# Patient Record
Sex: Male | Born: 1980
Health system: Southern US, Community
[De-identification: ages and names within clinical notes are randomized; demographics above are authoritative.]

## PROBLEM LIST (undated history)

## (undated) ENCOUNTER — Emergency Department (HOSPITAL_BASED_OUTPATIENT_CLINIC_OR_DEPARTMENT_OTHER): Discharge status: Absent without leave | Payer: Self-pay

## (undated) DIAGNOSIS — G4730 Sleep apnea, unspecified: Secondary | ICD-10-CM

## (undated) DIAGNOSIS — G8929 Other chronic pain: Secondary | ICD-10-CM

## (undated) DIAGNOSIS — I82409 Acute embolism and thrombosis of unspecified deep veins of unspecified lower extremity: Secondary | ICD-10-CM

## (undated) DIAGNOSIS — F209 Schizophrenia, unspecified: Secondary | ICD-10-CM

## (undated) DIAGNOSIS — F319 Bipolar disorder, unspecified: Secondary | ICD-10-CM

## (undated) DIAGNOSIS — M25512 Pain in left shoulder: Secondary | ICD-10-CM

## (undated) DIAGNOSIS — C6290 Malignant neoplasm of unspecified testis, unspecified whether descended or undescended: Secondary | ICD-10-CM

## (undated) DIAGNOSIS — J45909 Unspecified asthma, uncomplicated: Secondary | ICD-10-CM

## (undated) HISTORY — PX: APPENDECTOMY: SHX54

## (undated) HISTORY — PX: FACIAL COSMETIC SURGERY: SHX629

---
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
## 2001-04-17 ENCOUNTER — Encounter: Payer: Self-pay | Admitting: Emergency Medicine

## 2001-04-17 ENCOUNTER — Emergency Department (HOSPITAL_COMMUNITY): Admission: EM | Admit: 2001-04-17 | Discharge: 2001-04-17 | Payer: Self-pay | Admitting: Emergency Medicine

## 2001-08-30 ENCOUNTER — Emergency Department (HOSPITAL_COMMUNITY): Admission: EM | Admit: 2001-08-30 | Discharge: 2001-08-30 | Payer: Self-pay | Admitting: Emergency Medicine

## 2001-11-07 ENCOUNTER — Encounter: Payer: Self-pay | Admitting: Emergency Medicine

## 2001-11-07 ENCOUNTER — Emergency Department (HOSPITAL_COMMUNITY): Admission: EM | Admit: 2001-11-07 | Discharge: 2001-11-07 | Payer: Self-pay | Admitting: Emergency Medicine

## 2001-11-09 ENCOUNTER — Inpatient Hospital Stay (HOSPITAL_COMMUNITY): Admission: RE | Admit: 2001-11-09 | Discharge: 2001-11-10 | Payer: Self-pay | Admitting: Oral & Maxillofacial Surgery

## 2001-11-10 ENCOUNTER — Encounter: Payer: Self-pay | Admitting: Oral & Maxillofacial Surgery

## 2002-02-17 ENCOUNTER — Encounter: Payer: Self-pay | Admitting: Emergency Medicine

## 2002-02-17 ENCOUNTER — Emergency Department (HOSPITAL_COMMUNITY): Admission: EM | Admit: 2002-02-17 | Discharge: 2002-02-17 | Payer: Self-pay | Admitting: Emergency Medicine

## 2002-03-07 ENCOUNTER — Encounter: Admission: RE | Admit: 2002-03-07 | Discharge: 2002-03-07 | Payer: Self-pay | Admitting: Nephrology

## 2002-03-07 ENCOUNTER — Encounter: Payer: Self-pay | Admitting: Nephrology

## 2002-03-10 ENCOUNTER — Encounter: Admission: RE | Admit: 2002-03-10 | Discharge: 2002-04-21 | Payer: Self-pay | Admitting: Orthopaedic Surgery

## 2002-07-12 ENCOUNTER — Encounter: Payer: Self-pay | Admitting: Nephrology

## 2002-07-12 ENCOUNTER — Encounter: Admission: RE | Admit: 2002-07-12 | Discharge: 2002-07-12 | Payer: Self-pay | Admitting: Nephrology

## 2002-08-12 ENCOUNTER — Encounter: Payer: Self-pay | Admitting: Orthopedic Surgery

## 2002-08-12 ENCOUNTER — Encounter: Admission: RE | Admit: 2002-08-12 | Discharge: 2002-08-12 | Payer: Self-pay | Admitting: Orthopedic Surgery

## 2002-09-01 ENCOUNTER — Encounter: Admission: RE | Admit: 2002-09-01 | Discharge: 2002-09-01 | Payer: Self-pay | Admitting: Nephrology

## 2002-09-01 ENCOUNTER — Encounter: Payer: Self-pay | Admitting: Nephrology

## 2003-01-19 ENCOUNTER — Emergency Department (HOSPITAL_COMMUNITY): Admission: EM | Admit: 2003-01-19 | Discharge: 2003-01-20 | Payer: Self-pay | Admitting: Emergency Medicine

## 2003-01-19 ENCOUNTER — Encounter: Payer: Self-pay | Admitting: Emergency Medicine

## 2004-03-24 ENCOUNTER — Emergency Department (HOSPITAL_COMMUNITY): Admission: EM | Admit: 2004-03-24 | Discharge: 2004-03-25 | Payer: Self-pay | Admitting: Emergency Medicine

## 2004-09-17 ENCOUNTER — Emergency Department (HOSPITAL_COMMUNITY): Admission: EM | Admit: 2004-09-17 | Discharge: 2004-09-18 | Payer: Self-pay | Admitting: Emergency Medicine

## 2004-10-09 ENCOUNTER — Emergency Department (HOSPITAL_COMMUNITY): Admission: EM | Admit: 2004-10-09 | Discharge: 2004-10-09 | Payer: Self-pay | Admitting: Emergency Medicine

## 2004-12-29 ENCOUNTER — Emergency Department (HOSPITAL_COMMUNITY): Admission: EM | Admit: 2004-12-29 | Discharge: 2004-12-29 | Payer: Self-pay | Admitting: Emergency Medicine

## 2005-09-21 ENCOUNTER — Emergency Department (HOSPITAL_COMMUNITY): Admission: EM | Admit: 2005-09-21 | Discharge: 2005-09-21 | Payer: Self-pay | Admitting: *Deleted

## 2005-10-27 ENCOUNTER — Emergency Department (HOSPITAL_COMMUNITY): Admission: EM | Admit: 2005-10-27 | Discharge: 2005-10-27 | Payer: Self-pay | Admitting: Emergency Medicine

## 2005-10-28 ENCOUNTER — Emergency Department (HOSPITAL_COMMUNITY): Admission: EM | Admit: 2005-10-28 | Discharge: 2005-10-28 | Payer: Self-pay | Admitting: Emergency Medicine

## 2005-12-28 ENCOUNTER — Emergency Department (HOSPITAL_COMMUNITY): Admission: EM | Admit: 2005-12-28 | Discharge: 2005-12-28 | Payer: Self-pay | Admitting: Emergency Medicine

## 2006-03-04 ENCOUNTER — Emergency Department (HOSPITAL_COMMUNITY): Admission: EM | Admit: 2006-03-04 | Discharge: 2006-03-04 | Payer: Self-pay | Admitting: Emergency Medicine

## 2006-09-14 ENCOUNTER — Emergency Department (HOSPITAL_COMMUNITY): Admission: EM | Admit: 2006-09-14 | Discharge: 2006-09-14 | Payer: Self-pay | Admitting: Family Medicine

## 2006-09-14 ENCOUNTER — Emergency Department (HOSPITAL_COMMUNITY): Admission: EM | Admit: 2006-09-14 | Discharge: 2006-09-14 | Payer: Self-pay | Admitting: Emergency Medicine

## 2006-11-25 ENCOUNTER — Emergency Department (HOSPITAL_COMMUNITY): Admission: EM | Admit: 2006-11-25 | Discharge: 2006-11-25 | Payer: Self-pay | Admitting: Emergency Medicine

## 2007-07-02 ENCOUNTER — Emergency Department (HOSPITAL_COMMUNITY): Admission: EM | Admit: 2007-07-02 | Discharge: 2007-07-02 | Payer: Self-pay | Admitting: Emergency Medicine

## 2007-07-18 ENCOUNTER — Emergency Department (HOSPITAL_COMMUNITY): Admission: EM | Admit: 2007-07-18 | Discharge: 2007-07-18 | Payer: Self-pay | Admitting: Emergency Medicine

## 2007-08-20 ENCOUNTER — Emergency Department (HOSPITAL_COMMUNITY): Admission: EM | Admit: 2007-08-20 | Discharge: 2007-08-20 | Payer: Self-pay | Admitting: Emergency Medicine

## 2010-04-14 ENCOUNTER — Emergency Department (HOSPITAL_COMMUNITY): Admission: EM | Admit: 2010-04-14 | Discharge: 2010-04-14 | Payer: Self-pay | Admitting: Emergency Medicine

## 2010-05-03 ENCOUNTER — Emergency Department (HOSPITAL_COMMUNITY): Admission: EM | Admit: 2010-05-03 | Discharge: 2010-05-03 | Payer: Self-pay | Admitting: Emergency Medicine

## 2010-05-17 ENCOUNTER — Encounter: Admission: RE | Admit: 2010-05-17 | Discharge: 2010-05-17 | Payer: Self-pay | Admitting: Orthopedic Surgery

## 2010-06-20 ENCOUNTER — Encounter: Admission: RE | Admit: 2010-06-20 | Discharge: 2010-07-18 | Payer: Self-pay | Admitting: Orthopedic Surgery

## 2010-10-26 ENCOUNTER — Emergency Department (HOSPITAL_COMMUNITY)
Admission: EM | Admit: 2010-10-26 | Discharge: 2010-10-26 | Payer: Self-pay | Source: Home / Self Care | Admitting: Emergency Medicine

## 2010-11-06 ENCOUNTER — Emergency Department (HOSPITAL_COMMUNITY)
Admission: EM | Admit: 2010-11-06 | Discharge: 2010-11-06 | Payer: Self-pay | Source: Home / Self Care | Admitting: Family Medicine

## 2010-12-27 ENCOUNTER — Inpatient Hospital Stay (INDEPENDENT_AMBULATORY_CARE_PROVIDER_SITE_OTHER)
Admission: RE | Admit: 2010-12-27 | Discharge: 2010-12-27 | Disposition: A | Discharge status: Home / Self Care | Payer: Self-pay | Source: Ambulatory Visit | Attending: Family Medicine | Admitting: Family Medicine

## 2010-12-27 DIAGNOSIS — J309 Allergic rhinitis, unspecified: Secondary | ICD-10-CM

## 2010-12-27 DIAGNOSIS — J45909 Unspecified asthma, uncomplicated: Secondary | ICD-10-CM

## 2011-01-06 LAB — DIFFERENTIAL
Basophils Absolute: 0.1 10*3/uL (ref 0.0–0.1)
Basophils Relative: 1 % (ref 0–1)
Eosinophils Absolute: 0.1 10*3/uL (ref 0.0–0.7)
Eosinophils Relative: 1 % (ref 0–5)
Lymphocytes Relative: 25 % (ref 12–46)
Lymphs Abs: 2.2 10*3/uL (ref 0.7–4.0)
Monocytes Absolute: 0.7 10*3/uL (ref 0.1–1.0)
Monocytes Relative: 8 % (ref 3–12)
Neutro Abs: 5.9 10*3/uL (ref 1.7–7.7)
Neutrophils Relative %: 66 % (ref 43–77)

## 2011-01-06 LAB — CBC
HCT: 44.5 % (ref 39.0–52.0)
Hemoglobin: 15.2 g/dL (ref 13.0–17.0)
MCH: 30.1 pg (ref 26.0–34.0)
MCHC: 34.2 g/dL (ref 30.0–36.0)
MCV: 88.1 fL (ref 78.0–100.0)
Platelets: 285 10*3/uL (ref 150–400)
RBC: 5.05 MIL/uL (ref 4.22–5.81)
RDW: 12.6 % (ref 11.5–15.5)
WBC: 9 10*3/uL (ref 4.0–10.5)

## 2011-01-06 LAB — POCT CARDIAC MARKERS
CKMB, poc: 1.5 ng/mL (ref 1.0–8.0)
Myoglobin, poc: 63.6 ng/mL (ref 12–200)
Troponin i, poc: <0.05 ng/mL (ref 0.00–0.09)

## 2011-01-06 LAB — POCT I-STAT, CHEM 8
BUN: 13 mg/dL (ref 6–23)
Calcium, Ion: 1.16 mmol/L (ref 1.12–1.32)
Chloride: 106 mEq/L (ref 96–112)
Creatinine, Ser: 1 mg/dL (ref 0.4–1.5)
Glucose, Bld: 115 mg/dL — ABNORMAL HIGH (ref 70–99)
HCT: 49 % (ref 39.0–52.0)
Hemoglobin: 16.7 g/dL (ref 13.0–17.0)
Potassium: 3.9 mEq/L (ref 3.5–5.1)
Sodium: 142 mEq/L (ref 135–145)
TCO2: 27 mmol/L (ref 0–100)

## 2011-02-16 ENCOUNTER — Inpatient Hospital Stay (INDEPENDENT_AMBULATORY_CARE_PROVIDER_SITE_OTHER)
Admission: RE | Admit: 2011-02-16 | Discharge: 2011-02-16 | Disposition: A | Discharge status: Home / Self Care | Payer: Self-pay | Source: Ambulatory Visit

## 2011-02-16 DIAGNOSIS — L84 Corns and callosities: Secondary | ICD-10-CM

## 2011-02-16 DIAGNOSIS — L03019 Cellulitis of unspecified finger: Secondary | ICD-10-CM

## 2011-03-14 NOTE — Op Note (Signed)
[place]. [facility]  Patient:    Douglas Sloan, Douglas Sloan Visit Number: [id number] MRN: [id number]          Service Type: SUR Location: [id number] Attending Physician:  Sammuel Bailiff Dictated by:   Dorthula Matas, D.D.S. Proc. Date: 11/09/01 Admit Date:  11/09/2001                             Operative Report  PREOPERATIVE DIAGNOSIS:  Left zygoma (zygomaticomaxillary complex) fracture and left Jerry Caras I fracture.  POSTOPERATIVE DIAGNOSIS:  Left zygoma (zygomaticomaxillary complex) fracture and left Jerry Caras I fracture.  OPERATION:  Application of Erich arch bars, open reduction and internal fixation of the [facility] I and of the zygoma fractures with rigid internal fixation being placed.  SURGEON:  Dorthula Matas, D.D.S.  ANESTHESIA:  General anesthesia via nasal endotracheal tube via the right nares.  SPECIMENS:  None.  COMPLICATIONS:  None.  DRAINS:  None.  CULTURES:  None.  DESCRIPTION OF PROCEDURE:  The patient was brought to the operating room and placed on the operating table in the supine position.  He was then placed under general anesthesia and nasal endotracheal tube was inserted through the right nares.  The patient was then prepped and draped in a sterile manner for an oral maxillofacial surgical procedure for reduction of the zygoma and Le Fort fractures.  The patient was prepped for intraoral and possibly extraoral access.  At this point, 0.5% Marcaine with 1:200,000 epinephrine was infiltrated in the left maxillary buccal vestibule from the midline of the maxilla back to the posterior and superior alveolar nerve region.  Also, a left greater palatine nerve block was administered.  At this stage, I attempted to manipulate the maxilla with the Rowe disimpaction forceps to see if I could correct the patients malocclusion.  The patient was noted to be touching only on his left posterior second molar.  He had an open bite  throughout the rest of his occlusion.  Manipulation with Rowe disimpaction forceps did not prove to be successful for reduction of the occlusal abnormality.  The patient then had Erich arch bars applied to the maxillary teeth and to the mandibular teeth using circumdental wiring.  A 24-gauge wire was used for the canine posteriorly and in the incisor region, 26-gauge circumdental wires were placed.  At completion of this, I then made an incision in the unattached mucosa and buccal vestibule starting in the approximate region of the left zygomatic buttress and extending to the midline of the maxilla through the maxillary frenum.  A periosteal elevator was used to release the full thickness mucoperiosteal flap and reflect this flap superiorly.  As I reflected the flap superiorly, it was noted that there was a fracture that went through the intraorbital neuroforamen and that the zygoma had been pressed inwardly and rotated inwardly as well.  This caused impingement of the infraorbital nerve. There was also shattering of the left maxillary antral wall and there were two floating pieces of bone in the antrum.  There also was a fracture that extended anteriorly and appeared to go through the canine and premolar area of the left maxilla.  The left maxilla had been impacted medially and this was causing the posterior maxillary molar to be pushed downward and thus causing interference.  There was also a horizontal fracture that ran along the medial sinus wall posteriorly, and there was a fragment of  the zygomatic buttress that essentially was wedged between the zygoma fracture and the [facility] I fracture.  At this point, I placed a Tessier disimpaction forceps, and attempted to manipulate the maxillary fracture.  I then also used an elevator to go up underneath the zygomatic buttress and into the infratemporal fossa region to manipulate the zygoma laterally as well as anteriorly.  With the use of  this elevator, I was able to free up the zygoma, and to move it anteriorly and laterally along it medial extent of the fracture in the infraorbital nerve area.  This released pressure from the maxillary fracture and allowed the posterior maxilla to be moved superiorly.  Once this was done, I copiously irrigated the maxillary sinus, and then with saline, suctioned it free of debris.  I then placed the patient into intermaxillary fixation using three 26-gauge wire loops to secure the maxilla to the mandible.  With this done, I then manipulated the [facility] fracture and the zygoma fracture into alignment.  I then used four separate bone plates to secure the zygoma fracture and the maxillary Jerry Caras I fracture into their proper places and for stabilization.  All of the bone plates were 1 mm bone plates from the KLS set.  In each of the bone plates, there were numerous screws placed for fixation.  The patient beneath the ________ had a semilunar bone plate which had multiple holes.  In the zygomatic buttress area, an L-shaped bone plate was used.  In the piriform rim area, a double-Y bone was used and along the maxillary anterior wall, a box designed plate was used.  Once this was completed, again the sinus region was copiously irrigated with normal saline and suctioned free of debris.  There were two small bone screws that had the body of the screw shear off and the head sheared off from them, and these were left in the bone.  Also, one of the bone plate fragments were floating in the sinus that were identified, was removed, and another one was found later, and it was removed.  This created an area of opening into the maxillary sinus on the left side that measured approximately 1 cm in size by 1 cm.  At the completion of the bone plate fixation phase, intermaxillary fixation was released, and the occlusion was checked.  The occlusion was noted to be much improved, and the patient came into  occlusion fairly nicely.  It was noted that he had a cross bite-type of occlusion on the left side in the  premolar and first molar areas.  The patient did have good overjet and overbite noted at this time.  At this stage, I then closed the maxillary incision with 4-0 Vicryl suture, and this was done in a running continuous fashion.  I then irrigated out the oral cavity, suctioned it free of debris, and removed the throat pack.  The patient was placed into elastic intermaxillary fixation, and this was left in place while he was extubated and in the PACU area.  I also placed a finger splint externally so that the patient would not put pressure over the left zygoma.  At the completion of the case, I felt extraorally along the inferior rim of the left orbit and no step was noted, and again, also felt along the lateral orbital rim area, and again, no step was noted.  Due to this, and due to the good stabilization that we were able to obtain, I did not have  to make any extraoral incisions.  Again, the patient was brought to the PACU where he was noted to be satisfactory postoperative condition. Dictated by:   Dorthula Matas, D.D.S. Attending Physician:  Sammuel Bailiff DD:  11/09/01 TD:  11/10/01 Job: [id number]
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
## 2011-03-14 NOTE — H&P (Signed)
[place]. [facility]  Patient:    Douglas Sloan, Douglas Sloan                          MRN: [id number] Adm. Date:  [id number] Attending:  Sela Hua                         History and Physical  CHIEF COMPLAINT:  Abdominal pain.  HISTORY OF PRESENT ILLNESS:  Mr. Olund is an 30 year old male who had some periumbilical sharp pain a couple of days ago.  It felt like he needed to eat. The pain has progressed.  He tried Maalox which did not help.  He also tried some Lomotil.  The pain radiated down to the right lower quadrant.  He did initially have some diarrhea, but this has subsided.  He had no fever, chills, or vomiting, but does not have an appetite.  PAST MEDICAL HISTORY:  Asthma.  PAST SURGICAL HISTORY:  None.  MEDICATIONS:  Albuterol inhaler p.r.n.  ALLERGIES:  No known drug allergies.  SOCIAL HISTORY:  He is a Holiday representative at [employer].  He denies tobacco or alcohol use.  FAMILY HISTORY:  No chronic illnesses in the immediate family.  REVIEW OF SYSTEMS:  CARDIOVASCULAR: No known heart disease.  PULMONARY: Except for the asthma, no other lung disease.  GASTROINTESTINAL: No peptic ulcer disease, hepatitis, or colitis.  GENITOURINARY: No kidney stones. NEUROLOGICAL: No childhood seizures.  HEMATOLOGIC:  No bleeding disorder, sickle cell, or DVT.  PHYSICAL EXAMINATION:  GENERAL:  Uncomfortable-appearing male.  VITAL SIGNS:  Temperature 98.8, blood pressure 142/61, pulse 77.  HEENT:  Eyes; extraocular muscles intact.  Sclerae clear without icteris.  NECK:  No palpable masses.  HEART:  Regular rate and rhythm with no murmur.  LUNGS:  Respiratory breath sounds equal and clear bilaterally with respirations nonlabored.  ABDOMEN:  Slightly firm with tenderness to both palpation and percussion in the right lower quadrant and in the suprapubic area.  There is guarding there as well.  EXTREMITIES:  No clubbing, cyanosis, or edema.  LABORATORY DATA:   White blood cell count 14,900, with hemoglobin of 14.4. Urinalysis unremarkable.  CT scan ordered by the emergency department physician demonstrates appendiceal inflammation, swelling, and an appendicolith all consistent with appendicitis.  IMPRESSION:  Acute appendicitis.  PLAN:  Laparoscopic possible open appendectomy.  The procedure and risks including, but not limited to bleeding, infection, intra-abdominal organ damage, and anesthesia were explained to him.  He seemed to understand and agrees to proceed. DD:  08/25/00 TD:  08/25/00 Job: [id number]

## 2011-03-14 NOTE — Consult Note (Signed)
NAME:  Douglas Sloan, Douglas Sloan                           ACCOUNT NO.:  [id number]   MEDICAL RECORD NO.:  [id number]                   PATIENT TYPE:  EMS   LOCATION:  ED                                   FACILITY:  [facility]   PHYSICIAN:  Deanna Artis. Sharene Skeans, M.D.           DATE OF BIRTH:  05/30/1981   DATE OF CONSULTATION:  03/25/2004  DATE OF DISCHARGE:                                   CONSULTATION   I was asked by Dr. Carleene Cooper, ER doctor at [facility], to see  Douglas Sloan, a 30 year old with chief complaint of inability to move left  arm.   The patient works as a Public affairs consultant.  The patient accidentally  tripped over a gasket and fell, reaching out with his right arm to break his  fall but falling on the outstretched left arm.  He did not initially have  any pain in the arm and seemed to be moving it well.  The patient had a few  beers and went to bed without any deficits.   This morning he awakened and said that numbness was present in the left arm,  and it seemed to climb up to the region above the deltoid in the  circumferential manner.  This was associated with some pain in his elbow and  inability to lift his arm.   The patient has no pain in his neck and no loss of range of motion, no pain  in the supraclavicular region, no pain in his glenohumeral joint, his  acromioclavicular joint, or in the scapular region.  I can find no localized  tenderness or swelling in the shoulder, the elbow, the wrist, or the hands,   The patient has not had any injury of this type before.   REVIEW OF SYSTEMS:  The patient has otherwise felt well.  He has not had any  intercurrent infections.  He has no pain.  Twelve system review is otherwise  unremarkable.  The patient cannot remember sleeping awkwardly.  He has not  hurt himself at work.   PAST MEDICAL HISTORY:  Positive for asthma.   PAST SURGICAL HISTORY:  Facial surgery and appendectomy.   CURRENT MEDICATIONS:  None.   ALLERGIES:  None.   SOCIAL HISTORY:  The patient smokes 12 cigarettes per day.  He drinks an  occasional beer.  He has a girlfriend.  He works as a Public affairs consultant  and hopes to go to [employer] to become a Ambulance person.  He  tells me that his boss has allowed him to stay out of work until such time  as he can use his arm.   The patient has been evaluated tonight at [facility].  He arrived  around 11:05 p.m. after having his symptoms all day long.  He was seen first  by the P.A. and then by Dr. Carleene Cooper.  X-ray of the left arm including  the forearm, humerus, shoulder was negative.  C spine negative.  Chest x-ray  negative.  I recommended a CT scan of the brain without contrast which is  also normal.   The patient was seen by Dr. Amanda Pea who found no physical abnormalities, no  signs of a compartment syndrome, no skin lesions or abrasions in the left  upper extremity.  He found normal strength in his triceps, wrist extensors  and flexors and intrinsic muscles of the hand.  He found that the patient  had 0/5 in the thenar muscles, deltoid, biceps, and brachial radialis.  His  impression was that the patient either had injury to the median, axillary,  or musculocutaneous nerve in the C5-6 dermatome.  He felt that the median,  ulnar, and radial nerves were intact.   I was asked to see the patient to evaluate his condition and make  recommendations for further workup and treatment.   PHYSICAL EXAMINATION:  VITAL SIGNS:  Temperature 100.3, pulse 94,  respirations 20, blood pressure 146/96, pulse oximetry 96%.  HEENT:  No signs of infection.  NECK:  Supple.  Full range of motion.  No cranial or cervical bruits.  No  localized tenderness in the head and neck region, scapular region,  supraclavicular region, or anywhere in the left arm that I could see.  There  was no swelling, no compartment syndrome, no localized tenderness, no change  in the skin.  LUNGS:   Clear to auscultation.  HEART:  No murmurs, pulses normal.  ABDOMEN:  Soft, nontender.  Bowel sounds normal.  EXTREMITIES:  Well formed without edema or cyanosis.  NEUROLOGIC:  Mental Status:  The patient was awake, alert, attentive,  appropriate, somewhat subdued.  Cranial Nerves:  Round, reactive pupils.  No  signs of Horner's syndrome.  Extraocular movements full and conjugate.  Visual fields full.  Symmetric facial strength.  Midline tongue and uvula.  Air conduction greater than bone conduction.  He was able to protrude his  tongue and elevate uvula in midline.  There was no abnormal sensation in the  head and neck.  Motor examination:  Both legs and the right arm were normal.  In the left arm, he was able to grip tightly with his hand, extend his  fingers, and show good intrinsic flexion and extension of his fingers and  also abduction and adduction.  He as able to oppose his thumb with his  fingers which he was not able to do for Dr. Amanda Pea.  I had him then work  with the proximal arm.  At first he said he could not elevate it.  Then he  was able to keep the arm up at the level of the deltoid and showed power of  at least 4/5.  He said he could not move his biceps, but he was able to  bring the arm in and out at least in the plane of gravity.  I was able to  palpate the arm as he was moving it at least 3 and probably 4-/5 strength  there.  He has 5/5 in his triceps.  Sensation seemed to indicate a C6-C7  hypesthesia, seemed normal at C5.  This was with the hand supine.  When I  turned the hand over prone, those areas that had been numb suddenly were  sharp, and those that were sharp were numb.  As I turned it back over to  test it again, he appeared to catch on to the inconsistency and then became  more consistent with the exam.  The patient had good vibration that was  equal in his thumb and his pinky.  Gait was normal.  Reflexes were absent.  IMPRESSION:  Left arm weakness.  I  believe that this is functional and  embellished.  I think there is secondary gain from his inability to move the  arm.  At present, I have written a note excusing him from work until he  regains power in the arm.  I have told him to take Motrin 400 mg up to 4  times a day for pain.  We will put his arm in a sling.  I will get him some  Motrin tonight.  I have told him that I think the arm will be better within  a few days.  If it is not, I have asked him to give me a call.  We would not  be able to test for nerve conduction and EMGs until he has been at least two  weeks out from his injury.  I suspect strongly that he will show normal  strength by that time.  If he does not, we will set him up for an EMG and  nerve conduction study to evaluate his condition.  The presence of normal  bony structures, the absence of pain and tenderness in areas that either  could have been stretched or compressed with his fall, and the  inconsistencies in his exam make me strongly suspect that this is  functional.   I appreciate the opportunity to see him.  If you have questions or I can be  of assistance, do not hesitate to contact me.                                               Deanna Artis. Sharene Skeans, M.D.   [facility]  D:  03/25/2004  T:  03/25/2004  Job:  [id number]

## 2011-03-14 NOTE — Op Note (Signed)
[place]. [facility]  Patient:    MAVERIC, DEBONO                          MRN: [id number] Proc. Date: 08/25/00 Adm. Date:  [id number] Attending:  Arlis Porta                           Operative Report  PREOPERATIVE DIAGNOSIS:  Acute appendicitis.  POSTOPERATIVE DIAGNOSIS:  Acute appendicitis.  OPERATION PERFORMED:  Laparoscopic appendectomy.  SURGEON:  Adolph Pollack, M.D.  ASSISTANT:  Gwenlyn Perking, M.D.  ANESTHESIA:  General.  INDICATIONS FOR PROCEDURE:  This 30 year old male began having some diffuse abdominal pain and then it radiated to his right lower quadrant. He had an elevation in white blood cell count.  Emergency department physician ordered a CT scan which demonstrated findings consistent with appendicitis.  He is now brought to the operating room.  DESCRIPTION OF PROCEDURE:  He was placed supine on the operating table. General anesthetic was administered.  The abdomen was sterilely prepped and draped.  Local anesthetic was infiltrated in the subumbilical area and a subumbilical incision was made incising the skin and subcutaneous tissues sharply.  A 1 cm incision was made in the midline fascia.  A pursestring suture of 0 Vicryl was placed  around the fascial edges.  The peritoneal cavity was then entered sharply and under direct vision.  A Hasson trocar was introduced into the peritoneal cavity and pneumoperitoneum created by insufflation of CO2 gas.  The laparoscope was then introduced into the peritoneal cavity.  The right lower quadrant area and cecum were identified and an inflamed-looking appendix was noted adherent to the lateral and posterior side side wall and posterior walls of the peritoneal cavity.  Next, a 5 mm trocar was placed in the left lower quadrant and one in the right upper quadrant.  The appendix was bluntly freed up from its attachments to the side and posterior walls and then the mesoappendix  divided with the Harmonic scalpel.  This was performed all the way down to the base of the appendix.  The base of the appendix was then amputated from the cecum using the endo GIA stapler.  The appendix was placed in an Endopouch bag and removed from the abdominal cavity.  Next the pericecal area was irrigated.  Some bleeding was noted from the area where the appendix had been amputated.  Bleeding was controlled by applying Hemoclips to this area.  1.5L of normal saline irrigation was used.  No bleeding was noted at the end of this.  The trocars were then removed under direct vision and the fascial defect repaired after the pneumoperitoneum was released by tightening up and tying down the pursestring suture.  The subumbilical area incision was cleaned with Betadine and irrigated.  The skin incisions were then closed with 4-0 Monocryl subcuticular stitches followed by Steri-Strips and sterile dressings.  The patient tolerated the procedure well without any apparent complications and was taken to the recovery room in satisfactory condition. DD:  08/25/00 TD:  08/25/00 Job: [id number]

## 2011-03-30 ENCOUNTER — Emergency Department: Payer: Self-pay | Admitting: Unknown Physician Specialty

## 2011-07-08 ENCOUNTER — Emergency Department: Payer: Self-pay | Admitting: Emergency Medicine

## 2011-08-07 ENCOUNTER — Inpatient Hospital Stay (INDEPENDENT_AMBULATORY_CARE_PROVIDER_SITE_OTHER)
Admission: RE | Admit: 2011-08-07 | Discharge: 2011-08-07 | Disposition: A | Discharge status: Home / Self Care | Payer: Self-pay | Source: Ambulatory Visit | Attending: Emergency Medicine | Admitting: Emergency Medicine

## 2011-08-07 ENCOUNTER — Ambulatory Visit (INDEPENDENT_AMBULATORY_CARE_PROVIDER_SITE_OTHER): Payer: Self-pay

## 2011-08-07 DIAGNOSIS — M65849 Other synovitis and tenosynovitis, unspecified hand: Secondary | ICD-10-CM

## 2011-08-07 DIAGNOSIS — M65839 Other synovitis and tenosynovitis, unspecified forearm: Secondary | ICD-10-CM

## 2011-08-08 LAB — GC/CHLAMYDIA PROBE AMP, GENITAL: Chlamydia, DNA Probe: NEGATIVE

## 2011-08-08 LAB — URINALYSIS, ROUTINE W REFLEX MICROSCOPIC
Bilirubin Urine: NEGATIVE
Glucose, UA: NEGATIVE
Hgb urine dipstick: NEGATIVE
Ketones, ur: NEGATIVE
Nitrite: NEGATIVE
Protein, ur: NEGATIVE
Specific Gravity, Urine: 1.027
Urobilinogen, UA: 1
pH: 6

## 2011-08-08 LAB — RPR: RPR Ser Ql: NONREACTIVE

## 2012-01-05 ENCOUNTER — Emergency Department (HOSPITAL_COMMUNITY): Admission: EM | Admit: 2012-01-05 | Discharge: 2012-01-05 | Discharge status: Against Medical Advice | Payer: Self-pay

## 2012-01-06 ENCOUNTER — Emergency Department (HOSPITAL_COMMUNITY)
Admission: EM | Admit: 2012-01-06 | Discharge: 2012-01-06 | Disposition: A | Discharge status: Home / Self Care | Payer: Self-pay | Attending: Emergency Medicine | Admitting: Emergency Medicine

## 2012-01-06 ENCOUNTER — Encounter (HOSPITAL_COMMUNITY): Payer: Self-pay

## 2012-01-06 ENCOUNTER — Emergency Department (HOSPITAL_COMMUNITY): Payer: Self-pay

## 2012-01-06 DIAGNOSIS — M79609 Pain in unspecified limb: Secondary | ICD-10-CM | POA: Insufficient documentation

## 2012-01-06 DIAGNOSIS — R209 Unspecified disturbances of skin sensation: Secondary | ICD-10-CM | POA: Insufficient documentation

## 2012-01-06 DIAGNOSIS — M25532 Pain in left wrist: Secondary | ICD-10-CM

## 2012-01-06 DIAGNOSIS — M25539 Pain in unspecified wrist: Secondary | ICD-10-CM | POA: Insufficient documentation

## 2012-01-06 DIAGNOSIS — M79671 Pain in right foot: Secondary | ICD-10-CM

## 2012-01-06 MED ORDER — HYDROCODONE-ACETAMINOPHEN 5-325 MG PO TABS
1.0000 | ORAL_TABLET | Freq: Once | ORAL | Status: AC
  Administered 2012-01-06: 1 via ORAL
  Filled 2012-01-06: qty 1

## 2012-01-06 MED ORDER — HYDROCODONE-ACETAMINOPHEN 5-325 MG PO TABS: 1.0000 | ORAL_TABLET | ORAL | Status: AC | PRN

## 2012-01-06 MED ORDER — NAPROXEN 500 MG PO TABS: 500.0000 mg | ORAL_TABLET | Freq: Two times a day (BID) | ORAL | Status: DC

## 2012-01-06 MED ORDER — PREDNISONE 10 MG PO TABS: 40.0000 mg | ORAL_TABLET | Freq: Every day | ORAL | Status: DC

## 2012-01-06 NOTE — ED Provider Notes (Signed)
Medical screening examination/treatment/procedure(s) were performed by non-physician practitioner and as supervising physician I was immediately available for consultation/collaboration.   Dayton Bailiff, MD 01/06/12 1105

## 2012-01-06 NOTE — ED Notes (Signed)
Patient here with ongoing left hand pain with any use. Reports that he thinks its carpal tunnel and has used splints in the past, also wants right foot checked-lateral aspect. Denies injury but reports pain with ambulation

## 2012-01-06 NOTE — ED Provider Notes (Signed)
History     CSN: [id number]  Arrival date & time 01/06/12  4098   First MD Initiated Contact with Patient 01/06/12 [phone]      No chief complaint on file.   (Consider location/radiation/quality/duration/timing/severity/associated sxs/prior treatment) The history is provided by the patient.   31 year old male with no significant past medical history presents to the emergency department with chief complaint of left wrist pain as well as right foot pain.  He reports intermittent left wrist pain for several years "every time it rains". 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 There has been no new injury. There is associated intermittent tingling of the 5th digit of the affected extremity without weakness or numbness at time of examination. Pain is worse with movement. He has taken ibuprofen without relief of the pain. He has been seen and evaluated for this in the past without a specific diagnosis, that he has worn wrist braces previously that he cannot locate at this time. Pt is ambidextrous. The right foot pain is a new problem. He was out drinking Saturday evening and woke up Sunday morning with pain to the lateral aspect of the right forefoot. He does not recall a specific injury. Pain worse with ambulation. Better with rest. There is been no prior treatment for this.  History reviewed. No pertinent past medical history.  History reviewed. No pertinent past surgical history.  No family history on file.  History  Substance Use Topics  . Smoking status: Not on file  . Smokeless tobacco: Not on file  . Alcohol Use: Yes      Review of Systems  Constitutional: Negative for fever and chills.  Musculoskeletal:       See history of present illness, otherwise negative  Skin: Negative for color change, rash and wound.  Neurological: Negative for weakness and numbness.    Allergies  Review of patient's  allergies indicates no known allergies.  Home Medications   Current Outpatient Rx  Name Route Sig Dispense Refill  . ALBUTEROL SULFATE HFA 108 (90 BASE) MCG/ACT IN AERS Inhalation Inhale 2 puffs into the lungs every 6 (six) hours as needed. For shortness of breath      BP 132/72  Pulse 100  Temp 98.5 F (36.9 C)  Resp 16  SpO2 99%  Physical Exam  Nursing note and vitals reviewed. Constitutional: He is oriented to person, place, and time. He appears well-developed and well-nourished. No distress.  HENT:  Head: Normocephalic and atraumatic.  Eyes: Pupils are equal, round, and reactive to light.  Neck: Normal range of motion. Neck supple.  Cardiovascular: Normal rate and regular rhythm.   Pulmonary/Chest: No respiratory distress.  Musculoskeletal: Normal range of motion. He exhibits tenderness. He exhibits no edema.       Left wrist: He exhibits tenderness. He exhibits normal range of motion, no bony tenderness, no swelling and no deformity.       Right ankle: No lateral malleolus, no medial malleolus, no posterior TFL and no proximal fibula tenderness found. Achilles tendon normal.       Right foot: He exhibits tenderness. He exhibits normal range of motion, no swelling and no deformity.       Feet:       There is moderate pain to palpation of the medial left wrist over the soft tissue just distal to the ulnar styloid. Negative phalen's and tinel's sign to the affected  extremity. No TTP over cubital fossa. Bilateral grip strength, finger abduction strength, finger-thumb opposition 5/5 and symmetric bilaterally. There is pain to palpation without deformity over the head of the fifth metatarsal. Full ROM with good strength to all digits of the foot with 5/5 and symmetric foot plantar/dorsi flexion.  Neurological: He is alert and oriented to person, place, and time.       Slightly decreased sensation to light touch in only the fifth digit of the left hand when compared to the right    Skin: Skin is warm and dry. No rash noted. No erythema.  Psychiatric: He has a normal mood and affect.    ED Course  Procedures (including critical care time)  Labs Reviewed - No data to display Dg Foot Complete Right  01/06/2012  *RADIOLOGY REPORT*  Clinical Data: Possible injury.  Pain to the fifth metatarsal.  RIGHT FOOT COMPLETE - 3+ VIEW  Comparison: None.  Findings: There is no evidence of fracture or dislocation.  There is no evidence of arthropathy or other focal bone abnormality. Soft tissues are unremarkable.  IMPRESSION: Negative exam.  Original Report Authenticated By: Rosealee Albee, M.D.       MDM  1) Chronic recurrent left wrist pain. No bony TTP. Suspect some component of distal ulnar nerve irritation given slightly decreased sensation to light touch but no pain on percussion over cubital fossa. Pt will be given wrist splint in ED and advised to take NSAIDs with ortho f/u for continued eval.  2) Right foot pain. No acute findings on x-ray. No overlying skin changes. Suspect minor injury. Pt is given a post-op shoe in the ED.   He will be given a prescription for pain medication, a short course of steroid therapy to help alleviate inflammation around his nerve, and NSAIDs. Has been advised to obtain hand/ortho f/u if no symptom improvement.        Shaaron Adler, PA-C 01/06/12 1027

## 2012-01-06 NOTE — ED Notes (Signed)
Ortho paged  [facility] responded- aware of need

## 2012-01-06 NOTE — Discharge Instructions (Signed)
Take the prednisone daily for the next 5 days. Take the naproxen twice daily for at least the next 5 days, longer if you are still having pain. You can use the hydrocodone-acetaminophen for more severe pain.     Wrist Pain Wrist injuries are frequent in adults and children. A sprain is an injury to the ligaments that hold your bones together. A strain is an injury to muscle or muscle cord-like structures (tendons) from stretching or pulling. Generally, when wrists are moderately tender to touch following a fall or injury, a break in the bone (fracture) may be present. Most wrist sprains or strains are better in 3 to 5 days, but complete healing may take several weeks. HOME CARE INSTRUCTIONS   Put ice on the injured area.   Put ice in a plastic bag.   Place a towel between your skin and the bag.   Leave the ice on for 15 to 20 minutes, 3 to 4 times a day, for the first 2 days.   Keep your arm raised above the level of your heart whenever possible to reduce swelling and pain.   Rest the injured area for at least 48 hours or as directed by your caregiver.   If a splint or elastic bandage has been applied, use it for as long as directed by your caregiver or until seen by a caregiver for a follow-up exam.   Only take over-the-counter or prescription medicines for pain, discomfort, or fever as directed by your caregiver.   Keep all follow-up appointments. You may need to follow up with a specialist or have follow-up X-rays. Improvement in pain level is not a guarantee that you did not fracture a bone in your wrist. The only way to determine whether or not you have a broken bone is by X-ray.  SEEK IMMEDIATE MEDICAL CARE IF:   Your fingers are swollen, very red, white, or cold and blue.   Your fingers are numb or tingling.   You have increasing pain.   You have difficulty moving your fingers.  MAKE SURE YOU:   Understand these instructions.   Will watch your condition.   Will get  help right away if you are not doing well or get worse.  Document Released: 07/23/2005 Document Revised: 10/02/2011 Document Reviewed: 12/04/2010 [facility] Patient Information 2012 [place], [place].        Contusion (Bruise) of Foot Injury to the foot causes bruises (contusions). Contusions are caused by bleeding from small blood vessels that allow blood to leak out into the muscles, cord-like structures that attach muscle to bone (tendons), and/or other soft tissue.  CAUSES  Contusions of the foot are common. Bruises are frequently seen from:  Contact sports injuries.   The use of medications that thin the blood (anti-coagulants).   Aspirin and non-steroidal anti-inflammatory agents that decrease the clotting ability.   People with vitamin deficiencies.  SYMPTOMS  Signs of foot injury include pain and swelling. At first there may be discoloration from blood under the skin. This will appear blue to purple in color. As the bruise ages, the color turns yellow. Swelling may limit the movement of the toes.  Complications from foot injury may include:  Collections of blood leading to disability if calcium deposits form. These can later limit movement in the foot.   Infection of the foot if there are breaks in the skin.   Rupture of the tendons that may need surgical repair.  DIAGNOSIS  Diagnosing foot injuries can be made  by observation. If problems continue, X-rays may be needed to make sure there are no broken bones (fractures). Continuing problems may require physical therapy.  HOME CARE INSTRUCTIONS   Apply ice to the injury for 15 to 20 minutes, 3 to 4 times per day. Put the ice in a plastic bag and place a towel between the bag of ice and your skin.   An elastic wrap (like an Ace bandage) may be used to keep swelling down.   Keep foot elevated to reduce swelling and discomfort.   Try to avoid standing or walking while the foot is painful. Do not resume use until instructed by  your caregiver. Then begin use gradually. If pain develops, decrease use and continue the above measures. Gradually increase activities that do not cause discomfort until you slowly have normal use.   Only take over-the-counter or prescription medicines for pain, discomfort, or fever as directed by your caregiver. Use only if your caregiver has not given medications that would interfere.   Begin daily rehabilitation exercises when supportive wrapping is no longer needed.   Use ice massage for 10 minutes before and after workouts. Fill a large styrofoam cup with water and freeze. Tear a small amount of foam from the top so ice protrudes. Massage ice firmly over the injured area in a circle about the size of a softball.   Always eat a well balanced diet.   Follow all instructions for follow up with your caregiver, any orthopedic referrals, physical therapy and rehabilitation. Any delay in obtaining necessary care could result in delayed healing, and temporary or permanent disability.  SEEK IMMEDIATE MEDICAL CARE IF:   Your pain and swelling increase, or pain is uncontrolled with medications.   You have loss of feeling in your foot, or your foot turns cold or blue.   An oral temperature above 102 F (38.9 C) develops, not controlled by medication.   Your foot becomes warm to touch, or you have more pain with movement of your toes.   You have a foot contusion that does not improve in 1 or 2 days.   Skin is broken and signs of infection occur (drainage, increasing pain, fever, headache, muscle aches, dizziness or a general ill feeling).   You develop new, unexplained symptoms, or an increase of the symptoms that brought you to your caregiver.  MAKE SURE YOU:   Understand these instructions.   Will watch your condition.   Will get help right away if you are not doing well or get worse.  Document Released: 08/04/2006 Document Revised: 10/02/2011 Document Reviewed: 09/16/2011 [facility]  Patient Information 2012 [place], [place].

## 2012-04-21 ENCOUNTER — Encounter (HOSPITAL_COMMUNITY): Payer: Self-pay | Admitting: Emergency Medicine

## 2012-04-21 ENCOUNTER — Emergency Department (HOSPITAL_COMMUNITY)
Admission: EM | Admit: 2012-04-21 | Discharge: 2012-04-22 | Disposition: A | Discharge status: Home / Self Care | Payer: Self-pay | Attending: Emergency Medicine | Admitting: Emergency Medicine

## 2012-04-21 DIAGNOSIS — S92426A Nondisplaced fracture of distal phalanx of unspecified great toe, initial encounter for closed fracture: Secondary | ICD-10-CM

## 2012-04-21 DIAGNOSIS — F17200 Nicotine dependence, unspecified, uncomplicated: Secondary | ICD-10-CM | POA: Insufficient documentation

## 2012-04-21 DIAGNOSIS — IMO0002 Reserved for concepts with insufficient information to code with codable children: Secondary | ICD-10-CM | POA: Insufficient documentation

## 2012-04-21 DIAGNOSIS — S92919A Unspecified fracture of unspecified toe(s), initial encounter for closed fracture: Secondary | ICD-10-CM | POA: Insufficient documentation

## 2012-04-21 MED ORDER — OXYCODONE-ACETAMINOPHEN 5-325 MG PO TABS
1.0000 | ORAL_TABLET | Freq: Once | ORAL | Status: AC
  Administered 2012-04-22: 1 via ORAL
  Filled 2012-04-21: qty 1

## 2012-04-21 NOTE — ED Notes (Signed)
Patient states that he stubbed his toe earlier today and the pain has gotten worse since. He also reports pain to his left middle finger distal end

## 2012-04-22 ENCOUNTER — Emergency Department (HOSPITAL_COMMUNITY): Payer: Self-pay

## 2012-04-22 MED ORDER — OXYCODONE-ACETAMINOPHEN 5-325 MG PO TABS: 1.0000 | ORAL_TABLET | Freq: Once | ORAL | Status: DC

## 2012-04-22 MED ORDER — HYDROMORPHONE HCL PF 1 MG/ML IJ SOLN
1.0000 mg | Freq: Once | INTRAMUSCULAR | Status: AC
  Administered 2012-04-22: 1 mg via INTRAMUSCULAR
  Filled 2012-04-22: qty 1

## 2012-04-22 MED ORDER — ONDANSETRON 8 MG PO TBDP
8.0000 mg | ORAL_TABLET | Freq: Once | ORAL | Status: AC
  Administered 2012-04-22: 8 mg via ORAL
  Filled 2012-04-22: qty 1

## 2012-04-22 MED ORDER — OXYCODONE-ACETAMINOPHEN 5-325 MG PO TABS: 1.0000 | ORAL_TABLET | Freq: Four times a day (QID) | ORAL | Status: AC | PRN

## 2012-04-22 NOTE — Discharge Instructions (Signed)
Toe Fracture  Your caregiver has diagnosed you as having a fractured toe. A toe fracture is a break in the bone of a toe. "Buddy taping" is a way of splinting your broken toe, by taping the broken toe to the toe next to it. This "buddy taping" will keep the injured toe from moving beyond normal range of motion. Buddy taping also helps the toe heal in a more normal alignment. It may take 6 to 8 weeks for the toe injury to heal.  HOME CARE INSTRUCTIONS   · Leave your toes taped together for as long as directed by your caregiver or until you see a doctor for a follow-up examination. You can change the tape after bathing. Always use a small piece of gauze or cotton between the toes when taping them together. This will help the skin stay dry and prevent infection.  · Apply ice to the injury for 15 to 20 minutes each hour while awake for the first 2 days. Put the ice in a plastic bag and place a towel between the bag of ice and your skin.  · After the first 2 days, apply heat to the injured area. Use heat for the next 2 to 3 days. Place a heating pad on the foot or soak the foot in warm water as directed by your caregiver.  · Keep your foot elevated as much as possible to lessen swelling.  · Wear sturdy, supportive shoes. The shoes should not pinch the toes or fit tightly against the toes.  · Your caregiver may prescribe a rigid shoe if your foot is very swollen.  · Your may be given crutches if the pain is too great and it hurts too much to walk.  · Only take over-the-counter or prescription medicines for pain, discomfort, or fever as directed by your caregiver.  · If your caregiver has given you a follow-up appointment, it is very important to keep that appointment. Not keeping the appointment could result in a chronic or permanent injury, pain, and disability. If there is any problem keeping the appointment, you must call back to this facility for assistance.  SEEK MEDICAL CARE IF:   · You have increased pain or  swelling, not relieved with medications.  · The pain does not get better after 1 week.  · Your injured toe is cold when the others are warm.  SEEK IMMEDIATE MEDICAL CARE IF:   · The toe becomes cold, numb, or white.  · The toe becomes hot (inflamed) and red.  Document Released: 10/10/2000 Document Revised: 10/02/2011 Document Reviewed: 05/29/2008  [employer]® Patient Information ©2012 [employer].

## 2012-04-22 NOTE — ED Provider Notes (Signed)
History     CSN: [id number]  Arrival date & time 04/21/12  2311   First MD Initiated Contact with Patient 04/21/12 2327      Chief Complaint  Patient presents with  . Toe Pain  . Hand Pain    (Consider location/radiation/quality/duration/timing/severity/associated sxs/prior treatment) Patient is a 31 y.o. male presenting with toe pain. The history is provided by the patient.  Toe Pain This is a new problem. The current episode started today (pt stubbed his toe). The problem occurs constantly. The problem has been gradually worsening. The symptoms are aggravated by walking and standing (palpation). He has tried nothing for the symptoms.    History reviewed. No pertinent past medical history.  History reviewed. No pertinent past surgical history.  History reviewed. No pertinent family history.  History  Substance Use Topics  . Smoking status: Current Everyday Smoker  . Smokeless tobacco: Not on file  . Alcohol Use: Yes      Review of Systems  All other systems reviewed and are negative.    Allergies  Review of patient's allergies indicates no known allergies.  Home Medications   Current Outpatient Rx  Name Route Sig Dispense Refill  . ALBUTEROL SULFATE HFA 108 (90 BASE) MCG/ACT IN AERS Inhalation Inhale 2 puffs into the lungs every 6 (six) hours as needed. For shortness of breath    . IBUPROFEN 200 MG PO TABS Oral Take 800 mg by mouth every 6 (six) hours as needed. Pain    . NAPROXEN 500 MG PO TABS Oral Take 1 tablet (500 mg total) by mouth 2 (two) times daily. Take with food 30 tablet 0    BP 141/74  Pulse 74  Temp 98.9 F (37.2 C) (Oral)  Resp 16  SpO2 100%  Physical Exam  Musculoskeletal:       Right foot: He exhibits decreased range of motion, tenderness and bony tenderness. He exhibits no deformity.       Feet:    ED Course  Procedures (including critical care time)  Labs Reviewed - No data to display Dg Toe Great Right  04/22/2012   *RADIOLOGY REPORT*  Clinical Data: Hip pain and deformity after injury.  RIGHT GREAT TOE  Comparison: Right foot 01/06/2012  Findings: Transverse linear lucency along the base of the distal phalanx of the right first toe consistent with nondisplaced acute fracture.  Fracture lines appear to extend to the interphalangeal articular surface.  No radiopaque foreign bodies.  No focal bone lesions.  IMPRESSION: Comminuted nondisplaced fracture of the proximal aspect of the distal phalanx of the right first toe with extension to the articular surface.  Original Report Authenticated By: Marlon Pel, M.D.     No diagnosis found.    MDM  Distal comminuted fracture   Pt pain managed in ed. Home therapies and return precautions discussed. Advised f-u w ortho. buddy taped crutches and post op boot given in ED.         Jaci Carrel, [place] 04/22/12 0140

## 2012-04-24 NOTE — ED Provider Notes (Signed)
Medical screening examination/treatment/procedure(s) were performed by non-physician practitioner and as supervising physician I was immediately available for consultation/collaboration.   Gavin Pound. Keigo Whalley, MD 04/24/12 1104

## 2012-04-27 ENCOUNTER — Emergency Department (HOSPITAL_COMMUNITY)
Admission: EM | Admit: 2012-04-27 | Discharge: 2012-04-28 | Disposition: A | Discharge status: Home / Self Care | Payer: Self-pay | Attending: Emergency Medicine | Admitting: Emergency Medicine

## 2012-04-27 ENCOUNTER — Encounter (HOSPITAL_COMMUNITY): Payer: Self-pay | Admitting: *Deleted

## 2012-04-27 DIAGNOSIS — X58XXXA Exposure to other specified factors, initial encounter: Secondary | ICD-10-CM | POA: Insufficient documentation

## 2012-04-27 DIAGNOSIS — S92919A Unspecified fracture of unspecified toe(s), initial encounter for closed fracture: Secondary | ICD-10-CM | POA: Insufficient documentation

## 2012-04-27 DIAGNOSIS — F17200 Nicotine dependence, unspecified, uncomplicated: Secondary | ICD-10-CM | POA: Insufficient documentation

## 2012-04-27 DIAGNOSIS — S92401A Displaced unspecified fracture of right great toe, initial encounter for closed fracture: Secondary | ICD-10-CM

## 2012-04-27 NOTE — ED Notes (Signed)
Pt states he came in Friday and he had a fx of right great toe. Pt states he pain has not subside and it is still swollen. Pt he was supposed to have prednisone and did not the prescription. Pt states the pain medication is not working

## 2012-04-28 ENCOUNTER — Emergency Department (HOSPITAL_COMMUNITY): Payer: Self-pay

## 2012-04-28 ENCOUNTER — Emergency Department (HOSPITAL_COMMUNITY)
Admission: EM | Admit: 2012-04-28 | Discharge: 2012-04-28 | Disposition: A | Discharge status: Home / Self Care | Payer: Self-pay | Attending: Emergency Medicine | Admitting: Emergency Medicine

## 2012-04-28 ENCOUNTER — Encounter (HOSPITAL_COMMUNITY): Payer: Self-pay | Admitting: *Deleted

## 2012-04-28 DIAGNOSIS — F17200 Nicotine dependence, unspecified, uncomplicated: Secondary | ICD-10-CM | POA: Insufficient documentation

## 2012-04-28 DIAGNOSIS — J45901 Unspecified asthma with (acute) exacerbation: Secondary | ICD-10-CM | POA: Insufficient documentation

## 2012-04-28 DIAGNOSIS — J40 Bronchitis, not specified as acute or chronic: Secondary | ICD-10-CM | POA: Insufficient documentation

## 2012-04-28 DIAGNOSIS — Z9089 Acquired absence of other organs: Secondary | ICD-10-CM | POA: Insufficient documentation

## 2012-04-28 HISTORY — DX: Unspecified asthma, uncomplicated: J45.909

## 2012-04-28 MED ORDER — KETOROLAC TROMETHAMINE 60 MG/2ML IM SOLN
INTRAMUSCULAR | Status: AC
  Filled 2012-04-28: qty 2

## 2012-04-28 MED ORDER — IBUPROFEN 800 MG PO TABS: 800.0000 mg | ORAL_TABLET | Freq: Three times a day (TID) | ORAL | Status: AC

## 2012-04-28 MED ORDER — ALBUTEROL SULFATE (5 MG/ML) 0.5% IN NEBU
5.0000 mg | INHALATION_SOLUTION | Freq: Once | RESPIRATORY_TRACT | Status: AC
  Administered 2012-04-28: 5 mg via RESPIRATORY_TRACT

## 2012-04-28 MED ORDER — AMOXICILLIN 500 MG PO CAPS: 500.0000 mg | ORAL_CAPSULE | Freq: Three times a day (TID) | ORAL | Status: AC

## 2012-04-28 MED ORDER — ALBUTEROL SULFATE HFA 108 (90 BASE) MCG/ACT IN AERS
2.0000 | INHALATION_SPRAY | Freq: Once | RESPIRATORY_TRACT | Status: AC
  Administered 2012-04-28: 2 via RESPIRATORY_TRACT
  Filled 2012-04-28: qty 6.7

## 2012-04-28 MED ORDER — PREDNISONE 10 MG PO TABS: 20.0000 mg | ORAL_TABLET | Freq: Two times a day (BID) | ORAL | Status: DC

## 2012-04-28 MED ORDER — OXYCODONE-ACETAMINOPHEN 5-325 MG PO TABS: 1.0000 | ORAL_TABLET | ORAL | Status: AC | PRN

## 2012-04-28 MED ORDER — PREDNISONE 20 MG PO TABS
60.0000 mg | ORAL_TABLET | Freq: Once | ORAL | Status: AC
  Administered 2012-04-28: 60 mg via ORAL

## 2012-04-28 MED ORDER — KETOROLAC TROMETHAMINE 60 MG/2ML IM SOLN
60.0000 mg | Freq: Once | INTRAMUSCULAR | Status: AC
  Administered 2012-04-28: 60 mg via INTRAMUSCULAR
  Filled 2012-04-28: qty 2

## 2012-04-28 MED ORDER — PREDNISONE 20 MG PO TABS
ORAL_TABLET | ORAL | Status: AC
  Filled 2012-04-28: qty 3

## 2012-04-28 NOTE — ED Notes (Signed)
Patient asking for pain medications and when the MD is going to see him.

## 2012-04-28 NOTE — ED Notes (Signed)
Went in to see the patient and d/c the patient. The patient would like further care - awaiting another provider to consult on the patient

## 2012-04-28 NOTE — ED Notes (Addendum)
To xray via w/c. LS improved, wheezing remains, coughing persists, breathing easier, calmer, "feels better", increased air movement.

## 2012-04-28 NOTE — ED Notes (Signed)
Note  - medication was given in the right deltoid not the left deltoid per the patients request.

## 2012-04-28 NOTE — ED Notes (Signed)
Patient out of room to the restroom. Asking for pain medications.

## 2012-04-28 NOTE — ED Provider Notes (Signed)
Medical screening examination/treatment/procedure(s) were performed by non-physician practitioner and as supervising physician I was immediately available for consultation/collaboration.  Jojo Pehl T Dian Minahan, MD 04/28/12 2039 

## 2012-04-28 NOTE — ED Notes (Addendum)
Here for asthma sx, h/o same, arrives walking with cane and orthopedic shoe on R foot (broken toe), increased wob with audible wheezing. Denies fever. Admits to productive cough. Onset Sunday. Denies allergies to food or meds. Neb started per protocol on arrival. Speaking in short phrases. Last flare up ~ 1-2 yrs ago, last prednisone ~ 1-2 yrs ago, does not remember any cxr being done. Hospitalized for asthma only as a child. Never intubated. No maintenance inhaler. Was seen at [facility] yesterday for foot and breathing, but states, "they just tx'd foot".

## 2012-04-28 NOTE — ED Provider Notes (Signed)
History     CSN: [id number]  Arrival date & time 04/27/12  2224   None     Chief Complaint  Patient presents with  . Foot Pain    (Consider location/radiation/quality/duration/timing/severity/associated sxs/prior treatment) Patient is a 31 y.o. male presenting with lower extremity pain. The history is provided by the patient and a friend.  Foot Pain Associated symptoms include arthralgias and joint swelling.  31 y/o male INAD c/o pain to right great toe. Pt was seen several days ago and found to have comminuted fracture of great toe. Pt has not followed with ortho. States that pain meds are not helping and the toe is still swollen. Denies numbness or parasthesia  History reviewed. No pertinent past medical history.  History reviewed. No pertinent past surgical history.  No family history on file.  History  Substance Use Topics  . Smoking status: Current Everyday Smoker  . Smokeless tobacco: Not on file  . Alcohol Use: Yes      Review of Systems  Musculoskeletal: Positive for joint swelling and arthralgias.  All other systems reviewed and are negative.    Allergies  Review of patient's allergies indicates no known allergies.  Home Medications   Current Outpatient Rx  Name Route Sig Dispense Refill  . OXYCODONE-ACETAMINOPHEN 5-325 MG PO TABS Oral Take 1 tablet by mouth every 6 (six) hours as needed for pain. 30 tablet 0  . NYQUIL PO Oral Take 15 mLs by mouth every evening.     Marland Kitchen DAYQUIL PO Oral Take 15 mLs by mouth 2 (two) times daily as needed. For cold relief      BP 146/71  Pulse 73  Temp 99.5 F (37.5 C) (Oral)  Resp 18  Ht 5\' 11"  (1.803 m)  Wt 210 lb (95.255 kg)  BMI 29.29 kg/m2  SpO2 100%  Physical Exam  Vitals reviewed. Constitutional: He is oriented to person, place, and time. He appears well-developed and well-nourished. No distress.       Pt ambulates with crutches  HENT:  Head: Normocephalic.  Eyes: Conjunctivae and EOM are normal.    Cardiovascular: Normal rate and regular rhythm.   Pulmonary/Chest: Effort normal and breath sounds normal. No respiratory distress. He has no wheezes. He has no rales.  Abdominal: Soft. Bowel sounds are normal.  Musculoskeletal:       Buddy tape in place to right great toe and second toe, mild swelling.   Neurological: He is alert and oriented to person, place, and time.  Psychiatric: He has a normal mood and affect.    ED Course  Procedures (including critical care time)  Labs Reviewed - No data to display No results found.   1. Fracture of great toe, right, closed       MDM  31 y/o male with right great toe fracture on 6/26 with persistent pain and swelling. Reassured Pt that this was normal and advised him to make an appointment with the orthopedist.         Wynetta Emery, PA-C 04/28/12 [phone]

## 2012-04-28 NOTE — ED Notes (Signed)
Updated the MD about the patients concerns, Patient made aware that the MD will see the patient. The patient chose not wait to see the doctor.  Multiple conversations with the patient about follow up for his foot and his breathing.

## 2012-04-28 NOTE — ED Notes (Signed)
Patient reports that he has a history of Bronchitis and a history of surgeries in the past.

## 2012-04-28 NOTE — ED Provider Notes (Signed)
History     CSN: [id number]  Arrival date & time 04/28/12  1951   First MD Initiated Contact with Patient 04/28/12 2126      Chief Complaint  Patient presents with  . Asthma  . Wheezing    (Consider location/radiation/quality/duration/timing/severity/associated sxs/prior treatment) HPI Comments: Has history of rad, has been coughing   Patient is a 31 y.o. male presenting with asthma. The history is provided by the patient.  Asthma This is a recurrent problem. Episode onset: 5 days ago. The problem has been gradually worsening. Nothing aggravates the symptoms.    Past Medical History  Diagnosis Date  . Asthma     Past Surgical History  Procedure Date  . Facial cosmetic surgery     reconstructive  . Appendectomy     Family History  Problem Relation Age of Onset  . Diabetes Other     History  Substance Use Topics  . Smoking status: Current Everyday Smoker  . Smokeless tobacco: Not on file  . Alcohol Use: Yes      Review of Systems  All other systems reviewed and are negative.    Allergies  Review of patient's allergies indicates no known allergies.  Home Medications   Current Outpatient Rx  Name Route Sig Dispense Refill  . IBUPROFEN 800 MG PO TABS Oral Take 1 tablet (800 mg total) by mouth 3 (three) times daily. 21 tablet 0  . OXYCODONE-ACETAMINOPHEN 5-325 MG PO TABS Oral Take 1 tablet by mouth every 6 (six) hours as needed for pain. 30 tablet 0  . OXYCODONE-ACETAMINOPHEN 5-325 MG PO TABS Oral Take 1 tablet by mouth every 4 (four) hours as needed for pain. 8 tablet 0  . AMOXICILLIN 500 MG PO CAPS Oral Take 1 capsule (500 mg total) by mouth 3 (three) times daily. 21 capsule 0  . PREDNISONE 10 MG PO TABS Oral Take 2 tablets (20 mg total) by mouth 2 (two) times daily. 12 tablet 0    BP 131/72  Pulse 111  Temp 99.9 F (37.7 C) (Oral)  Resp 22  SpO2 100%  Physical Exam  Nursing note and vitals reviewed. Constitutional: He is oriented to person,  place, and time. He appears well-developed and well-nourished.  HENT:  Head: Normocephalic and atraumatic.  Mouth/Throat: Oropharynx is clear and moist.  Neck: Normal range of motion. Neck supple.  Cardiovascular: Normal rate.  Exam reveals no friction rub.   No murmur heard. Pulmonary/Chest: Effort normal.       There are bilateral expiratory wheezes present.  Abdominal: Soft. Bowel sounds are normal.  Musculoskeletal: Normal range of motion. He exhibits no edema.  Lymphadenopathy:    He has no cervical adenopathy.  Neurological: He is alert and oriented to person, place, and time.  Skin: Skin is warm and dry. He is not diaphoretic.    ED Course  Procedures (including critical care time)  Labs Reviewed - No data to display Dg Chest 2 View  04/28/2012  *RADIOLOGY REPORT*  Clinical Data: Wheezing, asthma  CHEST - 2 VIEW  Comparison: 10/26/2010  Findings: Cardiomediastinal silhouette is unremarkable.  No acute infiltrate or pleural effusion.  No pulmonary edema.  Bony thorax is stable.  IMPRESSION: No active disease.  No significant change.  Original Report Authenticated By: Natasha Mead, M.D.     1. Bronchitis   2. Exacerbation of RAD (reactive airway disease)       MDM  Feels better with nebs, steroids.  Will send home with same.  Geoffery Lyons, MD 05/03/12 [phone]

## 2012-07-21 ENCOUNTER — Encounter (HOSPITAL_COMMUNITY): Payer: Self-pay

## 2012-07-21 ENCOUNTER — Emergency Department (HOSPITAL_COMMUNITY)
Admission: EM | Admit: 2012-07-21 | Discharge: 2012-07-21 | Disposition: A | Discharge status: Home / Self Care | Payer: Self-pay | Attending: Emergency Medicine | Admitting: Emergency Medicine

## 2012-07-21 DIAGNOSIS — T50901A Poisoning by unspecified drugs, medicaments and biological substances, accidental (unintentional), initial encounter: Secondary | ICD-10-CM | POA: Insufficient documentation

## 2012-07-21 DIAGNOSIS — I498 Other specified cardiac arrhythmias: Secondary | ICD-10-CM | POA: Insufficient documentation

## 2012-07-21 DIAGNOSIS — T50904A Poisoning by unspecified drugs, medicaments and biological substances, undetermined, initial encounter: Secondary | ICD-10-CM | POA: Insufficient documentation

## 2012-07-21 LAB — COMPREHENSIVE METABOLIC PANEL
ALT: 58 U/L — ABNORMAL HIGH (ref 0–53)
AST: 40 U/L — ABNORMAL HIGH (ref 0–37)
Albumin: 3.9 g/dL (ref 3.5–5.2)
Alkaline Phosphatase: 79 U/L (ref 39–117)
CO2: 24 mEq/L (ref 19–32)
Chloride: 102 mEq/L (ref 96–112)
Creatinine, Ser: 0.95 mg/dL (ref 0.50–1.35)
GFR calc non Af Amer: >90 mL/min (ref >90)
Potassium: 3.8 mEq/L (ref 3.5–5.1)
Sodium: 139 mEq/L (ref 135–145)
Total Bilirubin: 0.2 mg/dL — ABNORMAL LOW (ref 0.3–1.2)

## 2012-07-21 LAB — CBC
MCV: 88 fL (ref 78.0–100.0)
Platelets: 293 10*3/uL (ref 150–400)
RBC: 5.09 MIL/uL (ref 4.22–5.81)
RDW: 13.1 % (ref 11.5–15.5)
WBC: 12.3 10*3/uL — ABNORMAL HIGH (ref 4.0–10.5)

## 2012-07-21 LAB — RAPID URINE DRUG SCREEN, HOSP PERFORMED
Amphetamines: NOT DETECTED
Barbiturates: NOT DETECTED
Benzodiazepines: NOT DETECTED
Cocaine: POSITIVE — AB
Tetrahydrocannabinol: POSITIVE — AB

## 2012-07-21 LAB — GLUCOSE, CAPILLARY: Glucose-Capillary: 119 mg/dL — ABNORMAL HIGH (ref 70–99)

## 2012-07-21 NOTE — ED Provider Notes (Signed)
Pt has sobered up.  He does not want any treatment.  Stable for discharge  Celene Kras, MD 07/21/12 1251
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
## 2012-07-21 NOTE — ED Notes (Signed)
Poison control called to check on pt, given updated vital signs, lab results and status of pt at this time.
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
## 2012-07-21 NOTE — ED Notes (Signed)
Pt presented to the ED and collapsed in a chair. Pt is unable to answer any questions. pt sts "drank a lot".

## 2012-07-21 NOTE — ED Provider Notes (Signed)
History     CSN: [id number]  Arrival date & time 07/21/12  0416   First MD Initiated Contact with Patient 07/21/12 [phone]      Chief Complaint  Patient presents with  . Drug Overdose    (Consider location/radiation/quality/duration/timing/severity/associated sxs/prior treatment) HPI Limited history provided by PT, admits to taking a lot of drugs tonight and now presents here.  He admits to alcohol, cocaine, marijuana, molly and hydrocodone.  He took all of this throughout the day, state she is stressed out and wants to speak to a counsler. He denies SI/ HI, no self injury otherwise. He denies any PSY history or similar presentation in the past. Stress is mod in severity.  Past Medical History  Diagnosis Date  . Asthma     Past Surgical History  Procedure Date  . Facial cosmetic surgery     reconstructive  . Appendectomy     Family History  Problem Relation Age of Onset  . Diabetes Other     History  Substance Use Topics  . Smoking status: Current Every Day Smoker  . Smokeless tobacco: Not on file  . Alcohol Use: Yes      Review of Systems  Constitutional: Negative for fever and chills.  HENT: Negative for neck pain and neck stiffness.   Eyes: Negative for pain.  Respiratory: Negative for shortness of breath.   Cardiovascular: Negative for chest pain.  Gastrointestinal: Negative for abdominal pain.  Genitourinary: Negative for dysuria.  Musculoskeletal: Negative for back pain.  Skin: Negative for rash.  Neurological: Negative for headaches.  Psychiatric/Behavioral: Positive for dysphoric mood.  All other systems reviewed and are negative.    Allergies  Review of patient's allergies indicates no known allergies.  Home Medications   Current Outpatient Rx  Name Route Sig Dispense Refill  . PREDNISONE 10 MG PO TABS Oral Take 2 tablets (20 mg total) by mouth 2 (two) times daily. 12 tablet 0    BP 148/86  Pulse 108  Resp 14  SpO2 98%  Physical Exam    Constitutional: He is oriented to person, place, and time. He appears well-developed and well-nourished.  HENT:  Head: Normocephalic and atraumatic.  Eyes: Conjunctivae normal and EOM are normal. Pupils are equal, round, and reactive to light.  Neck: Trachea normal. Neck supple. No thyromegaly present.  Cardiovascular: Normal rate, regular rhythm, S1 normal, S2 normal and normal pulses.     No systolic murmur is present   No diastolic murmur is present  Pulses:      Radial pulses are 2+ on the right side, and 2+ on the left side.  Pulmonary/Chest: Effort normal and breath sounds normal. He has no wheezes. He has no rhonchi. He has no rales. He exhibits no tenderness.  Abdominal: Soft. Normal appearance and bowel sounds are normal. There is no tenderness. There is no CVA tenderness and negative Murphy's sign.  Musculoskeletal:       BLE:s Calves nontender, no cords or erythema, negative Homans sign  Neurological: He is alert and oriented to person, place, and time. He has normal strength. No cranial nerve deficit or sensory deficit. GCS eye subscore is 4. GCS verbal subscore is 5. GCS motor subscore is 6.       Speech is slow   Skin: Skin is warm and dry. No rash noted. He is not diaphoretic.  Psychiatric: His speech is normal.       Cooperative and appropriate    ED Course  Procedures (including critical  care time)  Results for orders placed during the hospital encounter of 07/21/12  CBC      Component Value Range   WBC 12.3 (*) 4.0 - 10.5 K/uL   RBC 5.09  4.22 - 5.81 MIL/uL   Hemoglobin 15.5  13.0 - 17.0 g/dL   HCT 96.0  45.4 - 09.8 %   MCV 88.0  78.0 - 100.0 fL   MCH 30.5  26.0 - 34.0 pg   MCHC 34.6  30.0 - 36.0 g/dL   RDW 11.9  14.7 - 82.9 %   Platelets 293  150 - 400 K/uL  COMPREHENSIVE METABOLIC PANEL      Component Value Range   Sodium 139  135 - 145 mEq/L   Potassium 3.8  3.5 - 5.1 mEq/L   Chloride 102  96 - 112 mEq/L   CO2 24  19 - 32 mEq/L   Glucose, Bld 107 (*)  70 - 99 mg/dL   BUN 9  6 - 23 mg/dL   Creatinine, Ser 5.62  0.50 - 1.35 mg/dL   Calcium 9.3  8.4 - 13.0 mg/dL   Total Protein 7.4  6.0 - 8.3 g/dL   Albumin 3.9  3.5 - 5.2 g/dL   AST 40 (*) 0 - 37 U/L   ALT 58 (*) 0 - 53 U/L   Alkaline Phosphatase 79  39 - 117 U/L   Total Bilirubin 0.2 (*) 0.3 - 1.2 mg/dL   GFR calc non Af Amer >90  >90 mL/min   GFR calc Af Amer >90  >90 mL/min  ETHANOL      Component Value Range   Alcohol, Ethyl (B) 127 (*) 0 - 11 mg/dL  ACETAMINOPHEN LEVEL      Component Value Range   Acetaminophen (Tylenol), Serum <15.0  10 - 30 ug/mL  SALICYLATE LEVEL      Component Value Range   Salicylate Lvl <2.0 (*) 2.8 - 20.0 mg/dL  GLUCOSE, CAPILLARY      Component Value Range   Glucose-Capillary 119 (*) 70 - 99 mg/dL     Date: 86/57/8469  Rate: 104  Rhythm: sinus tachycardia  QRS Axis: normal  Intervals: normal  ST/T Wave abnormalities: nonspecific ST changes  Conduction Disutrbances:first-degree A-V block   Narrative Interpretation: ST with QRS 82, QTc 421  Old EKG Reviewed: none available  IVFs. Labs. Cardiac monitoring. UA/ UDS.   ACT consult for assessment. D/w ACT at 6am.   D/w poison control. Plan Observation in the ED until 11am.    ACT will evaluate bedside.   Plan sober the ED and serial evaluations.   MDM   Polysubstance Abuse- 31 yo male no PSYCH history.   Work up as above: labs and ECG, cardiac monitoring.  ACT assessment.         Sunnie Nielsen, MD 07/21/12 [phone]

## 2012-07-21 NOTE — ED Notes (Signed)
Pt wanded by security, 1 bag of pt belongings, in pts boot in bag there is keys, wallet, knife, tape measure.

## 2013-03-14 ENCOUNTER — Emergency Department (INDEPENDENT_AMBULATORY_CARE_PROVIDER_SITE_OTHER): Payer: Self-pay

## 2013-03-14 ENCOUNTER — Emergency Department (INDEPENDENT_AMBULATORY_CARE_PROVIDER_SITE_OTHER)
Admission: EM | Admit: 2013-03-14 | Discharge: 2013-03-14 | Disposition: A | Discharge status: Home / Self Care | Payer: Self-pay | Source: Home / Self Care | Attending: Emergency Medicine | Admitting: Emergency Medicine

## 2013-03-14 ENCOUNTER — Encounter (HOSPITAL_COMMUNITY): Payer: Self-pay | Admitting: Emergency Medicine

## 2013-03-14 DIAGNOSIS — S8000XA Contusion of unspecified knee, initial encounter: Secondary | ICD-10-CM

## 2013-03-14 DIAGNOSIS — S8002XA Contusion of left knee, initial encounter: Secondary | ICD-10-CM

## 2013-03-14 MED ORDER — MELOXICAM 7.5 MG PO TABS: 7.5000 mg | ORAL_TABLET | Freq: Every day | ORAL | Status: DC
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
## 2013-08-13 DIAGNOSIS — R880 Cloudy (hemodialysis) (peritoneal) dialysis effluent: Secondary | ICD-10-CM | POA: Insufficient documentation

## 2013-08-13 DIAGNOSIS — IMO0002 Reserved for concepts with insufficient information to code with codable children: Secondary | ICD-10-CM | POA: Insufficient documentation

## 2013-08-13 DIAGNOSIS — F17200 Nicotine dependence, unspecified, uncomplicated: Secondary | ICD-10-CM | POA: Insufficient documentation

## 2013-08-13 DIAGNOSIS — Z79899 Other long term (current) drug therapy: Secondary | ICD-10-CM | POA: Insufficient documentation

## 2013-08-13 DIAGNOSIS — J45901 Unspecified asthma with (acute) exacerbation: Secondary | ICD-10-CM | POA: Insufficient documentation

## 2013-08-14 ENCOUNTER — Emergency Department (HOSPITAL_COMMUNITY): Payer: Self-pay

## 2013-08-14 ENCOUNTER — Encounter (HOSPITAL_COMMUNITY): Payer: Self-pay | Admitting: Emergency Medicine

## 2013-08-14 ENCOUNTER — Emergency Department (HOSPITAL_COMMUNITY)
Admission: EM | Admit: 2013-08-14 | Discharge: 2013-08-14 | Disposition: A | Discharge status: Home / Self Care | Payer: Self-pay | Attending: Emergency Medicine | Admitting: Emergency Medicine

## 2013-08-14 DIAGNOSIS — M79644 Pain in right finger(s): Secondary | ICD-10-CM

## 2013-08-14 DIAGNOSIS — R0602 Shortness of breath: Secondary | ICD-10-CM

## 2013-08-14 MED ORDER — ALBUTEROL SULFATE HFA 108 (90 BASE) MCG/ACT IN AERS
2.0000 | INHALATION_SPRAY | Freq: Once | RESPIRATORY_TRACT | Status: AC
  Administered 2013-08-14: 2 via RESPIRATORY_TRACT
  Filled 2013-08-14: qty 6.7

## 2013-08-14 MED ORDER — ALBUTEROL SULFATE (5 MG/ML) 0.5% IN NEBU
2.5000 mg | INHALATION_SOLUTION | Freq: Once | RESPIRATORY_TRACT | Status: AC
  Administered 2013-08-14: 2.5 mg via RESPIRATORY_TRACT
  Filled 2013-08-14: qty 0.5

## 2013-08-14 MED ORDER — PREDNISONE 20 MG PO TABS
60.0000 mg | ORAL_TABLET | Freq: Once | ORAL | Status: AC
  Administered 2013-08-14: 60 mg via ORAL
  Filled 2013-08-14: qty 3

## 2013-08-14 MED ORDER — IPRATROPIUM BROMIDE 0.02 % IN SOLN
0.5000 mg | Freq: Once | RESPIRATORY_TRACT | Status: AC
  Administered 2013-08-14: 0.5 mg via RESPIRATORY_TRACT
  Filled 2013-08-14: qty 2.5

## 2013-08-14 MED ORDER — PREDNISONE 20 MG PO TABS: 40.0000 mg | ORAL_TABLET | Freq: Every day | ORAL | Status: DC

## 2013-08-14 MED ORDER — IBUPROFEN 600 MG PO TABS: 600.0000 mg | ORAL_TABLET | Freq: Four times a day (QID) | ORAL | Status: DC | PRN

## 2013-08-14 NOTE — ED Provider Notes (Signed)
CSN: [id number]     Arrival date & time 08/13/13  2356 History   First MD Initiated Contact with Patient 08/14/13 0033     Chief Complaint  Patient presents with  . Shortness of Breath   (Consider location/radiation/quality/duration/timing/severity/associated sxs/prior Treatment) HPI Comments: Patient is a 32 year old male with a history of asthma who presents for shortness of breath x1 week. Patient states that symptoms are worse with exertion and without alleviating factors. Patient has not taken anything for his symptoms and the nurses and associated dry nonproductive cough as well as pain in his thoracic back. He denies associated fever, syncope, chest pain, numbness or tingling, nausea or vomiting, and weakness. He denies a history of hospitalizations or intubations secondary to asthma exacerbations. Patient is without recent surgeries or hospitalizations, prolonged travel, and he is currently not on any hormone replacement therapy. He is a 1ppd smoker.  Patient is a 32 y.o. male presenting with shortness of breath. The history is provided by the patient. No language interpreter was used.  Shortness of Breath Associated symptoms: no chest pain, no fever, no neck pain and no vomiting     Past Medical History  Diagnosis Date  . Asthma    Past Surgical History  Procedure Laterality Date  . Facial cosmetic surgery      reconstructive  . Appendectomy     Family History  Problem Relation Age of Onset  . Diabetes Other    History  Substance Use Topics  . Smoking status: Current Every Day Smoker -- 0.50 packs/day    Types: Cigarettes  . Smokeless tobacco: Not on file  . Alcohol Use: No    Review of Systems  Constitutional: Negative for fever.  HENT: Positive for congestion. Negative for rhinorrhea.   Eyes: Negative for visual disturbance.  Respiratory: Positive for shortness of breath.   Cardiovascular: Negative for chest pain.  Gastrointestinal: Negative for nausea and  vomiting.  Musculoskeletal: Negative for neck pain and neck stiffness.  Neurological: Negative for syncope, weakness and numbness.  All other systems reviewed and are negative.    Allergies  Review of patient's allergies indicates no known allergies.  Home Medications   Current Outpatient Rx  Name  Route  Sig  Dispense  Refill  . albuterol (PROVENTIL HFA;VENTOLIN HFA) 108 (90 BASE) MCG/ACT inhaler   Inhalation   Inhale 2 puffs into the lungs every 6 (six) hours as needed. For shortness of breath         . ibuprofen (ADVIL,MOTRIN) 800 MG tablet   Oral   Take 800 mg by mouth every 8 (eight) hours as needed for pain.         Marland Kitchen ibuprofen (ADVIL,MOTRIN) 600 MG tablet   Oral   Take 1 tablet (600 mg total) by mouth every 6 (six) hours as needed for pain.   30 tablet   0   . predniSONE (DELTASONE) 20 MG tablet   Oral   Take 2 tablets (40 mg total) by mouth daily.   10 tablet   0    BP 113/58  Pulse 61  Temp(Src) 99.3 F (37.4 C) (Oral)  Resp 18  SpO2 100%  Physical Exam  Nursing note and vitals reviewed. Constitutional: He is oriented to person, place, and time. He appears well-developed and well-nourished. No distress.  HENT:  Head: Normocephalic and atraumatic.  Mouth/Throat: Oropharynx is clear and moist. No oropharyngeal exudate.  Eyes: Conjunctivae and EOM are normal. Pupils are equal, round, and reactive to light. No  scleral icterus.  Neck: Normal range of motion. Neck supple.  Cardiovascular: Normal rate, regular rhythm, normal heart sounds and intact distal pulses.   Pulmonary/Chest: Effort normal and breath sounds normal. No respiratory distress. He has no wheezes. He has no rales.  No accessory muscle use or retractions. Symmetric chest expansion. Patient in no acute respiratory distress.  Abdominal: Soft. He exhibits no distension. There is no tenderness.  Musculoskeletal: Normal range of motion.  Neurological: He is alert and oriented to person, place,  and time.  Skin: Skin is warm and dry. No rash noted. He is not diaphoretic. No erythema. No pallor.  Psychiatric: He has a normal mood and affect. His behavior is normal.    ED Course  Procedures (including critical care time) Labs Review Labs Reviewed - No data to display Imaging Review Dg Chest 2 View  08/14/2013   CLINICAL DATA:  Shortness of breath  EXAM: CHEST  2 VIEW  COMPARISON:  04/28/2012  FINDINGS: Normal heart size and mediastinal contours. Chronic cortical thickening and undulation of the lateral right 6th rib. Lungs are clear and well aerated. No effusion or pneumothorax.  IMPRESSION: No active cardiopulmonary disease.   Electronically Signed   By: Tiburcio Pea M.D.   On: 08/14/2013 01:49   Dg Finger Little Right  08/14/2013   CLINICAL DATA:  Shortness of breath. Little finger pain for weeks. No known injury  EXAM: RIGHT LITTLE FINGER 2+V  COMPARISON:  None.  FINDINGS: There is no evidence of fracture or dislocation. There is no evidence of arthropathy or other focal bone abnormality. Soft tissues are unremarkable.  IMPRESSION: Negative.   Electronically Signed   By: Tiburcio Pea M.D.   On: 08/14/2013 01:52    EKG Interpretation   None       MDM   1. Shortness of breath   2. Finger pain, right    Patient is a 32 year old male with a history of asthma and one pack per day smoking history who presents for shortness of breath x1 week. Patient well and nontoxic appearing, hemodynamically stable, and afebrile. Patient also without tachycardia, tachypnea, dyspnea, or hypoxia. Lungs clear to auscultation bilaterally on physical exam. No retractions or accessory muscle use; symmetric chest expansion. Chest x-ray today without evidence of pneumonia, pneumothorax, pleural effusion, or other acute cardiopulmonary abnormality. Patient complained to nurse about right fifth finger pain as well; x-ray without bony deformity or fracture.  Patient treated in the ED with prednisone  by mouth as well as duo neb. He is able to ambulate without hypoxia and has had stable oxygen saturations throughout duration of the ED stay. Believe patient to be stable and appropriate for discharge with primary care followup as needed returning symptoms. Patient given albuterol inhaler as well as prescription for prednisone burst. Ibuprofen advised for pain control of right fifth finger. Return precautions provided and patient agreeable to plan with no unaddressed concerns.   Filed Vitals:   08/14/13 0013 08/14/13 0023 08/14/13 0221  BP: 124/68  113/58  Pulse:  71 61  Temp: 99.3 F (37.4 C)    TempSrc: Oral    Resp:   18  SpO2:  100% 100%       Antony Madura, PA-C 08/14/13 2248

## 2013-08-14 NOTE — ED Notes (Signed)
Pt arrived to the ED with a complaint of shortness of breath.  Pt has a history of asthma but doesn't have an inhaler since he hasn't used it in years.  Pt states that when he breaths deeply he has a pain in the medial back area.

## 2013-08-14 NOTE — ED Notes (Signed)
Pt reports feeling less congested.  Pt able to ambulate and keep O2 sats above 92%.

## 2013-08-14 NOTE — ED Notes (Addendum)
Pt reports that he punched the wall about 3 weeks ago and is complaining of pain in his right pinky finger.  Pt is able to bend finger but reports that it feels swollen.

## 2013-08-15 NOTE — ED Provider Notes (Signed)
Medical screening examination/treatment/procedure(s) were performed by non-physician practitioner and as supervising physician I was immediately available for consultation/collaboration.   Loren Racer, MD 08/15/13 [phone]

## 2013-12-29 ENCOUNTER — Encounter (HOSPITAL_COMMUNITY): Payer: Self-pay | Admitting: Emergency Medicine

## 2013-12-29 ENCOUNTER — Emergency Department (HOSPITAL_COMMUNITY)
Admission: EM | Admit: 2013-12-29 | Discharge: 2013-12-29 | Disposition: A | Discharge status: Home / Self Care | Payer: Self-pay | Attending: Emergency Medicine | Admitting: Emergency Medicine

## 2013-12-29 ENCOUNTER — Emergency Department (HOSPITAL_COMMUNITY): Payer: Self-pay

## 2013-12-29 DIAGNOSIS — IMO0002 Reserved for concepts with insufficient information to code with codable children: Secondary | ICD-10-CM | POA: Insufficient documentation

## 2013-12-29 DIAGNOSIS — J45901 Unspecified asthma with (acute) exacerbation: Secondary | ICD-10-CM | POA: Insufficient documentation

## 2013-12-29 DIAGNOSIS — M5432 Sciatica, left side: Secondary | ICD-10-CM

## 2013-12-29 DIAGNOSIS — Z79899 Other long term (current) drug therapy: Secondary | ICD-10-CM | POA: Insufficient documentation

## 2013-12-29 DIAGNOSIS — M545 Low back pain, unspecified: Secondary | ICD-10-CM

## 2013-12-29 DIAGNOSIS — J069 Acute upper respiratory infection, unspecified: Secondary | ICD-10-CM | POA: Insufficient documentation

## 2013-12-29 DIAGNOSIS — F17200 Nicotine dependence, unspecified, uncomplicated: Secondary | ICD-10-CM | POA: Insufficient documentation

## 2013-12-29 DIAGNOSIS — M5430 Sciatica, unspecified side: Secondary | ICD-10-CM | POA: Insufficient documentation

## 2013-12-29 DIAGNOSIS — B9789 Other viral agents as the cause of diseases classified elsewhere: Secondary | ICD-10-CM

## 2013-12-29 DIAGNOSIS — Z791 Long term (current) use of non-steroidal anti-inflammatories (NSAID): Secondary | ICD-10-CM | POA: Insufficient documentation

## 2013-12-29 MED ORDER — ALBUTEROL SULFATE (2.5 MG/3ML) 0.083% IN NEBU
5.0000 mg | INHALATION_SOLUTION | Freq: Once | RESPIRATORY_TRACT | Status: AC
  Administered 2013-12-29: 5 mg via RESPIRATORY_TRACT
  Filled 2013-12-29: qty 6

## 2013-12-29 MED ORDER — IBUPROFEN 800 MG PO TABS: 800.0000 mg | ORAL_TABLET | Freq: Three times a day (TID) | ORAL | Status: DC

## 2013-12-29 MED ORDER — PREDNISONE 20 MG PO TABS: 40.0000 mg | ORAL_TABLET | Freq: Every day | ORAL | Status: DC

## 2013-12-29 MED ORDER — ALBUTEROL SULFATE HFA 108 (90 BASE) MCG/ACT IN AERS: 2.0000 | INHALATION_SPRAY | RESPIRATORY_TRACT | Status: DC | PRN

## 2013-12-29 MED ORDER — PREDNISONE 20 MG PO TABS
60.0000 mg | ORAL_TABLET | Freq: Once | ORAL | Status: AC
  Administered 2013-12-29: 60 mg via ORAL
  Filled 2013-12-29: qty 3

## 2013-12-29 MED ORDER — METHOCARBAMOL 500 MG PO TABS: 500.0000 mg | ORAL_TABLET | Freq: Two times a day (BID) | ORAL | Status: DC

## 2013-12-29 MED ORDER — ALBUTEROL SULFATE (2.5 MG/3ML) 0.083% IN NEBU: 2.5000 mg | INHALATION_SOLUTION | RESPIRATORY_TRACT | Status: DC | PRN

## 2013-12-29 NOTE — Discharge Instructions (Signed)
1. Medications: albuterol MDI and solution for wheezing, prednisone for asthma exacerbation, ibuprofen is your anti-inflammatory and robaxin is your muscle relaxer, usual home medications 2. Treatment: rest, drink plenty of fluids, gentle stretching 3. Follow Up: Please followup with your primary doctor for discussion of your diagnoses and further evaluation after today's visit; if you do not have a primary care doctor use the resource guide provided to find one;    Asthma, Adult Asthma is a recurring condition in which the airways tighten and narrow. Asthma can make it difficult to breathe. It can cause coughing, wheezing, and shortness of breath. Asthma episodes (also called asthma attacks) range from minor to life-threatening. Asthma cannot be cured, but medicines and lifestyle changes can help control it. CAUSES Asthma is believed to be caused by inherited (genetic) and environmental factors, but its exact cause is unknown. Asthma may be triggered by allergens, lung infections, or irritants in the air. Asthma triggers are different for each person. Common triggers include:   Animal dander.  Dust mites.  Cockroaches.  Pollen from trees or grass.  Mold.  Smoke.  Air pollutants such as dust, household cleaners, hair sprays, aerosol sprays, paint fumes, strong chemicals, or strong odors.  Cold air, weather changes, and winds (which increase molds and pollens in the air).  Strong emotional expressions such as crying or laughing hard.  Stress.  Certain medicines (such as aspirin) or types of drugs (such as beta-blockers).  Sulfites in foods and drinks. Foods and drinks that may contain sulfites include dried fruit, potato chips, and sparkling grape juice.  Infections or inflammatory conditions such as the flu, a cold, or an inflammation of the nasal membranes (rhinitis).  Gastroesophageal reflux disease (GERD).  Exercise or strenuous activity. SYMPTOMS Symptoms may occur  immediately after asthma is triggered or many hours later. Symptoms include:  Wheezing.  Excessive nighttime or early morning coughing.  Frequent or severe coughing with a common cold.  Chest tightness.  Shortness of breath. DIAGNOSIS  The diagnosis of asthma is made by a review of your medical history and a physical exam. Tests may also be performed. These may include:  Lung function studies. These tests show how much air you breath in and out.  Allergy tests.  Imaging tests such as X-rays. TREATMENT  Asthma cannot be cured, but it can usually be controlled. Treatment involves identifying and avoiding your asthma triggers. It also involves medicines. There are 2 classes of medicine used for asthma treatment:   Controller medicines. These prevent asthma symptoms from occurring. They are usually taken every day.  Reliever or rescue medicines. These quickly relieve asthma symptoms. They are used as needed and provide short-term relief. Your health care provider will help you create an asthma action plan. An asthma action plan is a written plan for managing and treating your asthma attacks. It includes a list of your asthma triggers and how they may be avoided. It also includes information on when medicines should be taken and when their dosage should be changed. An action plan may also involve the use of a device called a peak flow meter. A peak flow meter measures how well the lungs are working. It helps you monitor your condition. HOME CARE INSTRUCTIONS   Take medicine as directed by your health care provider. Speak with your health care provider if you have questions about how or when to take the medicines.  Use a peak flow meter as directed by your health care provider. Record and keep track  of readings.  Understand and use the action plan to help minimize or stop an asthma attack without needing to seek medical care.  Control your home environment in the following ways to help  prevent asthma attacks:  Do not smoke. Avoid being exposed to secondhand smoke.  Change your heating and air conditioning filter regularly.  Limit your use of fireplaces and wood stoves.  Get rid of pests (such as roaches and mice) and their droppings.  Throw away plants if you see mold on them.  Clean your floors and dust regularly. Use unscented cleaning products.  Try to have someone else vacuum for you regularly. Stay out of rooms while they are being vacuumed and for a short while afterward. If you vacuum, use a dust mask from a hardware store, a double-layered or microfilter vacuum cleaner bag, or a vacuum cleaner with a HEPA filter.  Replace carpet with wood, tile, or vinyl flooring. Carpet can trap dander and dust.  Use allergy-proof pillows, mattress covers, and box spring covers.  Wash bed sheets and blankets every week in hot water and dry them in a dryer.  Use blankets that are made of polyester or cotton.  Clean bathrooms and kitchens with bleach. If possible, have someone repaint the walls in these rooms with mold-resistant paint. Keep out of the rooms that are being cleaned and painted.  Wash hands frequently. SEEK MEDICAL CARE IF:   You have wheezing, shortness of breath, or a cough even if taking medicine to prevent attacks.  The colored mucus you cough up (sputum) is thicker than usual.  Your sputum changes from clear or white to yellow, green, gray, or bloody.  You have any problems that may be related to the medicines you are taking (such as a rash, itching, swelling, or trouble breathing).  You are using a reliever medicine more than 2 3 times per week.  Your peak flow is still at 50 79% of you personal best after following your action plan for 1 hour. SEEK IMMEDIATE MEDICAL CARE IF:   You seem to be getting worse and are unresponsive to treatment during an asthma attack.  You are short of breath even at rest.  You get short of breath when doing very  little physical activity.  You have difficulty eating, drinking, or talking due to asthma symptoms.  You develop chest pain.  You develop a fast heartbeat.  You have a bluish color to your lips or fingernails.  You are lightheaded, dizzy, or faint.  Your peak flow is less than 50% of your personal best.  You have a fever or persistent symptoms for more than 2 3 days.  You have a fever and symptoms suddenly get worse. MAKE SURE YOU:   Understand these instructions.  Will watch your condition.  Will get help right away if you are not doing well or get worse. Document Released: 10/13/2005 Document Revised: 06/15/2013 Document Reviewed: 05/12/2013 [facility] Patient Information 2014 [place], [place].  Back Exercises Back exercises help treat and prevent back injuries. The goal of back exercises is to increase the strength of your abdominal and back muscles and the flexibility of your back. These exercises should be started when you no longer have back pain. Back exercises include:  Pelvic Tilt. Lie on your back with your knees bent. Tilt your pelvis until the lower part of your back is against the floor. Hold this position 5 to 10 sec and repeat 5 to 10 times.  Knee to Chest. Pull  first 1 knee up against your chest and hold for 20 to 30 seconds, repeat this with the other knee, and then both knees. This may be done with the other leg straight or bent, whichever feels better.  Sit-Ups or Curl-Ups. Bend your knees 90 degrees. Start with tilting your pelvis, and do a partial, slow sit-up, lifting your trunk only 30 to 45 degrees off the floor. Take at least 2 to 3 seconds for each sit-up. Do not do sit-ups with your knees out straight. If partial sit-ups are difficult, simply do the above but with only tightening your abdominal muscles and holding it as directed.  Hip-Lift. Lie on your back with your knees flexed 90 degrees. Push down with your feet and shoulders as you raise your hips a  couple inches off the floor; hold for 10 seconds, repeat 5 to 10 times.  Back arches. Lie on your stomach, propping yourself up on bent elbows. Slowly press on your hands, causing an arch in your low back. Repeat 3 to 5 times. Any initial stiffness and discomfort should lessen with repetition over time.  Shoulder-Lifts. Lie face down with arms beside your body. Keep hips and torso pressed to floor as you slowly lift your head and shoulders off the floor. Do not overdo your exercises, especially in the beginning. Exercises may cause you some mild back discomfort which lasts for a few minutes; however, if the pain is more severe, or lasts for more than 15 minutes, do not continue exercises until you see your caregiver. Improvement with exercise therapy for back problems is slow.  See your caregivers for assistance with developing a proper back exercise program. Document Released: 11/20/2004 Document Revised: 01/05/2012 Document Reviewed: 08/14/2011 [facility] Patient Information 2014 [place].    Emergency Department Resource Guide 1) Find a Doctor and Pay Out of Pocket Although you won't have to find out who is covered by your insurance plan, it is a good idea to ask around and get recommendations. You will then need to call the office and see if the doctor you have chosen will accept you as a new patient and what types of options they offer for patients who are self-pay. Some doctors offer discounts or will set up payment plans for their patients who do not have insurance, but you will need to ask so you aren't surprised when you get to your appointment.  2) Contact Your Local Health Department Not all health departments have doctors that can see patients for sick visits, but many do, so it is worth a call to see if yours does. If you don't know where your local health department is, you can check in your phone book. The CDC also has a tool to help you locate your state's health department, and  many state websites also have listings of all of their local health departments.  3) Find a Walk-in Clinic If your illness is not likely to be very severe or complicated, you may want to try a walk in clinic. These are popping up all over the country in pharmacies, drugstores, and shopping centers. They're usually staffed by nurse practitioners or physician assistants that have been trained to treat common illnesses and complaints. They're usually fairly quick and inexpensive. However, if you have serious medical issues or chronic medical problems, these are probably not your best option.  No Primary Care Doctor: - Call Health Connect at  [phone] - they can help you locate a primary care doctor that  accepts  your insurance, provides certain services, etc. - Physician Referral Service- [phone]  Chronic Pain Problems: Organization         Address  Phone   Notes  [facility]  [phone] Patients need to be referred by their primary care doctor.   Medication Assistance: Organization         Address  Phone   Notes  [facility] Medication [facility] [place] [place], [place] [address] [phone] --Must be a resident of [facility] -- Must have NO insurance coverage whatsoever (no Medicaid/ Medicare, etc.) -- The pt. MUST have a primary care doctor that directs their care regularly and follows them in the community   [facility]  [phone]   [employer]  [phone]    Agencies that provide inexpensive medical care: Organization         Address  Phone   Notes  [place]  [phone]   [facility]    [phone]   [facility] [place], [place] [address] [phone]   [place] [address] [place] [phone]   Planned Parenthood    [phone]   [facility]    [phone]   [place]  and [address], [place] Phone:  [phone], Fax:  [phone] Hours of Operation:  9 am - 6 pm, M-F.  Also accepts Medicaid/Medicare and self-pay.  [facility] for [place], [address], [place] Phone: [phone], Fax: [phone]. Hours of Operation:  8:30 am - 5:30 pm, M-F.  Also accepts Medicaid and self-pay.  [facility] [place] [address], [place] Phone: [phone]   [place], [place], [place] [phone], Ext. [phone] Mondays & Thursdays: 7-9 AM.  First 15 patients are seen on a first come, first serve basis.    [place] Providers:  Organization         Address  Phone   Notes  [facility] [address], [place] [phone] Also accepts self-pay patients.  [facility] [address], [place]  [phone]   [place], [address], [place] [phone]   [facility] [address] [place] [phone]   Lucianne Lei [address], [place]   [phone] Only accepts [place] patients after they have their name applied to their card.   Self-Pay (no insurance) in [facility]:  Organization         Address  Phone   Notes  Sickle Cell Patients, [facility] [place] [phone]   [facility] [place] [phone]   [facility] [place]  [place], [place], [place] [phone]   [facility]/Dr. Osei-Bonsu  [address] [place] or [address], [place] [phone] Phone number for both [place] and [place] locations is the same.  [facility] [address] [place] [phone]   [place], [place] or [address] [phone]   [facility]  [address] [place] [phone], phone; [phone], fax Sees patients 1st and 3rd Saturday of every month.  Must not qualify for public or private insurance (i.e. Medicaid, Medicare, [place] Health Choice, Veterans' Benefits)  Household income should be no more than 200% of the poverty level The clinic cannot treat you if you are pregnant or think you are pregnant  Sexually transmitted diseases are not treated at the clinic.    Dental Care: Organization         Address  Phone  Notes  [facility] Department of [facility] [address] [place], [place] [phone] Accepts children up to age 51 who are enrolled in [place] or [place] Health Choice; pregnant women with a Medicaid card; and children who have applied for Medicaid or [place] Health Choice, but were declined, whose parents can pay a reduced fee at time of service.  [facility] Department of [facility]  [address], [place] [phone] Accepts children up to age 66 who are enrolled in [place] or [place] Health Choice; pregnant women with a Medicaid card; and children who have applied for Medicaid or [place] Health Choice, but were declined, whose parents can pay a reduced fee at time of service.  [facility]  [address] [place], [place] [phone] Patients are seen by appointment only. Walk-ins are not accepted. [facility] will see patients 29 years of age and older. Monday - Tuesday (8am-5pm) Most Wednesdays (8:30-5pm) $30 per visit, cash only  [facility]  [address], [facility] [phone] Patients are seen by appointment only. Walk-ins are not accepted. [facility] will see patients 42 years of age and older. One Wednesday Evening (Monthly: Volunteer Based).  $30 per visit, cash only   [employer]  [phone] for adults; Children under age 30, call Graduate Pediatric Dentistry at [phone]. Children aged 64-14, please call [phone] to request a pediatric application.  Dental services are provided in all areas of dental care including fillings, crowns and bridges, complete and partial dentures, implants, gum treatment, root canals, and extractions. Preventive care is also provided. Treatment is provided to both adults and children. Patients are selected via a lottery and there is often a waiting list.   [facility] [address] [place]  [phone] [email or web address]   [facility] [address] [place], [place] [phone], Ext. [phone] Second and Fourth Thursday of each month, opens at 6:30 AM; Clinic ends at 9 AM.  Patients are seen on a first-come first-served basis, and a limited number are seen during each clinic.   [facility]  [address] Ether Griffins [place], [place] [phone]   Eligibility Requirements You must have lived in [place], [place], or [place] counties for at least the last three months.   You cannot be eligible for state or federal sponsored [place], including [employer], [place], or [employer].   You generally cannot be eligible for healthcare insurance through your employer.    How to apply: Eligibility screenings are held every Tuesday and Wednesday afternoon from 1:00 pm until 4:00 pm. You do not need an appointment for the interview!  [facility] [address] [place], [place] [phone]   [facility] Health Department  [phone]   [facility] Health Department  [phone]   [facility] Health Department  [phone]  Behavioral Health Resources in the Community: Intensive Outpatient Programs Organization         Address  Phone  Notes  [facility] Services [address], [place],  [place] [phone]   [facility] Outpatient [address], [place], [place] [phone]   [facility] [address], [place], [place]  [phone]   [facility] Mental Health [address],  [place], [place] [phone] or [phone]   Substance Abuse Resources Organization         Address  Phone  Notes  [facility]  [phone]   [facility]  [phone]   [facility] [place]  [phone]   Floydene Flock  [phone]   Residential & Outpatient Substance Abuse Program  [phone]   Psychological Services Organization         Address  Phone  Notes  [facility]  [phone]   [facility]  [phone]   [facility] Mental Health [address], [place] [phone] or [phone]    Mobile Crisis Teams Organization         Address  Phone  Notes  [facility], Mobile Crisis Care Unit  [phone]   [facility]  [address] [place], [place] [phone]   Doristine Locks [address] [place] [phone]    Self-Help/Support Groups Organization         Address  Phone             Notes  [facility]oc[facility] of [place] - variety of support groups  336- I7437963 Call for more information  Narcotics Anonymous (NA), [facility] [address], [employer] [place]  2 meetings at this location   Statistician         Address  Phone  Notes  [facility] [address],    [place]  [phone]   [facility]  [address], [place] [address], [place], [place] [phone]   [facility] [address] [place], [place] [phone] Admissions: 8am-3pm M-F  [facility] [address],    [place], [place] [phone]   [facility] [address] [place], [place], [place] [phone]   [facility] [address],  [place], [place] [phone]   [facility] - Intensive Outpatient [address] Laurell [address], [place], [place] [phone]   [facility] ([facility] [address] [place]  [place], [place] [phone] or [phone]   [facility] ([facility]) [address], [place], [place] [phone] Accepts Medicaid  [facility] [place] [address],  [place] [phone] Substance Abuse/Addiction Treatment   [facility] Organization         Address  Phone  Notes  [facility]  [phone]   Angie Fava, PhD [address] Ervin Knack [place], [place]   [phone] or [phone]   [facility]   [address] [place], [place] [phone]   [facility] [address], [place], [place] [phone] Insurance/Medicaid/sponsorship through [employer] and Families [address]                                    [place], [place] [phone] Therapy/tele-psych/case  [facility] [address]  [place], [place] [phone]    °Dr. Arfeen  ([phone]   °[facility] [place]  [employer] [place] [facility] 1) [address], [place] °2) [address], [place] °3)  [address], [place] [phone]) [phone] °(336) [phone] ° °([phone]) [phone]   °[place] Child Abuse Hotline (336) 342-1394 or (336) [phone] (After Hours)    ° ° ° °

## 2013-12-29 NOTE — ED Notes (Signed)
Patient states he has been SOB for a few days, states this feels like his asthma flare ups in the past. Patient states he is out of his inhaler for about 1 week.

## 2013-12-29 NOTE — ED Provider Notes (Signed)
CSN: [id number]     Arrival date & time 12/29/13  2029 History  This chart was scribed for non-physician practitioner Abigail Butts, PA-C working with Tanna Furry, MD by Zettie Pho, ED Scribe. This patient was seen in room WTR5/WTR5 and the patient's care was started at 9:04 PM.    Chief Complaint  Patient presents with  . Shortness of Breath  . Hip Pain   The history is provided by the patient and medical records. No language interpreter was used.   HPI Comments: Douglas Sloan is a 33 y.o. male with a history of asthma who presents to the Emergency Department complaining of a productive cough with [place]/green-colored sputum with associated chest tightness and exertional shortness of breath onset about a week ago that he states has been progressively worsening. Patient reports some associated intermittent congestion and diffuse headache. Patient reports that he ran out of his albuterol inhaler about a week ago and denies taking any other medications at home to treat his symptoms. He denies fever, chills, chest pain.   Patient is also complaining of an intermittent pain to the posterior, left hip that he states has been ongoing for the past 3 weeks. He states that the pain will intermittently radiate down the legs and is exacerbated with sitting down. 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 He denies taking any medications at home to treat his pain. He denies saddle anesthesia, gait disturbance or loss of bowel/bladder control.    Past Medical History  Diagnosis Date  . Asthma    Past Surgical History  Procedure Laterality Date  . Facial cosmetic surgery      reconstructive  . Appendectomy     Family History  Problem Relation Age of Onset  . Diabetes Other    History  Substance Use Topics  . Smoking status: Current Every Day Smoker -- 0.50 packs/day    Types: Cigarettes  . Smokeless tobacco: Not on file  . Alcohol Use: No    Review of Systems  Constitutional:  Negative for fever and chills.  HENT: Positive for congestion, postnasal drip and rhinorrhea.   Eyes: Negative for visual disturbance.  Respiratory: Positive for cough, chest tightness and shortness of breath (exertional).   Cardiovascular: Negative for chest pain.  Gastrointestinal: Negative for nausea, vomiting, diarrhea and abdominal distention.  Genitourinary: Negative for dysuria.  Musculoskeletal: Positive for arthralgias (L hip) and back pain (low).  Skin: Negative for color change and wound.  Allergic/Immunologic: Negative for immunocompromised state.  Neurological: Negative for headaches.  Hematological: Does not bruise/bleed easily.  Psychiatric/Behavioral: The patient is not nervous/anxious.   All other systems reviewed and are negative.   Allergies  Review of patient's allergies indicates no known allergies.  Home Medications   Current Outpatient Rx  Name  Route  Sig  Dispense  Refill  . albuterol (PROVENTIL HFA;VENTOLIN HFA) 108 (90 BASE) MCG/ACT inhaler   Inhalation   Inhale 2 puffs into the lungs every 6 (six) hours as needed. For shortness of breath         . albuterol (PROVENTIL HFA;VENTOLIN HFA) 108 (90 BASE) MCG/ACT inhaler   Inhalation   Inhale 2 puffs into the lungs every 4 (four) hours as needed for wheezing or shortness of breath.   1 Inhaler   3   . albuterol (PROVENTIL) (2.5 MG/3ML) 0.083% nebulizer solution   Nebulization   Take 3 mLs (2.5 mg total) by nebulization every 4 (four) hours as needed for wheezing or shortness of breath.  30 vial   0   . ibuprofen (ADVIL,MOTRIN) 800 MG tablet   Oral   Take 1 tablet (800 mg total) by mouth 3 (three) times daily.   21 tablet   0   . methocarbamol (ROBAXIN) 500 MG tablet   Oral   Take 1 tablet (500 mg total) by mouth 2 (two) times daily.   20 tablet   0   . predniSONE (DELTASONE) 20 MG tablet   Oral   Take 2 tablets (40 mg total) by mouth daily.   10 tablet   0    Triage Vitals: BP 140/70   Pulse 85  Temp(Src) 98.1 F (36.7 C) (Oral)  Resp 18  SpO2 98%  Physical Exam  Nursing note and vitals reviewed. Constitutional: He is oriented to person, place, and time. He appears well-developed and well-nourished. No distress.  Awake, alert, nontoxic appearance  HENT:  Head: Normocephalic and atraumatic.  Right Ear: Tympanic membrane, external ear and ear canal normal.  Left Ear: Tympanic membrane, external ear and ear canal normal.  Nose: Mucosal edema and rhinorrhea present. No epistaxis. Right sinus exhibits no maxillary sinus tenderness and no frontal sinus tenderness. Left sinus exhibits no maxillary sinus tenderness and no frontal sinus tenderness.  Mouth/Throat: Uvula is midline, oropharynx is clear and moist and mucous membranes are normal. Mucous membranes are not pale and not cyanotic. No oropharyngeal exudate, posterior oropharyngeal edema, posterior oropharyngeal erythema or tonsillar abscesses.  Eyes: Conjunctivae are normal. Pupils are equal, round, and reactive to light. No scleral icterus.  Neck: Normal range of motion and full passive range of motion without pain. Neck supple.  Full ROM without pain  Cardiovascular: Normal rate, regular rhythm, normal heart sounds and intact distal pulses.   No murmur heard. No tachycardia  Pulmonary/Chest: Effort normal. No accessory muscle usage or stridor. Not tachypneic. No respiratory distress. He has decreased breath sounds. He has no wheezes. He has no rhonchi. He has no rales. He exhibits no tenderness and no bony tenderness.  Breath sounds diminished throughout.   Abdominal: Soft. Bowel sounds are normal. He exhibits no distension and no mass. There is no tenderness. There is no rebound and no guarding.  Musculoskeletal: Normal range of motion. He exhibits no edema.  Full range of motion of the T-spine and L-spine No tenderness to palpation of the spinous processes of the T-spine or L-spine Mild tenderness to palpation of  the left paraspinous muscles of the L-spine Reproducible pain with palpation to the left buttock  Lymphadenopathy:    He has no cervical adenopathy.  Neurological: He is alert and oriented to person, place, and time. He has normal reflexes. He displays normal reflexes. He exhibits normal muscle tone. Coordination normal.  Speech is clear and goal oriented, follows commands Normal strength in upper and lower extremities bilaterally including dorsiflexion and plantar flexion, strong and equal grip strength Sensation normal to light and sharp touch Moves extremities without ataxia, coordination intact Normal gait Normal balance   Skin: Skin is warm and dry. No rash noted. He is not diaphoretic. No erythema.  Psychiatric: He has a normal mood and affect. His behavior is normal.    ED Course  Procedures (including critical care time)  DIAGNOSTIC STUDIES: Oxygen Saturation is 98% on room air, normal by my interpretation.    COORDINATION OF CARE: 9:08 PM- Discussed that symptoms are likely viral in nature and exacerbating his asthma. Will order a chest x-ray to rule out pneumonia.  Patient is requesting  a breathing treatment, so will order albuterol nebulizer. Discussed that the hip pain is likely due to sciatica. Will discharge patient with ibuprofen and Robaxin to manage symptoms. Discussed treatment plan with patient at bedside and patient verbalized agreement.   9:58 PM- Discussed that x-ray results were negative. Patient reports feeling much better after receiving the breathing treatment. Improved breath sounds with no wheezing, rales, or rhonchi. Will discharge patient with an albuterol inhaler and a refill of his nebulizer treatments. Will discharge patient with resources to help him better manage his asthma. Discussed treatment plan with patient at bedside and patient verbalized agreement.    Labs Review Labs Reviewed - No data to display  Imaging Review Dg Chest 2 View  12/29/2013    CLINICAL DATA:  Productive cough.  Sternal pressure.  EXAM: CHEST  2 VIEW  COMPARISON:  Two-view chest 08/14/2013.  FINDINGS: The heart size and mediastinal contours are within normal limits. Both lungs are clear. The visualized skeletal structures are unremarkable.  IMPRESSION: Negative two view chest.   Electronically Signed   By: Lawrence Santiago M.D.   On: 12/29/2013 21:36     EKG Interpretation None      MDM   Final diagnoses:  Asthma exacerbation  Viral URI with cough  Sciatica of left side  Low back pain   Douglas Sloan presents with several complaints.  He reports URI ssx for approx 1 week.  Pt CXR negative for acute infiltrate. I personally reviewed the imaging tests through PACS system.  I reviewed available ER/hospitalization records through the EMR.  Pt with improved lung sounds after albuterol treatment.  Patient ambulated in ED with O2 saturations maintained >90, no current signs of respiratory distress. Prednisone given in the ED and pt will bd dc with 5 day burst. Pt states they are breathing at baseline. Pt has been instructed to continue using prescribed medications and to obtain a PCP for better asthma management.    Patients other symptoms are consistent with URI, likely viral etiology. Discussed that antibiotics are not indicated for viral infections. Pt will be discharged with symptomatic treatment.  Pt is hemodynamically stable & in NAD prior to dc.  Pt also with low back pain radiating into his left hip.  Normal neurological exam, no evidence of urinary incontinence or retention, pain is consistently reproducible. There is no evidence of AAA or concern for dissection at this time.   Patient can walk without difficulty.  No loss of bowel or bladder control.  No concern for cauda equina.  No fever, night sweats, weight loss, h/o cancer, IVDU.  Pain treated here in the department with adequate improvement. RICE protocol and pain medicine indicated and discussed with patient. I  have also discussed reasons to return immediately to the ER.  Patient expresses understanding and agrees with plan.  It has been determined that no acute conditions requiring further emergency intervention are present at this time. The patient/guardian have been advised of the diagnosis and plan. We have discussed signs and symptoms that warrant return to the ED, such as changes or worsening in symptoms.   Vital signs are stable at discharge.   BP 140/70  Pulse 85  Temp(Src) 98.1 F (36.7 C) (Oral)  Resp 18  SpO2 98%  Patient/guardian has voiced understanding and agreed to follow-up with the PCP or specialist.    I personally performed the services described in this documentation, which was scribed in my presence. The recorded information has been reviewed and is  accurate.     Jarrett Soho Taliesin Hartlage, PA-C 12/29/13 2208

## 2013-12-29 NOTE — ED Notes (Signed)
Pharmacy Tech at bedside  

## 2013-12-29 NOTE — ED Notes (Signed)
RT notified of pending neb treatment.

## 2013-12-29 NOTE — ED Notes (Signed)
Pt states for the past week has had L hip numbness, shortness of breath, cough w/ [place]/greenish sputum, chest tightness, states is out of his inhaler at home, hx of asthma.

## 2014-01-05 NOTE — ED Provider Notes (Signed)
Medical screening examination/treatment/procedure(s) were performed by non-physician practitioner and as supervising physician I was immediately available for consultation/collaboration.   EKG Interpretation None        Tanna Furry, MD 01/05/14 1051

## 2014-03-12 ENCOUNTER — Emergency Department (HOSPITAL_COMMUNITY): Payer: Self-pay

## 2014-03-12 ENCOUNTER — Encounter (HOSPITAL_COMMUNITY): Payer: Self-pay | Admitting: Emergency Medicine

## 2014-03-12 ENCOUNTER — Emergency Department (HOSPITAL_COMMUNITY)
Admission: EM | Admit: 2014-03-12 | Discharge: 2014-03-13 | Disposition: A | Discharge status: Home / Self Care | Payer: Self-pay | Attending: Emergency Medicine | Admitting: Emergency Medicine

## 2014-03-12 DIAGNOSIS — Y9389 Activity, other specified: Secondary | ICD-10-CM | POA: Insufficient documentation

## 2014-03-12 DIAGNOSIS — R000 Tachycardia, unspecified: Secondary | ICD-10-CM | POA: Insufficient documentation

## 2014-03-12 DIAGNOSIS — S59909A Unspecified injury of unspecified elbow, initial encounter: Secondary | ICD-10-CM | POA: Insufficient documentation

## 2014-03-12 DIAGNOSIS — S6990XA Unspecified injury of unspecified wrist, hand and finger(s), initial encounter: Secondary | ICD-10-CM | POA: Insufficient documentation

## 2014-03-12 DIAGNOSIS — S82409A Unspecified fracture of shaft of unspecified fibula, initial encounter for closed fracture: Secondary | ICD-10-CM | POA: Insufficient documentation

## 2014-03-12 DIAGNOSIS — J45909 Unspecified asthma, uncomplicated: Secondary | ICD-10-CM | POA: Insufficient documentation

## 2014-03-12 DIAGNOSIS — Y92410 Unspecified street and highway as the place of occurrence of the external cause: Secondary | ICD-10-CM | POA: Insufficient documentation

## 2014-03-12 DIAGNOSIS — Z79899 Other long term (current) drug therapy: Secondary | ICD-10-CM | POA: Insufficient documentation

## 2014-03-12 DIAGNOSIS — R1032 Left lower quadrant pain: Secondary | ICD-10-CM | POA: Insufficient documentation

## 2014-03-12 DIAGNOSIS — Z791 Long term (current) use of non-steroidal anti-inflammatories (NSAID): Secondary | ICD-10-CM | POA: Insufficient documentation

## 2014-03-12 DIAGNOSIS — F17200 Nicotine dependence, unspecified, uncomplicated: Secondary | ICD-10-CM | POA: Insufficient documentation

## 2014-03-12 DIAGNOSIS — S3981XA Other specified injuries of abdomen, initial encounter: Secondary | ICD-10-CM | POA: Insufficient documentation

## 2014-03-12 DIAGNOSIS — IMO0002 Reserved for concepts with insufficient information to code with codable children: Secondary | ICD-10-CM | POA: Insufficient documentation

## 2014-03-12 DIAGNOSIS — S59919A Unspecified injury of unspecified forearm, initial encounter: Secondary | ICD-10-CM

## 2014-03-12 HISTORY — DX: Schizophrenia, unspecified: F20.9

## 2014-03-12 LAB — COMPREHENSIVE METABOLIC PANEL
ALK PHOS: 74 U/L (ref 39–117)
ALT: 52 U/L (ref 0–53)
AST: 47 U/L — ABNORMAL HIGH (ref 0–37)
Albumin: 4 g/dL (ref 3.5–5.2)
BUN: 11 mg/dL (ref 6–23)
CO2: 24 mEq/L (ref 19–32)
Calcium: 9.3 mg/dL (ref 8.4–10.5)
Chloride: 105 mEq/L (ref 96–112)
Creatinine, Ser: 0.98 mg/dL (ref 0.50–1.35)
Glucose, Bld: 104 mg/dL — ABNORMAL HIGH (ref 70–99)
Potassium: 3.8 mEq/L (ref 3.7–5.3)
SODIUM: 141 meq/L (ref 137–147)
Total Bilirubin: 0.4 mg/dL (ref 0.3–1.2)
Total Protein: 7 g/dL (ref 6.0–8.3)

## 2014-03-12 LAB — PROTIME-INR
INR: 0.87 (ref 0.00–1.49)
PROTHROMBIN TIME: 11.7 s (ref 11.6–15.2)

## 2014-03-12 LAB — CBC
HCT: 42.1 % (ref 39.0–52.0)
Hemoglobin: 14.6 g/dL (ref 13.0–17.0)
MCH: 30.4 pg (ref 26.0–34.0)
MCHC: 34.7 g/dL (ref 30.0–36.0)
MCV: 87.7 fL (ref 78.0–100.0)
PLATELETS: 257 10*3/uL (ref 150–400)
RBC: 4.8 MIL/uL (ref 4.22–5.81)
RDW: 12.9 % (ref 11.5–15.5)
WBC: 12.8 10*3/uL — AB (ref 4.0–10.5)

## 2014-03-12 LAB — ETHANOL: Alcohol, Ethyl (B): <11 mg/dL (ref 0–11)

## 2014-03-12 LAB — URINALYSIS, ROUTINE W REFLEX MICROSCOPIC
Glucose, UA: NEGATIVE mg/dL
Hgb urine dipstick: NEGATIVE
Ketones, ur: NEGATIVE mg/dL
Leukocytes, UA: NEGATIVE
Nitrite: NEGATIVE
PROTEIN: NEGATIVE mg/dL
Specific Gravity, Urine: 1.02 (ref 1.005–1.030)
Urobilinogen, UA: 1 mg/dL (ref 0.0–1.0)
pH: 5.5 (ref 5.0–8.0)

## 2014-03-12 LAB — CDS SEROLOGY

## 2014-03-12 LAB — SAMPLE TO BLOOD BANK

## 2014-03-12 MED ORDER — ONDANSETRON HCL 4 MG/2ML IJ SOLN
4.0000 mg | Freq: Once | INTRAMUSCULAR | Status: AC
  Administered 2014-03-12: 4 mg via INTRAVENOUS
  Filled 2014-03-12: qty 2

## 2014-03-12 MED ORDER — HYDROMORPHONE HCL PF 1 MG/ML IJ SOLN
1.0000 mg | Freq: Once | INTRAMUSCULAR | Status: AC
  Administered 2014-03-12: 1 mg via INTRAVENOUS
  Filled 2014-03-12: qty 1

## 2014-03-12 MED ORDER — HYDROCODONE-ACETAMINOPHEN 5-325 MG PO TABS: 2.0000 | ORAL_TABLET | ORAL | Status: DC | PRN

## 2014-03-12 MED ORDER — SODIUM CHLORIDE 0.9 % IV BOLUS (SEPSIS)
1000.0000 mL | Freq: Once | INTRAVENOUS | Status: AC
  Administered 2014-03-12: 1000 mL via INTRAVENOUS

## 2014-03-12 MED ORDER — IOHEXOL 300 MG/ML  SOLN
100.0000 mL | Freq: Once | INTRAMUSCULAR | Status: AC | PRN
  Administered 2014-03-12: 100 mL via INTRAVENOUS

## 2014-03-12 MED ORDER — NAPROXEN 500 MG PO TABS: 500.0000 mg | ORAL_TABLET | Freq: Two times a day (BID) | ORAL | Status: DC

## 2014-03-12 NOTE — ED Notes (Signed)
Ortho tech returned phone call and en route to ED.
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
## 2014-03-12 NOTE — ED Notes (Signed)
Ortho tech paged  

## 2014-03-12 NOTE — ED Notes (Signed)
Patient returned from CT at this time.  Family brought to bedside.

## 2014-03-12 NOTE — ED Notes (Signed)
Dr. Byerly at bedside.  

## 2014-03-12 NOTE — ED Notes (Signed)
Family at beside. Family given emotional support. 
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
## 2014-03-12 NOTE — ED Notes (Signed)
Patient to CT at this time

## 2014-03-12 NOTE — ED Notes (Signed)
Xray at bedside for portable xrays.
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
## 2014-03-13 MED ORDER — NAPROXEN 250 MG PO TABS
500.0000 mg | ORAL_TABLET | Freq: Once | ORAL | Status: AC
  Administered 2014-03-13: 500 mg via ORAL
  Filled 2014-03-13: qty 2

## 2014-03-13 MED ORDER — HYDROCODONE-ACETAMINOPHEN 5-325 MG PO TABS
1.0000 | ORAL_TABLET | Freq: Once | ORAL | Status: AC
  Administered 2014-03-13: 1 via ORAL
  Filled 2014-03-13: qty 1

## 2014-03-13 NOTE — ED Provider Notes (Signed)
I saw and evaluated the patient, reviewed the resident's note and I agree with the findings and plan.   EKG Interpretation None        Houston Siren III, MD 03/13/14 1248

## 2014-03-13 NOTE — Discharge Instructions (Signed)
Cast or Splint Care Casts and splints support injured limbs and keep bones from moving while they heal. It is important to care for your cast or splint at home.  HOME CARE INSTRUCTIONS  Keep the cast or splint uncovered during the drying period. It can take 24 to 48 hours to dry if it is made of plaster. A fiberglass cast will dry in less than 1 hour.  Do not rest the cast on anything harder than a pillow for the first 24 hours.  Do not put weight on your injured limb or apply pressure to the cast until your health care provider gives you permission.  Keep the cast or splint dry. Wet casts or splints can lose their shape and may not support the limb as well. A wet cast that has lost its shape can also create harmful pressure on your skin when it dries. Also, wet skin can become infected.  Cover the cast or splint with a plastic bag when bathing or when out in the rain or snow. If the cast is on the trunk of the body, take sponge baths until the cast is removed.  If your cast does become wet, dry it with a towel or a blow dryer on the cool setting only.  Keep your cast or splint clean. Soiled casts may be wiped with a moistened cloth.  Do not place any hard or soft foreign objects under your cast or splint, such as cotton, toilet paper, lotion, or powder.  Do not try to scratch the skin under the cast with any object. The object could get stuck inside the cast. Also, scratching could lead to an infection. If itching is a problem, use a blow dryer on a cool setting to relieve discomfort.  Do not trim or cut your cast or remove padding from inside of it.  Exercise all joints next to the injury that are not immobilized by the cast or splint. For example, if you have a long leg cast, exercise the hip joint and toes. If you have an arm cast or splint, exercise the shoulder, elbow, thumb, and fingers.  Elevate your injured arm or leg on 1 or 2 pillows for the first 1 to 3 days to decrease  swelling and pain.It is best if you can comfortably elevate your cast so it is higher than your heart. SEEK MEDICAL CARE IF:   Your cast or splint cracks.  Your cast or splint is too tight or too loose.  You have unbearable itching inside the cast.  Your cast becomes wet or develops a soft spot or area.  You have a bad smell coming from inside your cast.  You get an object stuck under your cast.  Your skin around the cast becomes red or raw.  You have new pain or worsening pain after the cast has been applied. SEEK IMMEDIATE MEDICAL CARE IF:   You have fluid leaking through the cast.  You are unable to move your fingers or toes.  You have discolored (blue or white), cool, painful, or very swollen fingers or toes beyond the cast.  You have tingling or numbness around the injured area.  You have severe pain or pressure under the cast.  You have any difficulty with your breathing or have shortness of breath.  You have chest pain. Document Released: 10/10/2000 Document Revised: 08/03/2013 Document Reviewed: 04/21/2013 [facility] Patient Information 2014 [place].    You may bear weight as tolerated on right foot.  Keep right  leg elevated when not ambulating.

## 2014-03-16 ENCOUNTER — Ambulatory Visit (HOSPITAL_COMMUNITY)
Admission: RE | Admit: 2014-03-16 | Discharge: 2014-03-16 | Disposition: A | Discharge status: Home / Self Care | Payer: Self-pay | Source: Ambulatory Visit | Attending: Cardiology | Admitting: Cardiology

## 2014-03-16 ENCOUNTER — Other Ambulatory Visit (HOSPITAL_COMMUNITY): Payer: Self-pay | Admitting: Orthopedic Surgery

## 2014-03-16 DIAGNOSIS — M79609 Pain in unspecified limb: Secondary | ICD-10-CM

## 2014-03-16 NOTE — Progress Notes (Signed)
Left Lower Extremity Venous Duplex Completed. No evidence for DVT or SVT. °Brianna L Mazza,RVT °

## 2014-03-21 ENCOUNTER — Emergency Department (HOSPITAL_COMMUNITY)
Admission: EM | Admit: 2014-03-21 | Discharge: 2014-03-21 | Disposition: A | Discharge status: Home / Self Care | Payer: Self-pay | Attending: Emergency Medicine | Admitting: Emergency Medicine

## 2014-03-21 ENCOUNTER — Encounter (HOSPITAL_COMMUNITY): Payer: Self-pay | Admitting: Emergency Medicine

## 2014-03-21 DIAGNOSIS — Z791 Long term (current) use of non-steroidal anti-inflammatories (NSAID): Secondary | ICD-10-CM | POA: Insufficient documentation

## 2014-03-21 DIAGNOSIS — J45909 Unspecified asthma, uncomplicated: Secondary | ICD-10-CM | POA: Insufficient documentation

## 2014-03-21 DIAGNOSIS — S82409A Unspecified fracture of shaft of unspecified fibula, initial encounter for closed fracture: Secondary | ICD-10-CM

## 2014-03-21 DIAGNOSIS — F17200 Nicotine dependence, unspecified, uncomplicated: Secondary | ICD-10-CM | POA: Insufficient documentation

## 2014-03-21 DIAGNOSIS — Z8659 Personal history of other mental and behavioral disorders: Secondary | ICD-10-CM | POA: Insufficient documentation

## 2014-03-21 DIAGNOSIS — S8290XD Unspecified fracture of unspecified lower leg, subsequent encounter for closed fracture with routine healing: Secondary | ICD-10-CM | POA: Insufficient documentation

## 2014-03-21 MED ORDER — TRAMADOL HCL 50 MG PO TABS: 50.0000 mg | ORAL_TABLET | Freq: Four times a day (QID) | ORAL | Status: DC | PRN

## 2014-03-21 MED ORDER — KETOROLAC TROMETHAMINE 30 MG/ML IJ SOLN
60.0000 mg | Freq: Once | INTRAMUSCULAR | Status: AC
  Administered 2014-03-21: 60 mg via INTRAMUSCULAR
  Filled 2014-03-21: qty 2

## 2014-03-21 NOTE — Discharge Instructions (Signed)
Fibular Fracture, Adult, Treated Without Immobilization You have a fracture (break) of your fibula. This is the bone in your lower leg located on the outside of the leg. These fractures are easily diagnosed with x-rays. TREATMENT  You have a simple fracture of the part of the fibula which is located between the knee and ankle. This bone usually will heal without problems and can often be treated without casting or splinting. This means the fracture will heal well during normal use and daily activities without being held in place. Sometimes a cast or splint is placed on these fractures if it is needed for comfort or if the bones are badly out of place. HOME CARE INSTRUCTIONS   Apply ice to the injury for 15-20 minutes, 03-04 times per day while awake, for 2 days. Put the ice in a plastic bag and place a thin towel between the bag of ice and your leg. This helps keep swelling down.  Use crutches as directed. Resume walking without crutches as directed by your caregiver or when comfortable doing so.  Only take over-the-counter or prescription medicines for pain, discomfort, or fever as directed by your caregiver.  Your caregiver may tell you to take off your removable cam boot.  Keep appointments for follow up X-rays if these are required.  Warning: Do not drive a car or operate a motor vehicle until your caregiver specifically tells you it is safe to do so. SEEK MEDICAL CARE IF:   You have continued severe pain or more swelling.  The medications do not control the pain.  Your skin or nails below the injury turn blue or grey or feel cold or numb.  You develop severe pain in the leg or foot. MAKE SURE YOU:   Understand these instructions.  Will watch your condition.  Will get help right away if you are not doing well or get worse. Document Released: 10/13/2005 Document Revised: 01/05/2012 Document Reviewed: 05/19/2008 [facility] Patient Information 2014 [place].
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
## 2014-03-21 NOTE — ED Provider Notes (Signed)
Medical screening examination/treatment/procedure(s) were performed by non-physician practitioner and as supervising physician I was immediately available for consultation/collaboration.   EKG Interpretation None        Osvaldo Shipper, MD 03/21/14 [phone]
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
## 2014-03-21 NOTE — Progress Notes (Signed)
[facility] provided pt with list of primary care resources to help patient establish pcp.

## 2014-03-27 DIAGNOSIS — I82409 Acute embolism and thrombosis of unspecified deep veins of unspecified lower extremity: Secondary | ICD-10-CM

## 2014-03-27 HISTORY — DX: Acute embolism and thrombosis of unspecified deep veins of unspecified lower extremity: I82.409

## 2014-03-31 ENCOUNTER — Other Ambulatory Visit (HOSPITAL_COMMUNITY): Payer: Self-pay | Admitting: Orthopedic Surgery

## 2014-03-31 ENCOUNTER — Emergency Department (INDEPENDENT_AMBULATORY_CARE_PROVIDER_SITE_OTHER)
Admission: EM | Admit: 2014-03-31 | Discharge: 2014-03-31 | Disposition: A | Discharge status: Home / Self Care | Payer: Self-pay | Source: Home / Self Care | Attending: Family Medicine | Admitting: Family Medicine

## 2014-03-31 ENCOUNTER — Encounter (HOSPITAL_COMMUNITY): Payer: Self-pay | Admitting: Emergency Medicine

## 2014-03-31 ENCOUNTER — Ambulatory Visit (HOSPITAL_COMMUNITY)
Admission: RE | Admit: 2014-03-31 | Discharge: 2014-03-31 | Disposition: A | Discharge status: Home / Self Care | Payer: Self-pay | Source: Ambulatory Visit | Attending: Cardiovascular Disease | Admitting: Cardiovascular Disease

## 2014-03-31 DIAGNOSIS — I82409 Acute embolism and thrombosis of unspecified deep veins of unspecified lower extremity: Secondary | ICD-10-CM

## 2014-03-31 DIAGNOSIS — M7989 Other specified soft tissue disorders: Secondary | ICD-10-CM | POA: Insufficient documentation

## 2014-03-31 DIAGNOSIS — I82509 Chronic embolism and thrombosis of unspecified deep veins of unspecified lower extremity: Secondary | ICD-10-CM

## 2014-03-31 DIAGNOSIS — I82402 Acute embolism and thrombosis of unspecified deep veins of left lower extremity: Secondary | ICD-10-CM

## 2014-03-31 DIAGNOSIS — S83249A Other tear of medial meniscus, current injury, unspecified knee, initial encounter: Secondary | ICD-10-CM

## 2014-03-31 MED ORDER — RIVAROXABAN 15 MG PO TABS: 15.0000 mg | ORAL_TABLET | Freq: Two times a day (BID) | ORAL | Status: DC

## 2014-03-31 NOTE — ED Notes (Signed)
Pt here for follow up of left leg pain.  Recent dx with blood clot.

## 2014-03-31 NOTE — Progress Notes (Signed)
Left Lower Extremity Venous Duplex Completed. Evidence for occlusive thrombus in one of two grastroc veins. Douglas Sloan
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
## 2014-03-31 NOTE — Discharge Instructions (Signed)
Thank you for coming in today. START Xarelto today.  Follow up at the [facility] Monday morning at 945 Come back as needed.     Deep Vein Thrombosis A deep vein thrombosis (DVT) is a blood clot that develops in the deep, larger veins of the leg, arm, or pelvis. These are more dangerous than clots that might form in veins near the surface of the body. A DVT can lead to complications if the clot breaks off and travels in the bloodstream to the lungs.  A DVT can damage the valves in your leg veins, so that instead of flowing upward, the blood pools in the lower leg. This is called post-thrombotic syndrome, and it can result in pain, swelling, discoloration, and sores on the leg. CAUSES Usually, several things contribute to blood clots forming. Contributing factors include:  The flow of blood slows down.  The inside of the vein is damaged in some way.  You have a condition that makes blood clot more easily. RISK FACTORS Some people are more likely than others to develop blood clots. Risk factors include:   Older age, especially over 64 years of age.  Having a family history of blood clots or if you have already had a blot clot.  Having major or lengthy surgery. This is especially true for surgery on the hip, knee, or belly (abdomen). Hip surgery is particularly high risk.  Breaking a hip or leg.  Sitting or lying still for a long time. This includes long-distance travel, paralysis, or recovery from an illness or surgery.  Having cancer or cancer treatment.  Having a long, thin tube (catheter) placed inside a vein during a medical procedure.  Being overweight (obese).  Pregnancy and childbirth.  Hormone changes make the blood clot more easily during pregnancy.  The fetus puts pressure on the veins of the pelvis.  There is a risk of injury to veins during delivery or a caesarean. The risk is highest just after childbirth.  Medicines with  the male hormone estrogen. This includes birth control pills and hormone replacement therapy.  Smoking.  Other circulation or heart problems.  SIGNS AND SYMPTOMS When a clot forms, it can either partially or totally block the blood flow in that vein. Symptoms of a DVT can include:  Swelling of the leg or arm, especially if one side is much worse.  Warmth and redness of the leg or arm, especially if one side is much worse.  Pain in an arm or leg. If the clot is in the leg, symptoms may be more noticeable or worse when standing or walking. The symptoms of a DVT that has traveled to the lungs (pulmonary embolism, PE) usually start suddenly and include:  Shortness of breath.  Coughing.  Coughing up blood or blood-tinged phlegm.  Chest pain. The chest pain is often worse with deep breaths.  Rapid heartbeat. Anyone with these symptoms should get emergency medical treatment right away. Call your local emergency services (911 in the [place] if you have these symptoms. DIAGNOSIS If a DVT is suspected, your health care provider will take a full medical history and perform a physical exam. Tests that also may be required include:  Blood tests, including studies of the clotting properties of the blood.  Ultrasonography to see if you have clots in your legs or lungs.  X-rays to show the flow of blood when dye is injected into the veins (venography).  Studies of your lungs if you have  any chest symptoms. PREVENTION  Exercise the legs regularly. Take a brisk 30-minute walk every day.  Maintain a weight that is appropriate for your height.  Avoid sitting or lying in bed for long periods of time without moving your legs.  Women, particularly those over the age of 89 years, should consider the risks and benefits of taking estrogen medicines, including birth control pills.  Do not smoke, especially if you take estrogen medicines.  Long-distance travel can increase your risk of DVT. You  should exercise your legs by walking or pumping the muscles every hour.  In-hospital prevention:  Many of the risk factors above relate to situations that exist with hospitalization, either for illness, injury, or elective surgery.  Your health care provider will assess you for the need for venous thromboembolism prophylaxis when you are admitted to the hospital. If you are having surgery, your surgeon will assess you the day of or day after surgery.  Prevention may include medical and nonmedical measures. TREATMENT Once identified, a DVT can be treated. It can also be prevented in some circumstances. Once you have had a DVT, you may be at increased risk for a DVT in the future. The most common treatment for DVT is blood thinning (anticoagulant) medicine, which reduces the blood's tendency to clot. Anticoagulants can stop new blood clots from forming and stop old ones from growing. They cannot dissolve existing clots. Your body does this by itself over time. Anticoagulants can be given by mouth, by IV access, or by injection. Your health care provider will determine the best program for you. Other medicines or treatments that may be used are:  Heparin or related medicines (low molecular weight heparin) are usually the first treatment for a blood clot. They act quickly. However, they cannot be taken orally.  Heparin can cause a fall in a component of blood that stops bleeding and forms blood clots (platelets). You will be monitored with blood tests to be sure this does not occur.  Warfarin is an anticoagulant that can be swallowed. It takes a few days to start working, so usually heparin or related medicines are used in combination. Once warfarin is working, heparin is usually stopped.  Less commonly, clot dissolving drugs (thrombolytics) are used to dissolve a DVT. They carry a high risk of bleeding, so they are used mainly in severe cases, where your life or a limb is threatened.  Very rarely, a  blood clot in the leg needs to be removed surgically.  If you are unable to take anticoagulants, your health care provider may arrange for you to have a filter placed in a main vein in your abdomen. This filter prevents clots from traveling to your lungs. HOME CARE INSTRUCTIONS  Take all medicines prescribed by your health care provider. Only take over-the-counter or prescription medicines for pain, fever, or discomfort as directed by your health care provider.  Warfarin. Most people will continue taking warfarin after hospital discharge. Your health care provider will advise you on the length of treatment (usually 3 6 months, sometimes lifelong).  Too much and too little warfarin are both dangerous. Too much warfarin increases the risk of bleeding. Too little warfarin continues to allow the risk for blood clots. While taking warfarin, you will need to have regular blood tests to measure your blood clotting time. These blood tests usually include both the prothrombin time (PT) and international normalized ratio (INR) tests. The PT and INR results allow your health care provider to adjust your dose  of warfarin. The dose can change for many reasons. It is critically important that you take warfarin exactly as prescribed, and that you have your PT and INR levels drawn exactly as directed.  Many foods, especially foods high in vitamin K, can interfere with warfarin and affect the PT and INR results. Foods high in vitamin K include spinach, kale, broccoli, cabbage, collard and turnip greens, brussel sprouts, peas, cauliflower, seaweed, and parsley as well as beef and pork liver, green tea, and soybean oil. You should eat a consistent amount of foods high in vitamin K. Avoid major changes in your diet, or notify your health care provider before changing your diet. Arrange a visit with a dietitian to answer your questions.  Many medicines can interfere with warfarin and affect the PT and INR results. You must  tell your health care provider about any and all medicines you take. This includes all vitamins and supplements. Be especially cautious with aspirin and anti-inflammatory medicines. Ask your health care provider before taking these. Do not take or discontinue any prescribed or over-the-counter medicine except on the advice of your health care provider or pharmacist.  Warfarin can have side effects, primarily excessive bruising or bleeding. You will need to hold pressure over cuts for longer than usual. Your health care provider or pharmacist will discuss other potential side effects.  Alcohol can change the body's ability to handle warfarin. It is best to avoid alcoholic drinks or consume only very small amounts while taking warfarin. Notify your health care provider if you change your alcohol intake.  Notify your dentist or other health care providers before procedures.  Activity. Ask your health care provider how soon you can go back to normal activities. It is important to stay active to prevent blood clots. If you are on anticoagulant medicine, avoid contact sports.  Exercise. It is very important to exercise. This is especially important while traveling, sitting, or standing for long periods of time. Exercise your legs by walking or by pumping the muscles frequently. Take frequent walks.  Compression stockings. These are tight elastic stockings that apply pressure to the lower legs. This pressure can help keep the blood in the legs from clotting. You may need to wear compression stockings at home to help prevent a DVT.  Do not smoke. If you smoke, quit. Ask your health care provider for help with quitting smoking.  Learn as much as you can about DVT. Knowing more about the condition should help you keep it from coming back.  Wear a medical alert bracelet or carry a medical alert card. SEEK MEDICAL CARE IF:  You notice a rapid heartbeat.  You feel weaker or more tired than usual.  You  feel faint.  You notice increased bruising.  You feel your symptoms are not getting better in the time expected.  You believe you are having side effects of medicine. SEEK IMMEDIATE MEDICAL CARE IF:  You have chest pain.  You have trouble breathing.  You have new or increased swelling or pain in one leg.  You cough up blood.  You notice blood in vomit, in a bowel movement, or in urine. MAKE SURE YOU:  Understand these instructions.  Will watch your condition.  Will get help right away if you are not doing well or get worse. Document Released: 10/13/2005 Document Revised: 08/03/2013 Document Reviewed: 06/20/2013 [facility] Patient Information 2014 [place].

## 2014-04-01 ENCOUNTER — Ambulatory Visit (HOSPITAL_BASED_OUTPATIENT_CLINIC_OR_DEPARTMENT_OTHER): Payer: Self-pay

## 2014-04-03 ENCOUNTER — Ambulatory Visit: Payer: Self-pay | Attending: Internal Medicine | Admitting: Internal Medicine

## 2014-04-03 ENCOUNTER — Encounter: Payer: Self-pay | Admitting: Internal Medicine

## 2014-04-03 VITALS — BP 129/88 | HR 87 | Temp 98.8°F | Resp 17 | Wt 220.0 lb

## 2014-04-03 DIAGNOSIS — Z139 Encounter for screening, unspecified: Secondary | ICD-10-CM

## 2014-04-03 DIAGNOSIS — I82409 Acute embolism and thrombosis of unspecified deep veins of unspecified lower extremity: Secondary | ICD-10-CM | POA: Insufficient documentation

## 2014-04-03 DIAGNOSIS — F17200 Nicotine dependence, unspecified, uncomplicated: Secondary | ICD-10-CM | POA: Insufficient documentation

## 2014-04-03 DIAGNOSIS — Z7901 Long term (current) use of anticoagulants: Secondary | ICD-10-CM | POA: Insufficient documentation

## 2014-04-03 DIAGNOSIS — J45909 Unspecified asthma, uncomplicated: Secondary | ICD-10-CM | POA: Insufficient documentation

## 2014-04-03 DIAGNOSIS — Z79899 Other long term (current) drug therapy: Secondary | ICD-10-CM | POA: Insufficient documentation

## 2014-04-03 DIAGNOSIS — M79606 Pain in leg, unspecified: Secondary | ICD-10-CM

## 2014-04-03 DIAGNOSIS — S82402A Unspecified fracture of shaft of left fibula, initial encounter for closed fracture: Secondary | ICD-10-CM

## 2014-04-03 DIAGNOSIS — M79609 Pain in unspecified limb: Secondary | ICD-10-CM | POA: Insufficient documentation

## 2014-04-03 DIAGNOSIS — I82402 Acute embolism and thrombosis of unspecified deep veins of left lower extremity: Secondary | ICD-10-CM | POA: Insufficient documentation

## 2014-04-03 DIAGNOSIS — S82409A Unspecified fracture of shaft of unspecified fibula, initial encounter for closed fracture: Secondary | ICD-10-CM | POA: Insufficient documentation

## 2014-04-03 LAB — CBC WITH DIFFERENTIAL/PLATELET
Basophils Absolute: 0.1 10*3/uL (ref 0.0–0.1)
Basophils Relative: 1 % (ref 0–1)
Eosinophils Absolute: 0.2 10*3/uL (ref 0.0–0.7)
Eosinophils Relative: 2 % (ref 0–5)
HEMATOCRIT: 44.1 % (ref 39.0–52.0)
Hemoglobin: 14.9 g/dL (ref 13.0–17.0)
LYMPHS ABS: 2 10*3/uL (ref 0.7–4.0)
LYMPHS PCT: 23 % (ref 12–46)
MCH: 29.5 pg (ref 26.0–34.0)
MCHC: 33.8 g/dL (ref 30.0–36.0)
MCV: 87.3 fL (ref 78.0–100.0)
Monocytes Absolute: 0.5 10*3/uL (ref 0.1–1.0)
Monocytes Relative: 6 % (ref 3–12)
Neutro Abs: 6 10*3/uL (ref 1.7–7.7)
Neutrophils Relative %: 68 % (ref 43–77)
PLATELETS: 395 10*3/uL (ref 150–400)
RBC: 5.05 MIL/uL (ref 4.22–5.81)
RDW: 14.2 % (ref 11.5–15.5)
WBC: 8.8 10*3/uL (ref 4.0–10.5)

## 2014-04-03 LAB — COMPLETE METABOLIC PANEL WITH GFR
ALBUMIN: 4 g/dL (ref 3.5–5.2)
ALT: 56 U/L — AB (ref 0–53)
AST: 31 U/L (ref 0–37)
Alkaline Phosphatase: 102 U/L (ref 39–117)
BUN: 10 mg/dL (ref 6–23)
CALCIUM: 9.8 mg/dL (ref 8.4–10.5)
CHLORIDE: 101 meq/L (ref 96–112)
CO2: 27 meq/L (ref 19–32)
CREATININE: 0.93 mg/dL (ref 0.50–1.35)
GFR, Est Non African American: >89 mL/min
Glucose, Bld: 95 mg/dL (ref 70–99)
Potassium: 4.2 mEq/L (ref 3.5–5.3)
Sodium: 138 mEq/L (ref 135–145)
Total Bilirubin: 0.5 mg/dL (ref 0.2–1.2)
Total Protein: 6.7 g/dL (ref 6.0–8.3)

## 2014-04-03 LAB — TSH: TSH: 1.358 u[IU]/mL (ref 0.350–4.500)

## 2014-04-03 LAB — LIPID PANEL
Cholesterol: 129 mg/dL (ref 0–200)
HDL: 41 mg/dL (ref >39)
LDL CALC: 68 mg/dL (ref 0–99)
Total CHOL/HDL Ratio: 3.1 Ratio
Triglycerides: 102 mg/dL (ref <150)
VLDL: 20 mg/dL (ref 0–40)

## 2014-04-03 LAB — HEMOGLOBIN A1C
Hgb A1c MFr Bld: 5.6 % (ref <5.7)
MEAN PLASMA GLUCOSE: 114 mg/dL (ref <117)

## 2014-04-03 MED ORDER — GABAPENTIN 300 MG PO CAPS: 300.0000 mg | ORAL_CAPSULE | Freq: Three times a day (TID) | ORAL | Status: DC

## 2014-04-03 MED ORDER — TRAMADOL HCL 50 MG PO TABS: 50.0000 mg | ORAL_TABLET | Freq: Four times a day (QID) | ORAL | Status: DC | PRN

## 2014-04-03 MED ORDER — RIVAROXABAN 20 MG PO TABS: 20.0000 mg | ORAL_TABLET | Freq: Every day | ORAL | Status: DC
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
## 2014-04-03 NOTE — Progress Notes (Signed)
Patient here to establish care Currently on xarelto for thrombus in his leg

## 2014-04-04 ENCOUNTER — Telehealth: Payer: Self-pay

## 2014-04-04 LAB — VITAMIN D 25 HYDROXY (VIT D DEFICIENCY, FRACTURES): VIT D 25 HYDROXY: 20 ng/mL — AB (ref 30–89)

## 2014-04-04 MED ORDER — VITAMIN D (ERGOCALCIFEROL) 1.25 MG (50000 UNIT) PO CAPS: 50000.0000 [IU] | ORAL_CAPSULE | ORAL | Status: DC

## 2014-04-04 NOTE — Telephone Encounter (Signed)
Message copied by Dorothe Pea on Tue Apr 04, 2014 10:32 AM ------      Message from: Lorayne Marek      Created: Tue Apr 04, 2014  9:56 AM       Blood work reviewed, noticed low vitamin D, call patient advise to start ergocalciferol 50,000 units once a week for the duration of  12 weeks.       ------

## 2014-04-04 NOTE — Telephone Encounter (Signed)
Patient is aware of his lab results [facility] 

## 2014-04-05 ENCOUNTER — Telehealth (HOSPITAL_COMMUNITY): Payer: Self-pay | Admitting: *Deleted

## 2014-04-06 ENCOUNTER — Emergency Department (HOSPITAL_COMMUNITY): Payer: Self-pay

## 2014-04-06 ENCOUNTER — Emergency Department (HOSPITAL_COMMUNITY)
Admission: EM | Admit: 2014-04-06 | Discharge: 2014-04-06 | Discharge status: Against Medical Advice | Payer: Self-pay | Attending: Emergency Medicine | Admitting: Emergency Medicine

## 2014-04-06 DIAGNOSIS — R079 Chest pain, unspecified: Secondary | ICD-10-CM | POA: Insufficient documentation

## 2014-04-06 LAB — CBC
HCT: 43.9 % (ref 39.0–52.0)
Hemoglobin: 14.7 g/dL (ref 13.0–17.0)
MCH: 29.9 pg (ref 26.0–34.0)
MCHC: 33.5 g/dL (ref 30.0–36.0)
MCV: 89.2 fL (ref 78.0–100.0)
Platelets: 319 10*3/uL (ref 150–400)
RBC: 4.92 MIL/uL (ref 4.22–5.81)
RDW: 12.8 % (ref 11.5–15.5)
WBC: 8.8 10*3/uL (ref 4.0–10.5)

## 2014-04-06 LAB — BASIC METABOLIC PANEL
BUN: 11 mg/dL (ref 6–23)
CO2: 24 mEq/L (ref 19–32)
Calcium: 9.8 mg/dL (ref 8.4–10.5)
Chloride: 104 mEq/L (ref 96–112)
Creatinine, Ser: 0.93 mg/dL (ref 0.50–1.35)
GFR calc Af Amer: >90 mL/min (ref >90)
Glucose, Bld: 176 mg/dL — ABNORMAL HIGH (ref 70–99)
Potassium: 4.2 mEq/L (ref 3.7–5.3)
SODIUM: 143 meq/L (ref 137–147)

## 2014-04-06 LAB — TROPONIN I

## 2014-04-06 NOTE — ED Notes (Signed)
Pager found, pt chose to leave w/o informing staff

## 2014-04-06 NOTE — ED Notes (Signed)
No answer when  [facility]
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
## 2014-04-10 ENCOUNTER — Ambulatory Visit (HOSPITAL_COMMUNITY)
Admission: RE | Admit: 2014-04-10 | Discharge: 2014-04-10 | Disposition: A | Discharge status: Home / Self Care | Payer: Self-pay | Source: Ambulatory Visit | Attending: Orthopedic Surgery | Admitting: Orthopedic Surgery

## 2014-04-10 ENCOUNTER — Ambulatory Visit (HOSPITAL_COMMUNITY): Payer: Self-pay

## 2014-04-10 DIAGNOSIS — IMO0002 Reserved for concepts with insufficient information to code with codable children: Secondary | ICD-10-CM | POA: Insufficient documentation

## 2014-04-10 DIAGNOSIS — M25569 Pain in unspecified knee: Secondary | ICD-10-CM | POA: Insufficient documentation

## 2014-04-10 DIAGNOSIS — M25469 Effusion, unspecified knee: Secondary | ICD-10-CM | POA: Insufficient documentation

## 2014-04-10 DIAGNOSIS — S83509A Sprain of unspecified cruciate ligament of unspecified knee, initial encounter: Secondary | ICD-10-CM | POA: Insufficient documentation

## 2014-04-10 DIAGNOSIS — S83249A Other tear of medial meniscus, current injury, unspecified knee, initial encounter: Secondary | ICD-10-CM

## 2014-04-13 ENCOUNTER — Ambulatory Visit: Payer: Self-pay | Attending: Internal Medicine

## 2014-04-18 ENCOUNTER — Emergency Department (HOSPITAL_COMMUNITY)
Admission: EM | Admit: 2014-04-18 | Discharge: 2014-04-19 | Disposition: A | Discharge status: Home / Self Care | Payer: Self-pay | Attending: Emergency Medicine | Admitting: Emergency Medicine

## 2014-04-18 ENCOUNTER — Encounter (HOSPITAL_COMMUNITY): Payer: Self-pay | Admitting: Emergency Medicine

## 2014-04-18 DIAGNOSIS — R0609 Other forms of dyspnea: Secondary | ICD-10-CM | POA: Insufficient documentation

## 2014-04-18 DIAGNOSIS — I82409 Acute embolism and thrombosis of unspecified deep veins of unspecified lower extremity: Secondary | ICD-10-CM | POA: Insufficient documentation

## 2014-04-18 DIAGNOSIS — J45901 Unspecified asthma with (acute) exacerbation: Secondary | ICD-10-CM | POA: Insufficient documentation

## 2014-04-18 DIAGNOSIS — S8290XD Unspecified fracture of unspecified lower leg, subsequent encounter for closed fracture with routine healing: Secondary | ICD-10-CM | POA: Insufficient documentation

## 2014-04-18 DIAGNOSIS — R0989 Other specified symptoms and signs involving the circulatory and respiratory systems: Secondary | ICD-10-CM | POA: Insufficient documentation

## 2014-04-18 DIAGNOSIS — F17200 Nicotine dependence, unspecified, uncomplicated: Secondary | ICD-10-CM | POA: Insufficient documentation

## 2014-04-18 DIAGNOSIS — R21 Rash and other nonspecific skin eruption: Secondary | ICD-10-CM | POA: Insufficient documentation

## 2014-04-18 DIAGNOSIS — R0600 Dyspnea, unspecified: Secondary | ICD-10-CM

## 2014-04-18 DIAGNOSIS — F209 Schizophrenia, unspecified: Secondary | ICD-10-CM | POA: Insufficient documentation

## 2014-04-18 DIAGNOSIS — S82402S Unspecified fracture of shaft of left fibula, sequela: Secondary | ICD-10-CM

## 2014-04-18 DIAGNOSIS — Z7901 Long term (current) use of anticoagulants: Secondary | ICD-10-CM | POA: Insufficient documentation

## 2014-04-18 DIAGNOSIS — I82402 Acute embolism and thrombosis of unspecified deep veins of left lower extremity: Secondary | ICD-10-CM

## 2014-04-18 DIAGNOSIS — Z79899 Other long term (current) drug therapy: Secondary | ICD-10-CM | POA: Insufficient documentation

## 2014-04-18 HISTORY — DX: Acute embolism and thrombosis of unspecified deep veins of unspecified lower extremity: I82.409

## 2014-04-18 NOTE — ED Notes (Signed)
Per pt report: Pt reports headache and SOB for the past 3 days. Pt has a dx blood clot. Pt spoke with Dr.Brook and was advised to come to ED.  Pt reports left knee pain.  Pt a/o x 4.  Pt has a knee immobilizer on.

## 2014-04-19 ENCOUNTER — Encounter (HOSPITAL_COMMUNITY): Payer: Self-pay | Admitting: Radiology

## 2014-04-19 ENCOUNTER — Emergency Department (HOSPITAL_COMMUNITY): Payer: Self-pay

## 2014-04-19 LAB — BASIC METABOLIC PANEL
BUN: 10 mg/dL (ref 6–23)
CO2: 28 meq/L (ref 19–32)
CREATININE: 1.15 mg/dL (ref 0.50–1.35)
Calcium: 9.2 mg/dL (ref 8.4–10.5)
Chloride: 103 mEq/L (ref 96–112)
GFR, EST NON AFRICAN AMERICAN: 83 mL/min — AB (ref >90)
Glucose, Bld: 95 mg/dL (ref 70–99)
Potassium: 4.2 mEq/L (ref 3.7–5.3)
SODIUM: 142 meq/L (ref 137–147)

## 2014-04-19 LAB — URINALYSIS, ROUTINE W REFLEX MICROSCOPIC
Bilirubin Urine: NEGATIVE
GLUCOSE, UA: NEGATIVE mg/dL
Hgb urine dipstick: NEGATIVE
Ketones, ur: NEGATIVE mg/dL
LEUKOCYTES UA: NEGATIVE
Nitrite: NEGATIVE
PROTEIN: NEGATIVE mg/dL
Specific Gravity, Urine: 1.029 (ref 1.005–1.030)
UROBILINOGEN UA: 1 mg/dL (ref 0.0–1.0)
pH: 7.5 (ref 5.0–8.0)

## 2014-04-19 LAB — CBC
HCT: 40.9 % (ref 39.0–52.0)
Hemoglobin: 13.5 g/dL (ref 13.0–17.0)
MCH: 29.8 pg (ref 26.0–34.0)
MCHC: 33 g/dL (ref 30.0–36.0)
MCV: 90.3 fL (ref 78.0–100.0)
PLATELETS: 267 10*3/uL (ref 150–400)
RBC: 4.53 MIL/uL (ref 4.22–5.81)
RDW: 13.5 % (ref 11.5–15.5)
WBC: 9.5 10*3/uL (ref 4.0–10.5)

## 2014-04-19 MED ORDER — TRAMADOL HCL 50 MG PO TABS
50.0000 mg | ORAL_TABLET | Freq: Four times a day (QID) | ORAL | Status: DC | PRN
Start: 2014-04-19 — End: 2014-04-19
  Administered 2014-04-19: 50 mg via ORAL
  Filled 2014-04-19: qty 1

## 2014-04-19 MED ORDER — HYDROCORTISONE 1 % EX CREA
TOPICAL_CREAM | Freq: Three times a day (TID) | CUTANEOUS | Status: DC
  Administered 2014-04-19: 02:00:00 via TOPICAL
  Filled 2014-04-19: qty 28

## 2014-04-19 MED ORDER — HYDROCODONE-ACETAMINOPHEN 5-325 MG PO TABS: 1.0000 | ORAL_TABLET | ORAL | Status: DC | PRN

## 2014-04-19 MED ORDER — IOHEXOL 350 MG/ML SOLN
100.0000 mL | Freq: Once | INTRAVENOUS | Status: AC | PRN
  Administered 2014-04-19: 100 mL via INTRAVENOUS
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
## 2014-04-19 NOTE — Discharge Instructions (Signed)
Continue to use your immobilizer and crutches.  Continue taking your Xarelto.  Follow up with your doctors as scheduled.   Deep Vein Thrombosis A deep vein thrombosis (DVT) is a blood clot that develops in the deep, larger veins of the leg, arm, or pelvis. These are more dangerous than clots that might form in veins near the surface of the body. A DVT can lead to complications if the clot breaks off and travels in the bloodstream to the lungs.  A DVT can damage the valves in your leg veins, so that instead of flowing upward, the blood pools in the lower leg. This is called post-thrombotic syndrome, and it can result in pain, swelling, discoloration, and sores on the leg. CAUSES Usually, several things contribute to blood clots forming. Contributing factors include:  The flow of blood slows down.  The inside of the vein is damaged in some way.  You have a condition that makes blood clot more easily. RISK FACTORS Some people are more likely than others to develop blood clots. Risk factors include:   Older age, especially over 46 years of age.  Having a family history of blood clots or if you have already had a blot clot.  Having major or lengthy surgery. This is especially true for surgery on the hip, knee, or belly (abdomen). Hip surgery is particularly high risk.  Breaking a hip or leg.  Sitting or lying still for a long time. This includes long-distance travel, paralysis, or recovery from an illness or surgery.  Having cancer or cancer treatment.  Having a long, thin tube (catheter) placed inside a vein during a medical procedure.  Being overweight (obese).  Pregnancy and childbirth.  Hormone changes make the blood clot more easily during pregnancy.  The fetus puts pressure on the veins of the pelvis.  There is a risk of injury to veins during delivery or a caesarean. The risk is highest just after childbirth.  Medicines with the male hormone estrogen. This includes birth  control pills and hormone replacement therapy.  Smoking.  Other circulation or heart problems.  SIGNS AND SYMPTOMS When a clot forms, it can either partially or totally block the blood flow in that vein. Symptoms of a DVT can include:  Swelling of the leg or arm, especially if one side is much worse.  Warmth and redness of the leg or arm, especially if one side is much worse.  Pain in an arm or leg. If the clot is in the leg, symptoms may be more noticeable or worse when standing or walking. The symptoms of a DVT that has traveled to the lungs (pulmonary embolism, PE) usually start suddenly and include:  Shortness of breath.  Coughing.  Coughing up blood or blood-tinged phlegm.  Chest pain. The chest pain is often worse with deep breaths.  Rapid heartbeat. Anyone with these symptoms should get emergency medical treatment right away. Call your local emergency services (911 in the [place] if you have these symptoms. DIAGNOSIS If a DVT is suspected, your health care provider will take a full medical history and perform a physical exam. Tests that also may be required include:  Blood tests, including studies of the clotting properties of the blood.  Ultrasonography to see if you have clots in your legs or lungs.  X-rays to show the flow of blood when dye is injected into the veins (venography).  Studies of your lungs if you have any chest symptoms. PREVENTION  Exercise the legs regularly. Take a  brisk 30-minute walk every day.  Maintain a weight that is appropriate for your height.  Avoid sitting or lying in bed for long periods of time without moving your legs.  Women, particularly those over the age of 76 years, should consider the risks and benefits of taking estrogen medicines, including birth control pills.  Do not smoke, especially if you take estrogen medicines.  Long-distance travel can increase your risk of DVT. You should exercise your legs by walking or pumping  the muscles every hour.  In-hospital prevention:  Many of the risk factors above relate to situations that exist with hospitalization, either for illness, injury, or elective surgery.  Your health care provider will assess you for the need for venous thromboembolism prophylaxis when you are admitted to the hospital. If you are having surgery, your surgeon will assess you the day of or day after surgery.  Prevention may include medical and nonmedical measures. TREATMENT Once identified, a DVT can be treated. It can also be prevented in some circumstances. Once you have had a DVT, you may be at increased risk for a DVT in the future. The most common treatment for DVT is blood thinning (anticoagulant) medicine, which reduces the blood's tendency to clot. Anticoagulants can stop new blood clots from forming and stop old ones from growing. They cannot dissolve existing clots. Your body does this by itself over time. Anticoagulants can be given by mouth, by IV access, or by injection. Your health care provider will determine the best program for you. Other medicines or treatments that may be used are:  Heparin or related medicines (low molecular weight heparin) are usually the first treatment for a blood clot. They act quickly. However, they cannot be taken orally.  Heparin can cause a fall in a component of blood that stops bleeding and forms blood clots (platelets). You will be monitored with blood tests to be sure this does not occur.  Warfarin is an anticoagulant that can be swallowed. It takes a few days to start working, so usually heparin or related medicines are used in combination. Once warfarin is working, heparin is usually stopped.  Less commonly, clot dissolving drugs (thrombolytics) are used to dissolve a DVT. They carry a high risk of bleeding, so they are used mainly in severe cases, where your life or a limb is threatened.  Very rarely, a blood clot in the leg needs to be removed  surgically.  If you are unable to take anticoagulants, your health care provider may arrange for you to have a filter placed in a main vein in your abdomen. This filter prevents clots from traveling to your lungs. HOME CARE INSTRUCTIONS  Take all medicines prescribed by your health care provider. Only take over-the-counter or prescription medicines for pain, fever, or discomfort as directed by your health care provider.  Warfarin. Most people will continue taking warfarin after hospital discharge. Your health care provider will advise you on the length of treatment (usually 3-6 months, sometimes lifelong).  Too much and too little warfarin are both dangerous. Too much warfarin increases the risk of bleeding. Too little warfarin continues to allow the risk for blood clots. While taking warfarin, you will need to have regular blood tests to measure your blood clotting time. These blood tests usually include both the prothrombin time (PT) and international normalized ratio (INR) tests. The PT and INR results allow your health care provider to adjust your dose of warfarin. The dose can change for many reasons. It is critically  important that you take warfarin exactly as prescribed, and that you have your PT and INR levels drawn exactly as directed.  Many foods, especially foods high in vitamin K, can interfere with warfarin and affect the PT and INR results. Foods high in vitamin K include spinach, kale, broccoli, cabbage, collard and turnip greens, brussel sprouts, peas, cauliflower, seaweed, and parsley as well as beef and pork liver, green tea, and soybean oil. You should eat a consistent amount of foods high in vitamin K. Avoid major changes in your diet, or notify your health care provider before changing your diet. Arrange a visit with a dietitian to answer your questions.  Many medicines can interfere with warfarin and affect the PT and INR results. You must tell your health care provider about any  and all medicines you take. This includes all vitamins and supplements. Be especially cautious with aspirin and anti-inflammatory medicines. Ask your health care provider before taking these. Do not take or discontinue any prescribed or over-the-counter medicine except on the advice of your health care provider or pharmacist.  Warfarin can have side effects, primarily excessive bruising or bleeding. You will need to hold pressure over cuts for longer than usual. Your health care provider or pharmacist will discuss other potential side effects.  Alcohol can change the body's ability to handle warfarin. It is best to avoid alcoholic drinks or consume only very small amounts while taking warfarin. Notify your health care provider if you change your alcohol intake.  Notify your dentist or other health care providers before procedures.  Activity. Ask your health care provider how soon you can go back to normal activities. It is important to stay active to prevent blood clots. If you are on anticoagulant medicine, avoid contact sports.  Exercise. It is very important to exercise. This is especially important while traveling, sitting, or standing for long periods of time. Exercise your legs by walking or by pumping the muscles frequently. Take frequent walks.  Compression stockings. These are tight elastic stockings that apply pressure to the lower legs. This pressure can help keep the blood in the legs from clotting. You may need to wear compression stockings at home to help prevent a DVT.  Do not smoke. If you smoke, quit. Ask your health care provider for help with quitting smoking.  Learn as much as you can about DVT. Knowing more about the condition should help you keep it from coming back.  Wear a medical alert bracelet or carry a medical alert card. SEEK MEDICAL CARE IF:  You notice a rapid heartbeat.  You feel weaker or more tired than usual.  You feel faint.  You notice increased  bruising.  You feel your symptoms are not getting better in the time expected.  You believe you are having side effects of medicine. SEEK IMMEDIATE MEDICAL CARE IF:  You have chest pain.  You have trouble breathing.  You have new or increased swelling or pain in one leg.  You cough up blood.  You notice blood in vomit, in a bowel movement, or in urine. MAKE SURE YOU:  Understand these instructions.  Will watch your condition.  Will get help right away if you are not doing well or get worse. Document Released: 10/13/2005 Document Revised: 08/03/2013 Document Reviewed: 06/20/2013 [facility] Patient Information 2015 [place], [place]. This information is not intended to replace advice given to you by your health care provider. Make sure you discuss any questions you have with your health care provider.  Shortness of Breath Shortness of breath means you have trouble breathing. It could also mean that you have a medical problem. You should get immediate medical care for shortness of breath. CAUSES   Not enough oxygen in the air such as with high altitudes or a smoke-filled room.  Certain lung diseases, infections, or problems.  Heart disease or conditions, such as angina or heart failure.  Low red blood cells (anemia).  Poor physical fitness, which can cause shortness of breath when you exercise.  Chest or back injuries or stiffness.  Being overweight.  Smoking.  Anxiety, which can make you feel like you are not getting enough air. DIAGNOSIS  Serious medical problems can often be found during your physical exam. Tests may also be done to determine why you are having shortness of breath. Tests may include:  Chest X-rays.  Lung function tests.  Blood tests.  An electrocardiogram (ECG).  An ambulatory electrocardiogram. An ambulatory ECG records your heartbeat patterns over a 24-hour period.  Exercise testing.  A transthoracic echocardiogram (TTE). During  echocardiography, sound waves are used to evaluate how blood flows through your heart.  A transesophageal echocardiogram (TEE).  Imaging scans. Your health care provider may not be able to find a cause for your shortness of breath after your exam. In this case, it is important to have a follow-up exam with your health care provider as directed.  TREATMENT  Treatment for shortness of breath depends on the cause of your symptoms and can vary greatly. HOME CARE INSTRUCTIONS   Do not smoke. Smoking is a common cause of shortness of breath. If you smoke, ask for help to quit.  Avoid being around chemicals or things that may bother your breathing, such as paint fumes and dust.  Rest as needed. Slowly resume your usual activities.  If medicines were prescribed, take them as directed for the full length of time directed. This includes oxygen and any inhaled medicines.  Keep all follow-up appointments as directed by your health care provider. SEEK MEDICAL CARE IF:   Your condition does not improve in the time expected.  You have a hard time doing your normal activities even with rest.  You have any new symptoms. SEEK IMMEDIATE MEDICAL CARE IF:   Your shortness of breath gets worse.  You feel light-headed, faint, or develop a cough not controlled with medicines.  You start coughing up blood.  You have pain with breathing.  You have chest pain or pain in your arms, shoulders, or abdomen.  You have a fever.  You are unable to walk up stairs or exercise the way you normally do. MAKE SURE YOU:  Understand these instructions.  Will watch your condition.  Will get help right away if you are not doing well or get worse. Document Released: 07/08/2001 Document Revised: 10/18/2013 Document Reviewed: 12/29/2011 [facility] Patient Information 2015 [place], [place]. This information is not intended to replace advice given to you by your health care provider. Make sure you discuss any questions  you have with your health care provider.

## 2014-04-19 NOTE — ED Notes (Signed)
Patient is alert and oriented x3.  He was given DC instructions and follow up visit instructions.  Patient gave verbal understanding.  He was DC ambulatory under his own power to home.  V/S stable.  He was not showing any signs of distress on DC 

## 2014-04-20 ENCOUNTER — Telehealth: Payer: Self-pay | Admitting: Internal Medicine

## 2014-04-20 NOTE — Telephone Encounter (Signed)
Pt says that he is confused about what to do regarding his leg. He was told by one physician that he needs to remove his leg brace in order to get ready for surgery then was told to keep brace on and leg elevated due to blood clot. Please f/u with pt w/ instructions.

## 2014-04-21 ENCOUNTER — Telehealth: Payer: Self-pay | Admitting: Emergency Medicine

## 2014-04-21 NOTE — Telephone Encounter (Signed)
Spoke with pt regarding instructions to wear immobilizer with blood clot. Pt went to ER 04/18/14 diag with DVT and told to continue Xeralto with immobilizer/crutches. States he didn't know if he should continue wearing brace. Pt has a fracture as well. Informed him to follow ER instructions until by Fatima Fedie. Scheduled f/u appt with Dr. Annitta Needs 05/15/14

## 2014-04-23 ENCOUNTER — Encounter (HOSPITAL_COMMUNITY): Payer: Self-pay | Admitting: Emergency Medicine

## 2014-04-23 ENCOUNTER — Emergency Department (HOSPITAL_COMMUNITY)
Admission: EM | Admit: 2014-04-23 | Discharge: 2014-04-24 | Disposition: A | Discharge status: Home / Self Care | Payer: No Typology Code available for payment source | Attending: Emergency Medicine | Admitting: Emergency Medicine

## 2014-04-23 DIAGNOSIS — Z86718 Personal history of other venous thrombosis and embolism: Secondary | ICD-10-CM | POA: Insufficient documentation

## 2014-04-23 DIAGNOSIS — R109 Unspecified abdominal pain: Secondary | ICD-10-CM | POA: Insufficient documentation

## 2014-04-23 DIAGNOSIS — IMO0002 Reserved for concepts with insufficient information to code with codable children: Secondary | ICD-10-CM | POA: Insufficient documentation

## 2014-04-23 DIAGNOSIS — K625 Hemorrhage of anus and rectum: Secondary | ICD-10-CM | POA: Insufficient documentation

## 2014-04-23 DIAGNOSIS — Z8669 Personal history of other diseases of the nervous system and sense organs: Secondary | ICD-10-CM | POA: Insufficient documentation

## 2014-04-23 DIAGNOSIS — F17200 Nicotine dependence, unspecified, uncomplicated: Secondary | ICD-10-CM | POA: Insufficient documentation

## 2014-04-23 DIAGNOSIS — R1032 Left lower quadrant pain: Secondary | ICD-10-CM | POA: Insufficient documentation

## 2014-04-23 DIAGNOSIS — J45909 Unspecified asthma, uncomplicated: Secondary | ICD-10-CM | POA: Insufficient documentation

## 2014-04-23 DIAGNOSIS — Z79899 Other long term (current) drug therapy: Secondary | ICD-10-CM | POA: Insufficient documentation

## 2014-04-23 NOTE — ED Notes (Signed)
Pt states that his stomach has been feeling bloated for a couple days  Pt states tonight he went to the bathroom to have a BM and states blood ran out of him like water  Pt states it was dark in color  Pt states he continues to feel bloated

## 2014-04-24 ENCOUNTER — Telehealth: Payer: Self-pay

## 2014-04-24 DIAGNOSIS — K625 Hemorrhage of anus and rectum: Secondary | ICD-10-CM

## 2014-04-24 LAB — CBC WITH DIFFERENTIAL/PLATELET
BASOS ABS: 0 10*3/uL (ref 0.0–0.1)
Basophils Relative: 0 % (ref 0–1)
EOS ABS: 0.3 10*3/uL (ref 0.0–0.7)
Eosinophils Relative: 3 % (ref 0–5)
HEMATOCRIT: 41.7 % (ref 39.0–52.0)
Hemoglobin: 13.9 g/dL (ref 13.0–17.0)
Lymphocytes Relative: 33 % (ref 12–46)
Lymphs Abs: 3.1 10*3/uL (ref 0.7–4.0)
MCH: 29.3 pg (ref 26.0–34.0)
MCHC: 33.3 g/dL (ref 30.0–36.0)
MCV: 88 fL (ref 78.0–100.0)
MONOS PCT: 8 % (ref 3–12)
Monocytes Absolute: 0.7 10*3/uL (ref 0.1–1.0)
NEUTROS ABS: 5.2 10*3/uL (ref 1.7–7.7)
Neutrophils Relative %: 56 % (ref 43–77)
Platelets: 285 10*3/uL (ref 150–400)
RBC: 4.74 MIL/uL (ref 4.22–5.81)
RDW: 13 % (ref 11.5–15.5)
WBC: 9.3 10*3/uL (ref 4.0–10.5)

## 2014-04-24 LAB — POC OCCULT BLOOD, ED: FECAL OCCULT BLD: POSITIVE — AB

## 2014-04-24 LAB — BASIC METABOLIC PANEL
BUN: 13 mg/dL (ref 6–23)
CO2: 27 mEq/L (ref 19–32)
Calcium: 9.6 mg/dL (ref 8.4–10.5)
Chloride: 101 mEq/L (ref 96–112)
Creatinine, Ser: 0.99 mg/dL (ref 0.50–1.35)
Glucose, Bld: 102 mg/dL — ABNORMAL HIGH (ref 70–99)
POTASSIUM: 4.2 meq/L (ref 3.7–5.3)
Sodium: 140 mEq/L (ref 137–147)

## 2014-04-24 NOTE — ED Notes (Signed)
Pt is on xarelto for blood clots ,  He says he feels weak and his stomach is bloated and he has a rash and itches everytime he takes xarelto

## 2014-04-24 NOTE — ED Notes (Signed)
Peter PA at bedside

## 2014-04-24 NOTE — ED Provider Notes (Signed)
Medical screening examination/treatment/procedure(s) were performed by non-physician practitioner and as supervising physician I was immediately available for consultation/collaboration.   EKG Interpretation None       Kalman Drape, MD 04/24/14 [phone]

## 2014-04-24 NOTE — Telephone Encounter (Signed)
Returned patient phone call Patient states was in the ED yesterday for rectal bleeding Patient states he will not continue taking xarelto Patient stated he has done some research and is scared  To be taking this medication Risks were explained to patient Spoke with Dr Annitta Needs -will put a referral in epic for GI dr

## 2014-04-24 NOTE — Discharge Instructions (Signed)
Please follow up with your doctor today for continued evaluation and treatment. Return if you have any other episodes of rectal bleeding or worsening symptoms.    Gastrointestinal Bleeding Gastrointestinal (GI) bleeding means there is bleeding somewhere along the digestive tract, between the mouth and anus. CAUSES  There are many different problems that can cause GI bleeding. Possible causes include:  Esophagitis. This is inflammation, irritation, or swelling of the esophagus.  Hemorrhoids.These are veins that are full of blood (engorged) in the rectum. They cause pain, inflammation, and may bleed.  Anal fissures.These are areas of painful tearing which may bleed. They are often caused by passing hard stool.  Diverticulosis.These are pouches that form on the colon over time, with age, and may bleed significantly.  Diverticulitis.This is inflammation in areas with diverticulosis. It can cause pain, fever, and bloody stools, although bleeding is rare.  Polyps and cancer. Colon cancer often starts out as precancerous polyps.  Gastritis and ulcers.Bleeding from the upper gastrointestinal tract (near the stomach) may travel through the intestines and produce black, sometimes tarry, often bad smelling stools. In certain cases, if the bleeding is fast enough, the stools may not be black, but red. This condition may be life-threatening. SYMPTOMS   Vomiting bright red blood or material that looks like coffee grounds.  Bloody, black, or tarry stools. DIAGNOSIS  Your caregiver may diagnose your condition by taking your history and performing a physical exam. More tests may be needed, including:  X-rays and other imaging tests.  Esophagogastroduodenoscopy (EGD). This test uses a flexible, lighted tube to look at your esophagus, stomach, and small intestine.  Colonoscopy. This test uses a flexible, lighted tube to look at your colon. TREATMENT  Treatment depends on the cause of your  bleeding.   For bleeding from the esophagus, stomach, small intestine, or colon, the caregiver doing your EGD or colonoscopy may be able to stop the bleeding as part of the procedure.  Inflammation or infection of the colon can be treated with medicines.  Many rectal problems can be treated with creams, suppositories, or warm baths.  Surgery is sometimes needed.  Blood transfusions are sometimes needed if you have lost a lot of blood. If bleeding is slow, you may be allowed to go home. If there is a lot of bleeding, you will need to stay in the hospital for observation. HOME CARE INSTRUCTIONS   Take any medicines exactly as prescribed.  Keep your stools soft by eating foods that are high in fiber. These foods include whole grains, legumes, fruits, and vegetables. Prunes (1 to 3 a day) work well for many people.  Drink enough fluids to keep your urine clear or pale yellow. SEEK IMMEDIATE MEDICAL CARE IF:   Your bleeding increases.  You feel lightheaded, weak, or you faint.  You have severe cramps in your back or abdomen.  You pass large blood clots in your stool.  Your problems are getting worse. MAKE SURE YOU:   Understand these instructions.  Will watch your condition.  Will get help right away if you are not doing well or get worse. Document Released: 10/10/2000 Document Revised: 09/29/2012 Document Reviewed: 09/22/2011 [facility] Patient Information 2015 [place], [place]. This information is not intended to replace advice given to you by your health care provider. Make sure you discuss any questions you have with your health care provider.

## 2014-04-24 NOTE — ED Notes (Signed)
Water given to pt 
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
## 2014-04-24 NOTE — ED Notes (Signed)
Pt states he was at [employer] with his girlfriend whom just had a baby and he had a bad stomach ache and he went to bathroom and it was total bloody,  Pt is alert and oriented has no complaints of dizziness, only abdominal pain 8/10

## 2014-04-26 ENCOUNTER — Other Ambulatory Visit: Payer: Self-pay | Admitting: Internal Medicine

## 2014-04-26 DIAGNOSIS — I82402 Acute embolism and thrombosis of unspecified deep veins of left lower extremity: Secondary | ICD-10-CM

## 2014-04-26 MED ORDER — RIVAROXABAN 20 MG PO TABS: 20.0000 mg | ORAL_TABLET | Freq: Every day | ORAL | Status: DC

## 2014-05-01 ENCOUNTER — Encounter: Payer: Self-pay | Admitting: Gastroenterology

## 2014-05-01 NOTE — ED Notes (Signed)
Pt. called [facility] asking for a refill of his Xarelto.  I explained to pt., we do not do refills of medication and he is being followed by the [facility] and WC. They can do the refill for him.  He did not believe me and insisted Dr. Georgina Snell prescribed it for him.  I accessed his record and explained the he is being followed for his DVT by the clinic and gave him the name of the doctor and the phone number. Roselyn Meier 05/01/2014

## 2014-05-02 ENCOUNTER — Encounter: Payer: Self-pay | Admitting: Internal Medicine

## 2014-05-02 ENCOUNTER — Ambulatory Visit: Payer: Self-pay | Attending: Internal Medicine | Admitting: Internal Medicine

## 2014-05-02 VITALS — BP 135/87 | HR 84 | Temp 97.8°F | Resp 16 | Wt 221.6 lb

## 2014-05-02 DIAGNOSIS — I82409 Acute embolism and thrombosis of unspecified deep veins of unspecified lower extremity: Secondary | ICD-10-CM | POA: Insufficient documentation

## 2014-05-02 DIAGNOSIS — E559 Vitamin D deficiency, unspecified: Secondary | ICD-10-CM | POA: Insufficient documentation

## 2014-05-02 DIAGNOSIS — I82402 Acute embolism and thrombosis of unspecified deep veins of left lower extremity: Secondary | ICD-10-CM

## 2014-05-02 DIAGNOSIS — F17200 Nicotine dependence, unspecified, uncomplicated: Secondary | ICD-10-CM | POA: Insufficient documentation

## 2014-05-02 DIAGNOSIS — R21 Rash and other nonspecific skin eruption: Secondary | ICD-10-CM | POA: Insufficient documentation

## 2014-05-02 DIAGNOSIS — K625 Hemorrhage of anus and rectum: Secondary | ICD-10-CM | POA: Insufficient documentation

## 2014-05-02 LAB — CBC WITH DIFFERENTIAL/PLATELET
BASOS PCT: 0 % (ref 0–1)
Basophils Absolute: 0 10*3/uL (ref 0.0–0.1)
Eosinophils Absolute: 0.3 10*3/uL (ref 0.0–0.7)
Eosinophils Relative: 3 % (ref 0–5)
HCT: 43.2 % (ref 39.0–52.0)
Hemoglobin: 15.3 g/dL (ref 13.0–17.0)
Lymphocytes Relative: 27 % (ref 12–46)
Lymphs Abs: 2.5 10*3/uL (ref 0.7–4.0)
MCH: 30.3 pg (ref 26.0–34.0)
MCHC: 35.4 g/dL (ref 30.0–36.0)
MCV: 85.5 fL (ref 78.0–100.0)
Monocytes Absolute: 0.8 10*3/uL (ref 0.1–1.0)
Monocytes Relative: 9 % (ref 3–12)
NEUTROS ABS: 5.7 10*3/uL (ref 1.7–7.7)
NEUTROS PCT: 61 % (ref 43–77)
PLATELETS: 348 10*3/uL (ref 150–400)
RBC: 5.05 MIL/uL (ref 4.22–5.81)
RDW: 14 % (ref 11.5–15.5)
WBC: 9.3 10*3/uL (ref 4.0–10.5)

## 2014-05-02 MED ORDER — VITAMIN D (ERGOCALCIFEROL) 1.25 MG (50000 UNIT) PO CAPS: 50000.0000 [IU] | ORAL_CAPSULE | ORAL | Status: DC

## 2014-05-02 MED ORDER — HYDROCORTISONE 1 % EX CREA: 1.0000 "application " | TOPICAL_CREAM | CUTANEOUS | Status: DC | PRN

## 2014-05-02 NOTE — Telephone Encounter (Signed)
Patient was seen today Stated he saw the orthopedic yesterday And he removed the leg brace Patient stated he will be having surgery to  His left knee
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
## 2014-05-02 NOTE — Progress Notes (Signed)
MRN: [id number] Name: Douglas Sloan  Sex: male Age: 33 y.o. DOB: 24-Apr-1981  Allergies: No known allergies  Chief Complaint  Patient presents with  . Follow-up    HPI: Patient is 33 y.o. male who has to of left leg DVT comes today for followup, recently patient went to the emergency room with rectal bleeding, EMR reviewed his CBC was stable, as per patient he was initially concerned to take a Xarelto but as per patient he is taking his Xarelto regularly and denies any more bleeding patient has already been referred to GI, patient is also following up with her orthopedics has left  knee anterior cruciate ligament and meniscal tear, as per patient his orthopedics is considering to do a surgery, he had a blood work done which was reviewed with the patient noticed vitamin D deficiency, as per patient he only took 4 pills. Patient is also requesting refill on hydrocortisone cream which she applies for itchy rash.  Past Medical History  Diagnosis Date  . Asthma   . Schizophrenia   . DVT (deep venous thrombosis)     Past Surgical History  Procedure Laterality Date  . Facial cosmetic surgery      reconstructive  . Appendectomy        Medication List       This list is accurate as of: 05/02/14 11:08 AM.  Always use your most recent med list.               gabapentin 300 MG capsule  Commonly known as:  NEURONTIN  Take 300 mg by mouth 2 (two) times daily.     hydrocortisone cream 1 %  Apply 1 application topically as needed for itching.     rivaroxaban 20 MG Tabs tablet  Commonly known as:  XARELTO  Take 1 tablet (20 mg total) by mouth daily with supper.     Vitamin D (Ergocalciferol) 50000 UNITS Caps capsule  Commonly known as:  DRISDOL  Take 1 capsule (50,000 Units total) by mouth every 7 (seven) days.        Meds ordered this encounter  Medications  . hydrocortisone cream 1 %    Sig: Apply 1 application topically as needed for itching.    Dispense:  30 g   Refill:  1  . Vitamin D, Ergocalciferol, (DRISDOL) 50000 UNITS CAPS capsule    Sig: Take 1 capsule (50,000 Units total) by mouth every 7 (seven) days.    Dispense:  12 capsule    Refill:  0     There is no immunization history on file for this patient.  Family History  Problem Relation Age of Onset  . Diabetes Other   . Heart failure Other   . Diabetes Paternal Uncle     History  Substance Use Topics  . Smoking status: Current Every Day Smoker -- 0.25 packs/day for 10 years    Types: Cigarettes  . Smokeless tobacco: Not on file  . Alcohol Use: No    Review of Systems   As noted in HPI  Filed Vitals:   05/02/14 1036  BP: 135/87  Pulse: 84  Temp: 97.8 F (36.6 C)  Resp: 16    Physical Exam  Physical Exam  Constitutional: No distress.  Eyes: EOM are normal. Pupils are equal, round, and reactive to light.  Cardiovascular: Normal rate and regular rhythm.   Pulmonary/Chest: Breath sounds normal. No respiratory distress. He has no wheezes. He has no rales.  Musculoskeletal:  +  tenderness of left calf    CBC    Component Value Date/Time   WBC 9.3 04/24/2014 0020   RBC 4.74 04/24/2014 0020   HGB 13.9 04/24/2014 0020   HCT 41.7 04/24/2014 0020   PLT 285 04/24/2014 0020   MCV 88.0 04/24/2014 0020   LYMPHSABS 3.1 04/24/2014 0020   MONOABS 0.7 04/24/2014 0020   EOSABS 0.3 04/24/2014 0020   BASOSABS 0.0 04/24/2014 0020    CMP     Component Value Date/Time   NA 140 04/24/2014 0020   K 4.2 04/24/2014 0020   CL 101 04/24/2014 0020   CO2 27 04/24/2014 0020   GLUCOSE 102* 04/24/2014 0020   BUN 13 04/24/2014 0020   CREATININE 0.99 04/24/2014 0020   CREATININE 0.93 04/03/2014 1101   CALCIUM 9.6 04/24/2014 0020   PROT 6.7 04/03/2014 1101   ALBUMIN 4.0 04/03/2014 1101   AST 31 04/03/2014 1101   ALT 56* 04/03/2014 1101   ALKPHOS 102 04/03/2014 1101   BILITOT 0.5 04/03/2014 1101   GFRNONAA >90 04/24/2014 0020   GFRNONAA >89 04/03/2014 1101   GFRAA >90 04/24/2014 0020   GFRAA >89 04/03/2014 1101     Lab Results  Component Value Date/Time   CHOL 129 04/03/2014 11:01 AM    No components found with this basename: [id number]    Lab Results  Component Value Date/Time   AST 31 04/03/2014 11:01 AM    Assessment and Plan  Left leg DVT Currently on Xarelto patient will start taking 20 mg from tomorrow.  Smoking Again counseled patient to quit smoking.  Unspecified vitamin D deficiency - Plan: Vitamin D, Ergocalciferol, (DRISDOL) 50000 UNITS CAPS capsule  Rash and nonspecific skin eruption - Plan: hydrocortisone cream 1 %  Rectal bleeding - Plan: Patient currently denies any more bleeding, Will repeat CBC with Differential    Return in about 3 months (around 08/02/2014).  Lorayne Marek, MD

## 2014-05-03 ENCOUNTER — Telehealth: Payer: Self-pay

## 2014-05-03 DIAGNOSIS — E559 Vitamin D deficiency, unspecified: Secondary | ICD-10-CM

## 2014-05-03 MED ORDER — VITAMIN D (ERGOCALCIFEROL) 1.25 MG (50000 UNIT) PO CAPS: 50000.0000 [IU] | ORAL_CAPSULE | ORAL | Status: DC

## 2014-05-03 NOTE — Telephone Encounter (Signed)
Patient is aware of his lab results [facility] 

## 2014-05-03 NOTE — Telephone Encounter (Signed)
Message copied by Dorothe Pea on Wed May 03, 2014 12:09 PM ------      Message from: Lorayne Marek      Created: Wed May 03, 2014  9:21 AM       Call and let the Patient know that blood work is normal.       ------

## 2014-05-03 NOTE — Telephone Encounter (Signed)
Patient not available Left message on voice mail that his lab results' Were all normal

## 2014-05-15 ENCOUNTER — Ambulatory Visit: Payer: Self-pay | Admitting: Internal Medicine

## 2014-05-15 ENCOUNTER — Telehealth: Payer: Self-pay | Admitting: Internal Medicine

## 2014-05-15 DIAGNOSIS — O2230 Deep phlebothrombosis in pregnancy, unspecified trimester: Secondary | ICD-10-CM

## 2014-05-15 NOTE — Telephone Encounter (Signed)
PT.  Was informed that he needed to wait 6 weeks for clot to disappear, patient would like to know if he needs to schedule another appointment of if he would receive a call to schedule an appointment, pt. Is a little bit confused and would like clarification... Please call patient

## 2014-05-17 ENCOUNTER — Other Ambulatory Visit: Payer: Self-pay

## 2014-05-17 DIAGNOSIS — I82402 Acute embolism and thrombosis of unspecified deep veins of left lower extremity: Secondary | ICD-10-CM

## 2014-05-17 NOTE — Telephone Encounter (Signed)
Patient having surgery to his left knee and surgeon is requesting That he have a doppler to  Make sure hid DVT has subsided Order placed in Epic appt is scheduled for 7/23 @ 2pm and patient is aware

## 2014-05-18 ENCOUNTER — Ambulatory Visit (HOSPITAL_COMMUNITY)
Admission: RE | Admit: 2014-05-18 | Discharge: 2014-05-18 | Disposition: A | Discharge status: Home / Self Care | Payer: Self-pay | Source: Ambulatory Visit | Attending: Internal Medicine | Admitting: Internal Medicine

## 2014-05-18 DIAGNOSIS — I82409 Acute embolism and thrombosis of unspecified deep veins of unspecified lower extremity: Secondary | ICD-10-CM | POA: Insufficient documentation

## 2014-05-18 DIAGNOSIS — I82402 Acute embolism and thrombosis of unspecified deep veins of left lower extremity: Secondary | ICD-10-CM

## 2014-05-18 DIAGNOSIS — O2230 Deep phlebothrombosis in pregnancy, unspecified trimester: Secondary | ICD-10-CM

## 2014-05-18 NOTE — Progress Notes (Signed)
VASCULAR LAB PRELIMINARY  PRELIMINARY  PRELIMINARY  PRELIMINARY  Bilateral lower extremity venous duplex completed.    Preliminary report:  Bilateral:  No evidence of DVT, superficial thrombosis, or Baker's Cyst.   Aisley Whan, RVS 05/18/2014, 4:24 PM

## 2014-06-08 ENCOUNTER — Telehealth: Payer: Self-pay | Admitting: Gastroenterology

## 2014-06-08 ENCOUNTER — Encounter: Payer: Self-pay | Admitting: Gastroenterology

## 2014-06-08 NOTE — Telephone Encounter (Signed)
REVIEWED.  

## 2014-06-08 NOTE — Telephone Encounter (Signed)
Pt was a no show

## 2014-06-08 NOTE — Progress Notes (Signed)
   Subjective:    Patient ID: Douglas Sloan, male    DOB: 1981/01/17, 33 y.o.   MRN: [id number]  HPI   Past Medical History  Diagnosis Date  . Asthma   . Schizophrenia   . DVT (deep venous thrombosis)     Review of Systems     Objective:   Physical Exam        Assessment & Plan:

## 2014-06-18 ENCOUNTER — Encounter (HOSPITAL_COMMUNITY): Payer: Self-pay | Admitting: Emergency Medicine

## 2014-06-18 ENCOUNTER — Emergency Department (HOSPITAL_COMMUNITY): Payer: No Typology Code available for payment source

## 2014-06-18 ENCOUNTER — Emergency Department (HOSPITAL_COMMUNITY)
Admission: EM | Admit: 2014-06-18 | Discharge: 2014-06-18 | Disposition: A | Discharge status: Home / Self Care | Payer: No Typology Code available for payment source | Attending: Emergency Medicine | Admitting: Emergency Medicine

## 2014-06-18 DIAGNOSIS — Z79899 Other long term (current) drug therapy: Secondary | ICD-10-CM | POA: Insufficient documentation

## 2014-06-18 DIAGNOSIS — J45909 Unspecified asthma, uncomplicated: Secondary | ICD-10-CM | POA: Insufficient documentation

## 2014-06-18 DIAGNOSIS — Z7901 Long term (current) use of anticoagulants: Secondary | ICD-10-CM | POA: Insufficient documentation

## 2014-06-18 DIAGNOSIS — M25562 Pain in left knee: Secondary | ICD-10-CM

## 2014-06-18 DIAGNOSIS — Y9389 Activity, other specified: Secondary | ICD-10-CM | POA: Insufficient documentation

## 2014-06-18 DIAGNOSIS — IMO0002 Reserved for concepts with insufficient information to code with codable children: Secondary | ICD-10-CM | POA: Insufficient documentation

## 2014-06-18 DIAGNOSIS — F17200 Nicotine dependence, unspecified, uncomplicated: Secondary | ICD-10-CM | POA: Insufficient documentation

## 2014-06-18 DIAGNOSIS — Z8659 Personal history of other mental and behavioral disorders: Secondary | ICD-10-CM | POA: Insufficient documentation

## 2014-06-18 DIAGNOSIS — Z86718 Personal history of other venous thrombosis and embolism: Secondary | ICD-10-CM | POA: Insufficient documentation

## 2014-06-18 DIAGNOSIS — Y92410 Unspecified street and highway as the place of occurrence of the external cause: Secondary | ICD-10-CM | POA: Insufficient documentation

## 2014-06-18 DIAGNOSIS — F319 Bipolar disorder, unspecified: Secondary | ICD-10-CM | POA: Insufficient documentation

## 2014-06-18 HISTORY — DX: Bipolar disorder, unspecified: F31.9

## 2014-06-18 MED ORDER — OXYCODONE-ACETAMINOPHEN 5-325 MG PO TABS
1.0000 | ORAL_TABLET | Freq: Once | ORAL | Status: AC
  Administered 2014-06-18: 1 via ORAL
  Filled 2014-06-18: qty 1

## 2014-06-18 MED ORDER — NAPROXEN 375 MG PO TABS: 375.0000 mg | ORAL_TABLET | Freq: Two times a day (BID) | ORAL | Status: DC

## 2014-06-18 MED ORDER — IBUPROFEN 400 MG PO TABS
400.0000 mg | ORAL_TABLET | Freq: Once | ORAL | Status: AC
  Administered 2014-06-18: 400 mg via ORAL
  Filled 2014-06-18: qty 1

## 2014-06-18 NOTE — ED Notes (Signed)
Patient returned from X-ray 
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
## 2014-06-18 NOTE — ED Notes (Signed)
Family present in w/r. [employer] x2 at [facility]. Pt alert, NAD, calm, cooperative.

## 2014-06-18 NOTE — ED Notes (Signed)
Patient transported to X-ray 
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
## 2014-06-18 NOTE — Discharge Instructions (Signed)
If you were given medicines take as directed.  If you are on coumadin or contraceptives realize their levels and effectiveness is altered by many different medicines.  If you have any reaction (rash, tongues swelling, other) to the medicines stop taking and see a physician.   Please follow up as directed and return to the ER or see a physician for new or worsening symptoms.  Thank you. Filed Vitals:   06/18/14 0234 06/18/14 0300 06/18/14 0340 06/18/14 0345  BP: 130/83 124/67 135/73   Pulse: 88 79  78  Temp:      TempSrc:      Resp: 20 18  17   SpO2: 100% 98%  99%

## 2014-07-07 ENCOUNTER — Other Ambulatory Visit (HOSPITAL_COMMUNITY): Payer: Self-pay | Admitting: Specialist

## 2014-07-07 DIAGNOSIS — M79605 Pain in left leg: Secondary | ICD-10-CM

## 2014-07-10 ENCOUNTER — Ambulatory Visit (HOSPITAL_COMMUNITY)
Admission: RE | Admit: 2014-07-10 | Discharge: 2014-07-10 | Disposition: A | Discharge status: Home / Self Care | Payer: Self-pay | Source: Ambulatory Visit | Attending: Specialist | Admitting: Specialist

## 2014-07-10 DIAGNOSIS — M7989 Other specified soft tissue disorders: Secondary | ICD-10-CM | POA: Insufficient documentation

## 2014-07-10 DIAGNOSIS — M79605 Pain in left leg: Secondary | ICD-10-CM

## 2014-07-10 DIAGNOSIS — Z01818 Encounter for other preprocedural examination: Secondary | ICD-10-CM | POA: Insufficient documentation

## 2014-07-10 DIAGNOSIS — M79609 Pain in unspecified limb: Secondary | ICD-10-CM | POA: Insufficient documentation

## 2014-07-10 DIAGNOSIS — Z01810 Encounter for preprocedural cardiovascular examination: Secondary | ICD-10-CM

## 2014-07-10 NOTE — Progress Notes (Signed)
VASCULAR LAB PRELIMINARY  PRELIMINARY  PRELIMINARY  PRELIMINARY  Left lower extremity venous duplex completed.    Preliminary report:  Left:  No evidence of DVT, superficial thrombosis, or Baker's cyst.  Dalyah Pla, RVS 07/10/2014, 1:14 PM

## 2014-07-20 ENCOUNTER — Other Ambulatory Visit: Payer: Self-pay | Admitting: Orthopedic Surgery

## 2014-08-01 ENCOUNTER — Encounter (HOSPITAL_BASED_OUTPATIENT_CLINIC_OR_DEPARTMENT_OTHER): Payer: Self-pay | Admitting: *Deleted

## 2014-08-01 NOTE — Progress Notes (Signed)
08/01/14 0942  OBSTRUCTIVE SLEEP APNEA  Have you ever been diagnosed with sleep apnea through a sleep study? No  Do you snore loudly (loud enough to be heard through closed doors)?  1  Do you often feel tired, fatigued, or sleepy during the daytime? 1  Has anyone observed you stop breathing during your sleep? 1  Do you have, or are you being treated for high blood pressure? 0  Gender: 1  Obstructive Sleep Apnea Score 4

## 2014-08-01 NOTE — Progress Notes (Signed)
Pt instructed npo p mn 10/07. Pt aware no smoking p mn as well. To [facility] 10/08 @ 1100.  Needs hgb on arrival.

## 2014-08-03 ENCOUNTER — Encounter (HOSPITAL_BASED_OUTPATIENT_CLINIC_OR_DEPARTMENT_OTHER): Payer: Self-pay | Admitting: *Deleted

## 2014-08-03 ENCOUNTER — Ambulatory Visit (HOSPITAL_BASED_OUTPATIENT_CLINIC_OR_DEPARTMENT_OTHER)
Admission: RE | Admit: 2014-08-03 | Discharge: 2014-08-03 | Disposition: A | Discharge status: Home / Self Care | Payer: Self-pay | Source: Ambulatory Visit | Attending: Specialist | Admitting: Specialist

## 2014-08-03 ENCOUNTER — Encounter (HOSPITAL_BASED_OUTPATIENT_CLINIC_OR_DEPARTMENT_OTHER): Payer: Self-pay | Admitting: Anesthesiology

## 2014-08-03 ENCOUNTER — Encounter (HOSPITAL_BASED_OUTPATIENT_CLINIC_OR_DEPARTMENT_OTHER)
Admission: RE | Disposition: A | Discharge status: Home / Self Care | Payer: Self-pay | Source: Ambulatory Visit | Attending: Specialist

## 2014-08-03 ENCOUNTER — Ambulatory Visit (HOSPITAL_BASED_OUTPATIENT_CLINIC_OR_DEPARTMENT_OTHER): Payer: Self-pay | Admitting: Anesthesiology

## 2014-08-03 DIAGNOSIS — F209 Schizophrenia, unspecified: Secondary | ICD-10-CM | POA: Insufficient documentation

## 2014-08-03 DIAGNOSIS — S83512A Sprain of anterior cruciate ligament of left knee, initial encounter: Secondary | ICD-10-CM | POA: Insufficient documentation

## 2014-08-03 DIAGNOSIS — Y929 Unspecified place or not applicable: Secondary | ICD-10-CM | POA: Insufficient documentation

## 2014-08-03 DIAGNOSIS — Z86718 Personal history of other venous thrombosis and embolism: Secondary | ICD-10-CM | POA: Insufficient documentation

## 2014-08-03 DIAGNOSIS — Z79899 Other long term (current) drug therapy: Secondary | ICD-10-CM | POA: Insufficient documentation

## 2014-08-03 DIAGNOSIS — Z9889 Other specified postprocedural states: Secondary | ICD-10-CM

## 2014-08-03 DIAGNOSIS — J45909 Unspecified asthma, uncomplicated: Secondary | ICD-10-CM | POA: Insufficient documentation

## 2014-08-03 DIAGNOSIS — X58XXXA Exposure to other specified factors, initial encounter: Secondary | ICD-10-CM | POA: Insufficient documentation

## 2014-08-03 DIAGNOSIS — F319 Bipolar disorder, unspecified: Secondary | ICD-10-CM | POA: Insufficient documentation

## 2014-08-03 DIAGNOSIS — F17210 Nicotine dependence, cigarettes, uncomplicated: Secondary | ICD-10-CM | POA: Insufficient documentation

## 2014-08-03 HISTORY — PX: KNEE ARTHROSCOPY: SHX127

## 2014-08-03 HISTORY — PX: ANTERIOR CRUCIATE LIGAMENT REPAIR: SHX115

## 2014-08-03 LAB — POCT HEMOGLOBIN-HEMACUE: Hemoglobin: 14.9 g/dL (ref 13.0–17.0)

## 2014-08-03 SURGERY — ARTHROSCOPY, KNEE
Anesthesia: Regional | Site: Knee | Laterality: Left

## 2014-08-03 MED ORDER — FENTANYL CITRATE 0.05 MG/ML IJ SOLN
INTRAMUSCULAR | Status: AC
  Filled 2014-08-03: qty 2

## 2014-08-03 MED ORDER — MIDAZOLAM HCL 2 MG/2ML IJ SOLN
1.0000 mg | INTRAMUSCULAR | Status: DC | PRN
  Administered 2014-08-03: 2 mg via INTRAVENOUS
  Filled 2014-08-03: qty 2

## 2014-08-03 MED ORDER — METHOCARBAMOL 500 MG PO TABS
ORAL_TABLET | ORAL | Status: AC
  Filled 2014-08-03: qty 1

## 2014-08-03 MED ORDER — HYDROMORPHONE HCL 1 MG/ML IJ SOLN
0.2500 mg | INTRAMUSCULAR | Status: DC | PRN
  Filled 2014-08-03: qty 1

## 2014-08-03 MED ORDER — DEXAMETHASONE SODIUM PHOSPHATE 4 MG/ML IJ SOLN
INTRAMUSCULAR | Status: DC | PRN
  Administered 2014-08-03: 8 mg via INTRAVENOUS

## 2014-08-03 MED ORDER — LACTATED RINGERS IV SOLN
INTRAVENOUS | Status: DC
  Administered 2014-08-03 (×2): via INTRAVENOUS
  Filled 2014-08-03: qty 1000

## 2014-08-03 MED ORDER — CEFAZOLIN SODIUM-DEXTROSE 2-3 GM-% IV SOLR
2.0000 g | INTRAVENOUS | Status: AC
Start: 2014-08-03 — End: 2014-08-03
  Administered 2014-08-03: 2 g via INTRAVENOUS
  Filled 2014-08-03: qty 50

## 2014-08-03 MED ORDER — POVIDONE-IODINE 7.5 % EX SOLN
Freq: Once | CUTANEOUS | Status: DC
Start: 2014-08-03 — End: 2014-08-03
  Filled 2014-08-03: qty 118

## 2014-08-03 MED ORDER — PROPOFOL 10 MG/ML IV BOLUS
INTRAVENOUS | Status: DC | PRN
  Administered 2014-08-03: 160 mg via INTRAVENOUS

## 2014-08-03 MED ORDER — CEPHALEXIN 500 MG PO CAPS: 500.0000 mg | ORAL_CAPSULE | Freq: Three times a day (TID) | ORAL | Status: DC

## 2014-08-03 MED ORDER — FENTANYL CITRATE 0.05 MG/ML IJ SOLN
50.0000 ug | INTRAMUSCULAR | Status: DC | PRN
  Administered 2014-08-03: 100 ug via INTRAVENOUS
  Filled 2014-08-03: qty 2

## 2014-08-03 MED ORDER — MORPHINE SULFATE 4 MG/ML IJ SOLN
INTRAMUSCULAR | Status: AC
  Filled 2014-08-03: qty 1

## 2014-08-03 MED ORDER — OXYCODONE-ACETAMINOPHEN 5-325 MG PO TABS
1.0000 | ORAL_TABLET | Freq: Once | ORAL | Status: AC
Start: 2014-08-03 — End: 2014-08-03
  Administered 2014-08-03: 1 via ORAL
  Filled 2014-08-03: qty 1

## 2014-08-03 MED ORDER — ONDANSETRON HCL 4 MG/2ML IJ SOLN
INTRAMUSCULAR | Status: DC | PRN
  Administered 2014-08-03: 4 mg via INTRAVENOUS

## 2014-08-03 MED ORDER — MIDAZOLAM HCL 2 MG/2ML IJ SOLN
INTRAMUSCULAR | Status: AC
  Filled 2014-08-03: qty 2

## 2014-08-03 MED ORDER — CEFAZOLIN (ANCEF) 1 G IV SOLR
1.0000 g | INTRAVENOUS | Status: DC
  Filled 2014-08-03: qty 1

## 2014-08-03 MED ORDER — OXYCODONE-ACETAMINOPHEN 5-325 MG PO TABS: 1.0000 | ORAL_TABLET | ORAL | Status: DC | PRN

## 2014-08-03 MED ORDER — OXYCODONE HCL 5 MG PO TABS
5.0000 mg | ORAL_TABLET | Freq: Once | ORAL | Status: DC | PRN
  Filled 2014-08-03: qty 1

## 2014-08-03 MED ORDER — FENTANYL CITRATE 0.05 MG/ML IJ SOLN
INTRAMUSCULAR | Status: DC | PRN
  Administered 2014-08-03 (×2): 50 ug via INTRAVENOUS
  Administered 2014-08-03: 25 ug via INTRAVENOUS
  Administered 2014-08-03: 50 ug via INTRAVENOUS
  Administered 2014-08-03 (×2): 25 ug via INTRAVENOUS
  Administered 2014-08-03: 50 ug via INTRAVENOUS
  Administered 2014-08-03: 25 ug via INTRAVENOUS

## 2014-08-03 MED ORDER — PROMETHAZINE HCL 25 MG/ML IJ SOLN
6.2500 mg | INTRAMUSCULAR | Status: DC | PRN
  Filled 2014-08-03: qty 1

## 2014-08-03 MED ORDER — OXYCODONE-ACETAMINOPHEN 5-325 MG PO TABS
ORAL_TABLET | ORAL | Status: AC
  Filled 2014-08-03: qty 1

## 2014-08-03 MED ORDER — METHOCARBAMOL 500 MG PO TABS: 500.0000 mg | ORAL_TABLET | Freq: Three times a day (TID) | ORAL | Status: DC | PRN

## 2014-08-03 MED ORDER — OXYCODONE HCL 5 MG/5ML PO SOLN
5.0000 mg | Freq: Once | ORAL | Status: DC | PRN
  Filled 2014-08-03: qty 5

## 2014-08-03 MED ORDER — FENTANYL CITRATE 0.05 MG/ML IJ SOLN
INTRAMUSCULAR | Status: AC
  Filled 2014-08-03: qty 6

## 2014-08-03 MED ORDER — ROPIVACAINE HCL 5 MG/ML IJ SOLN
INTRAMUSCULAR | Status: DC | PRN
  Administered 2014-08-03: 30 mL via PERINEURAL

## 2014-08-03 MED ORDER — CEFAZOLIN SODIUM-DEXTROSE 2-3 GM-% IV SOLR
INTRAVENOUS | Status: AC
  Filled 2014-08-03: qty 50

## 2014-08-03 MED ORDER — METHOCARBAMOL 500 MG PO TABS
500.0000 mg | ORAL_TABLET | Freq: Once | ORAL | Status: AC
  Administered 2014-08-03: 500 mg via ORAL
  Filled 2014-08-03: qty 1

## 2014-08-03 MED ORDER — MORPHINE SULFATE 4 MG/ML IJ SOLN
INTRAMUSCULAR | Status: DC | PRN
  Administered 2014-08-03: 4 mg via INTRAVENOUS

## 2014-08-03 MED ORDER — MEPERIDINE HCL 25 MG/ML IJ SOLN
6.2500 mg | INTRAMUSCULAR | Status: DC | PRN
  Filled 2014-08-03: qty 1

## 2014-08-03 MED ORDER — SODIUM CHLORIDE 0.9 % IR SOLN
Status: DC | PRN
  Administered 2014-08-03: 27000 mL

## 2014-08-03 MED ORDER — BUPIVACAINE HCL (PF) 0.25 % IJ SOLN
INTRAMUSCULAR | Status: DC | PRN
  Administered 2014-08-03: 30 mL

## 2014-08-03 MED ORDER — LIDOCAINE HCL (CARDIAC) 20 MG/ML IV SOLN
INTRAVENOUS | Status: DC | PRN
  Administered 2014-08-03: 60 mg via INTRAVENOUS

## 2014-08-03 SURGICAL SUPPLY — 94 items
ANCH SUT PUSHLCK 19.5X3.5 STRL (Anchor) ×1 IMPLANT
ANCHOR BUTTON TIGHTROPE BTB (Anchor) ×1 IMPLANT
ANCHOR PUSHLOCK PEEK 3.5X19.5 (Anchor) ×1 IMPLANT
ARTHREX INC ×1 IMPLANT
BANDAGE ESMARK 6X9 LF (GAUZE/BANDAGES/DRESSINGS) ×1 IMPLANT
BLADE 4.2CUDA (BLADE) IMPLANT
BLADE CUDA 4.2 (BLADE) IMPLANT
BLADE CUDA 5.5 (BLADE) ×2 IMPLANT
BLADE CUDA GRT WHITE 3.5 (BLADE) ×2 IMPLANT
BLADE CUDA SHAVER 3.5 (BLADE) IMPLANT
BLADE GREAT WHITE 4.2 (BLADE) IMPLANT
BLADE SURG 10 STRL SS (BLADE) ×2 IMPLANT
BLADE SURG 15 STRL LF DISP TIS (BLADE) ×1 IMPLANT
BLADE SURG 15 STRL SS (BLADE) ×2
BNDG CMPR 9X6 STRL LF SNTH (GAUZE/BANDAGES/DRESSINGS) ×1
BNDG ESMARK 6X9 LF (GAUZE/BANDAGES/DRESSINGS) ×2
BNDG GAUZE ELAST 4 BULKY (GAUZE/BANDAGES/DRESSINGS) ×3 IMPLANT
BUR OVAL 6.0 (BURR) IMPLANT
BUR VERTEX HOODED 4.5 (BURR) ×2 IMPLANT
CANISTER SUCT LVC 12 LTR MEDI- (MISCELLANEOUS) ×7 IMPLANT
CANISTER SUCTION 1200CC (MISCELLANEOUS) ×2 IMPLANT
CANISTER SUCTION 2500CC (MISCELLANEOUS) IMPLANT
CANNULA PASSPORT BUTTON 10X3 (MISCELLANEOUS) ×1 IMPLANT
CLOTH BEACON ORANGE TIMEOUT ST (SAFETY) ×2 IMPLANT
COVER TABLE BACK 60X90 (DRAPES) ×2 IMPLANT
CUFF TOURNIQUET SINGLE 34IN LL (TOURNIQUET CUFF) ×2 IMPLANT
CUTTER FLIP II 9.5MM (INSTRUMENTS) ×1 IMPLANT
DRAPE ARTHROSCOPY W/POUCH 114 (DRAPES) ×2 IMPLANT
DRAPE INCISE 23X17 IOBAN STRL (DRAPES) ×1
DRAPE INCISE 23X17 STRL (DRAPES) ×1 IMPLANT
DRAPE INCISE IOBAN 23X17 STRL (DRAPES) ×1 IMPLANT
DRAPE INCISE IOBAN 66X45 STRL (DRAPES) ×2 IMPLANT
DRAPE LG THREE QUARTER DISP (DRAPES) ×4 IMPLANT
DRAPE OEC MINIVIEW 54X84 (DRAPES) ×2 IMPLANT
DRAPE U-SHAPE 47X51 STRL (DRAPES) ×2 IMPLANT
DRSG PAD ABDOMINAL 8X10 ST (GAUZE/BANDAGES/DRESSINGS) ×1 IMPLANT
DURAPREP 26ML APPLICATOR (WOUND CARE) ×2 IMPLANT
ELECT MENISCUS 165MM 90D (ELECTRODE) IMPLANT
ELECT REM PT RETURN 9FT ADLT (ELECTROSURGICAL) ×2
ELECTRODE REM PT RTRN 9FT ADLT (ELECTROSURGICAL) ×1 IMPLANT
FIBERSTICK 2 (SUTURE) IMPLANT
GAUZE XEROFORM 1X8 LF (GAUZE/BANDAGES/DRESSINGS) ×2 IMPLANT
GLOVE BIO SURGEON STRL SZ7.5 (GLOVE) ×2 IMPLANT
GLOVE BIOGEL PI IND STRL 7.5 (GLOVE) IMPLANT
GLOVE BIOGEL PI INDICATOR 7.5 (GLOVE) ×2
GLOVE INDICATOR 8.0 STRL GRN (GLOVE) ×4 IMPLANT
GLOVE SURG ORTHO 8.0 STRL STRW (GLOVE) ×2 IMPLANT
GLOVE SURG SS PI 7.5 STRL IVOR (GLOVE) ×1 IMPLANT
GOWN PREVENTION PLUS LG XLONG (DISPOSABLE) ×2 IMPLANT
GOWN STRL REIN XL XLG (GOWN DISPOSABLE) ×4 IMPLANT
GOWN STRL REUS W/TWL XL LVL3 (GOWN DISPOSABLE) ×2 IMPLANT
GRAFT TISS 60-80 FRZN TENDON (Tissue) IMPLANT
IMMOBILIZER KNEE 22 UNIV (SOFTGOODS) IMPLANT
IMMOBILIZER KNEE 24 THIGH 36 (MISCELLANEOUS) IMPLANT
IMMOBILIZER KNEE 24 UNIV (MISCELLANEOUS)
IV NS IRRIG 3000ML ARTHROMATIC (IV SOLUTION) ×13 IMPLANT
KIT BUTTON TIGHTROPE ABS 8X12 (Anchor) ×1 IMPLANT
KIT IMPLANT TIGHTROPE ABS OPEN (Anchor) ×1 IMPLANT
KNEE WRAP E Z 3 GEL PACK (MISCELLANEOUS) ×2 IMPLANT
MINI VAC (SURGICAL WAND) ×2 IMPLANT
NEEDLE HYPO 22GX1.5 SAFETY (NEEDLE) ×2 IMPLANT
NS IRRIG 500ML POUR BTL (IV SOLUTION) ×1 IMPLANT
PACK ARTHROSCOPY DSU (CUSTOM PROCEDURE TRAY) ×2 IMPLANT
PACK BASIN DAY SURGERY FS (CUSTOM PROCEDURE TRAY) ×2 IMPLANT
PAD ABD 8X10 STRL (GAUZE/BANDAGES/DRESSINGS) ×4 IMPLANT
PADDING CAST ABS 4INX4YD NS (CAST SUPPLIES) ×1
PADDING CAST ABS COTTON 4X4 ST (CAST SUPPLIES) ×1 IMPLANT
PENCIL BUTTON HOLSTER BLD 10FT (ELECTRODE) IMPLANT
SET ARTHROSCOPY TUBING (MISCELLANEOUS) ×2
SET ARTHROSCOPY TUBING LN (MISCELLANEOUS) ×1 IMPLANT
SET PAD KNEE POSITIONER (MISCELLANEOUS) ×2 IMPLANT
SPONGE GAUZE 4X4 12PLY (GAUZE/BANDAGES/DRESSINGS) ×4 IMPLANT
SPONGE GAUZE 4X4 12PLY STER LF (GAUZE/BANDAGES/DRESSINGS) ×1 IMPLANT
SPONGE LAP 4X18 X RAY DECT (DISPOSABLE) ×2 IMPLANT
STRIP CLOSURE SKIN 1/2X4 (GAUZE/BANDAGES/DRESSINGS) IMPLANT
SUT 2 FIBERLOOP 20 STRT BLUE (SUTURE) ×2
SUT ETHILON 4 0 PS 2 18 (SUTURE) ×2 IMPLANT
SUT FIBERWIRE #2 38 T-5 BLUE (SUTURE) ×4
SUT MNCRL AB 3-0 PS2 18 (SUTURE) ×2 IMPLANT
SUT VIC AB 0 CT1 36 (SUTURE) ×4 IMPLANT
SUT VIC AB 0 CT2 27 (SUTURE) IMPLANT
SUT VIC AB 2-0 CT1 27 (SUTURE) ×2
SUT VIC AB 2-0 CT1 TAPERPNT 27 (SUTURE) ×1 IMPLANT
SUTURE 2 FIBERLOOP 20 STRT BLU (SUTURE) IMPLANT
SUTURE FIBERWR #2 38 T-5 BLUE (SUTURE) IMPLANT
SUTURE TIGERSTICK 2 TIGERWIR 2 (MISCELLANEOUS) IMPLANT
SYR CONTROL 10ML LL (SYRINGE) ×2 IMPLANT
TIGERSTICK 2 TIGERWIRE 2 (MISCELLANEOUS)
TISSUE GRAFTLINK FGL (Tissue) ×2 IMPLANT
TOWEL OR 17X24 6PK STRL BLUE (TOWEL DISPOSABLE) ×3 IMPLANT
TUBE CONNECTING 12X1/4 (SUCTIONS) ×2 IMPLANT
WAND 30 DEG SABER W/CORD (SURGICAL WAND) IMPLANT
WAND 90 DEG TURBOVAC W/CORD (SURGICAL WAND) IMPLANT
WATER STERILE IRR 500ML POUR (IV SOLUTION) ×2 IMPLANT

## 2014-08-03 NOTE — Anesthesia Procedure Notes (Addendum)
Anesthesia Regional Block:  Femoral nerve block  Pre-Anesthetic Checklist: ,, timeout performed, Correct Patient, Correct Site, Correct Laterality, Correct Procedure, Correct Position, site marked, Risks and benefits discussed,  Surgical consent,  Pre-op evaluation,  At surgeon's request and post-op pain management  Laterality: Left  Prep: chloraprep       Needles:  Injection technique: Single-shot  Needle Type: Stimulator Needle - 80     Needle Length: 9cm 9 cm Needle Gauge: 22 and 22 G  Needle insertion depth: 6 cm   Additional Needles:  Procedures: nerve stimulator Femoral nerve block  Nerve Stimulator or Paresthesia:  Response: Twitch elicited, 0.5 mA,   Additional Responses:   Narrative:   Performed by: Personally  Anesthesiologist: Germeroth, MD  Additional Notes: BP cuff, EKG monitors applied. Sedation begun. Femoral artery palpated for location of nerve. After nerve location also via ultrasound anesthetic injected incrementally, slowly , and after neg aspirations under direct ultrasound guidance. Good anesthetic spread around nerve, no intraneural injection. Tolerated well.   Procedure Name: LMA Insertion Date/Time: 08/03/2014 1:38 PM Performed by: Denna Haggard D Pre-anesthesia Checklist: Patient identified, Emergency Drugs available, Suction available and Patient being monitored Patient Re-evaluated:Patient Re-evaluated prior to inductionOxygen Delivery Method: Circle System Utilized Preoxygenation: Pre-oxygenation with 100% oxygen Intubation Type: IV induction Ventilation: Mask ventilation without difficulty LMA: LMA inserted LMA Size: 4.0 Number of attempts: 1 Airway Equipment and Method: bite block Placement Confirmation: positive ETCO2 Tube secured with: Tape Dental Injury: Teeth and Oropharynx as per pre-operative assessment

## 2014-08-03 NOTE — Discharge Instructions (Signed)
°  Post Anesthesia Home Care Instructions ° °Activity: °Get plenty of rest for the remainder of the day. A responsible adult should stay with you for 24 hours following the procedure.  °For the next 24 hours, DO NOT: °-Drive a car °-Operate machinery °-Drink alcoholic beverages °-Take any medication unless instructed by your physician °-Make any legal decisions or sign important papers. ° °Meals: °Start with liquid foods such as gelatin or soup. Progress to regular foods as tolerated. Avoid greasy, spicy, heavy foods. If nausea and/or vomiting occur, drink only clear liquids until the nausea and/or vomiting subsides. Call your physician if vomiting continues. ° °Special Instructions/Symptoms: °Your throat may feel dry or sore from the anesthesia or the breathing tube placed in your throat during surgery. If this causes discomfort, gargle with warm salt water. The discomfort should disappear within 24 hours. ° °Regional Anesthesia Blocks ° °1. Numbness or the inability to move the "blocked" extremity may last from 3-48 hours after placement. The length of time depends on the medication injected and your individual response to the medication. If the numbness is not going away after 48 hours, call your surgeon. ° °2. The extremity that is blocked will need to be protected until the numbness is gone and the  Strength has returned. Because you cannot feel it, you will need to take extra care to avoid injury. Because it may be weak, you may have difficulty moving it or using it. You may not know what position it is in without looking at it while the block is in effect. ° °3. For blocks in the legs and feet, returning to weight bearing and walking needs to be done carefully. You will need to wait until the numbness is entirely gone and the strength has returned. You should be able to move your leg and foot normally before you try and bear weight or walk. You will need someone to be with you when you first try to ensure you  do not fall and possibly risk injury. ° °4. Bruising and tenderness at the needle site are common side effects and will resolve in a few days. ° °5. Persistent numbness or new problems with movement should be communicated to the surgeon or the [facility] ([phone])/ [facility] ([phone]). °

## 2014-08-03 NOTE — Interval H&P Note (Signed)
History and Physical Interval Note:  08/03/2014 1:27 PM  Douglas Sloan  has presented today for surgery, with the diagnosis of LEFT KNEE ACL TEAR/MENISCUS TEAR  The various methods of treatment have been discussed with the patient and family. After consideration of risks, benefits and other options for treatment, the patient has consented to  Procedure(s): LEFT ARTHROSCOPY KNEE WITH DEBRIDEMENT (Left) LEFT ALLOGRAFT ACL RECONSTRUCTION/PARTIAL MENISECTOMY VERSES REPAIR (Left) as a surgical intervention .  The patient's history has been reviewed, patient examined, no change in status, stable for surgery.  I have reviewed the patient's chart and labs.  Questions were answered to the patient's satisfaction.     Neilani Duffee ANDREW

## 2014-08-03 NOTE — H&P (View-Only) (Signed)
08/01/14 0942  OBSTRUCTIVE SLEEP APNEA  Have you ever been diagnosed with sleep apnea through a sleep study? No  Do you snore loudly (loud enough to be heard through closed doors)?  1  Do you often feel tired, fatigued, or sleepy during the daytime? 1  Has anyone observed you stop breathing during your sleep? 1  Do you have, or are you being treated for high blood pressure? 0  Gender: 1  Obstructive Sleep Apnea Score 4

## 2014-08-03 NOTE — H&P (View-Only) (Signed)
VASCULAR LAB PRELIMINARY  PRELIMINARY  PRELIMINARY  PRELIMINARY  Left lower extremity venous duplex completed.    Preliminary report:  Left:  No evidence of DVT, superficial thrombosis, or Baker's cyst.  Diamante Rubin, RVS 07/10/2014, 1:14 PM

## 2014-08-03 NOTE — Transfer of Care (Signed)
Immediate Anesthesia Transfer of Care Note  Patient: Douglas Sloan  Procedure(s) Performed: Procedure(s) (LRB): LEFT ARTHROSCOPY KNEE WITH DEBRIDEMENT (Left) LEFT ALLOGRAFT ACL RECONSTRUCTION/ (Left)  Patient Location: PACU  Anesthesia Type: General  Level of Consciousness: awake, oriented, sedated and patient cooperative  Airway & Oxygen Therapy: Patient Spontanous Breathing and Patient connected to face mask oxygen  Post-op Assessment: Report given to PACU RN and Post -op Vital signs reviewed and stable  Post vital signs: Reviewed and stable  Complications: No apparent anesthesia complications

## 2014-08-03 NOTE — H&P (View-Only) (Signed)
Pt instructed npo p mn 10/07. Pt aware no smoking p mn as well. To [facility] 10/08 @ 1100.  Needs hgb on arrival.

## 2014-08-03 NOTE — Anesthesia Preprocedure Evaluation (Signed)
Anesthesia Evaluation  Patient identified by MRN, date of birth, ID band Patient awake    Reviewed: Allergy & Precautions, H&P , NPO status , Patient's Chart, lab work & pertinent test results  Airway Mallampati: II TM Distance: >3 FB Neck ROM: Full    Dental no notable dental hx.    Pulmonary asthma , Current Smoker,  breath sounds clear to auscultation  Pulmonary exam normal       Cardiovascular + Peripheral Vascular Disease Rhythm:Regular Rate:Normal     Neuro/Psych PSYCHIATRIC DISORDERS Bipolar Disorder Schizophrenia negative neurological ROS     GI/Hepatic negative GI ROS, Neg liver ROS,   Endo/Other  negative endocrine ROS  Renal/GU negative Renal ROS     Musculoskeletal negative musculoskeletal ROS (+)   Abdominal   Peds  Hematology negative hematology ROS (+)   Anesthesia Other Findings   Reproductive/Obstetrics negative OB ROS                           Anesthesia Physical Anesthesia Plan  ASA: II  Anesthesia Plan: General and Regional   Post-op Pain Management:    Induction: Intravenous  Airway Management Planned: LMA  Additional Equipment:   Intra-op Plan:   Post-operative Plan: Extubation in OR  Informed Consent: I have reviewed the patients History and Physical, chart, labs and discussed the procedure including the risks, benefits and alternatives for the proposed anesthesia with the patient or authorized representative who has indicated his/her understanding and acceptance.   Dental advisory given  Plan Discussed with: CRNA  Anesthesia Plan Comments:         Anesthesia Quick Evaluation
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
## 2014-08-03 NOTE — Op Note (Signed)
Dictated#[id number]

## 2014-08-03 NOTE — Interval H&P Note (Signed)
History and Physical Interval Note:  08/03/2014 1:27 PM  Douglas Sloan  has presented today for surgery, with the diagnosis of LEFT KNEE ACL TEAR/MENISCUS TEAR  The various methods of treatment have been discussed with the patient and family. After consideration of risks, benefits and other options for treatment, the patient has consented to  Procedure(s): LEFT ARTHROSCOPY KNEE WITH DEBRIDEMENT (Left) LEFT ALLOGRAFT ACL RECONSTRUCTION/PARTIAL MENISECTOMY VERSES REPAIR (Left) as a surgical intervention .  The patient's history has been reviewed, patient examined, no change in status, stable for surgery.  I have reviewed the patient's chart and labs.  Questions were answered to the patient's satisfaction.     Sajad Glander ANDREW

## 2014-08-03 NOTE — Anesthesia Postprocedure Evaluation (Signed)
  Anesthesia Post-op Note  Patient: Douglas Sloan  Procedure(s) Performed: Procedure(s) (LRB): LEFT ARTHROSCOPY KNEE WITH DEBRIDEMENT (Left) LEFT ALLOGRAFT ACL RECONSTRUCTION/ (Left)  Patient Location: PACU  Anesthesia Type: GA combined with regional for post-op pain  Level of Consciousness: awake and alert   Airway and Oxygen Therapy: Patient Spontanous Breathing  Post-op Pain: mild  Post-op Assessment: Post-op Vital signs reviewed, Patient's Cardiovascular Status Stable, Respiratory Function Stable, Patent Airway and No signs of Nausea or vomiting  Last Vitals:  Filed Vitals:   08/03/14 1630  BP: 135/73  Pulse: 80  Temp:   Resp: 19    Post-op Vital Signs: stable   Complications: No apparent anesthesia complications

## 2014-08-03 NOTE — Interval H&P Note (Signed)
History and Physical Interval Note:  08/03/2014 1:27 PM  Douglas Sloan  has presented today for surgery, with the diagnosis of LEFT KNEE ACL TEAR/MENISCUS TEAR  The various methods of treatment have been discussed with the patient and family. After consideration of risks, benefits and other options for treatment, the patient has consented to  Procedure(s): LEFT ARTHROSCOPY KNEE WITH DEBRIDEMENT (Left) LEFT ALLOGRAFT ACL RECONSTRUCTION/PARTIAL MENISECTOMY VERSES REPAIR (Left) as a surgical intervention .  The patient's history has been reviewed, patient examined, no change in status, stable for surgery.  I have reviewed the patient's chart and labs.  Questions were answered to the patient's satisfaction.     [name] ANDREW

## 2014-08-04 NOTE — Op Note (Signed)
Douglas Sloan, Douglas Sloan NO.:  [id number]  MEDICAL RECORD NO.:  [id number]  LOCATION:                                 FACILITY:  PHYSICIAN:  Cynda Familia, M.D.DATE OF BIRTH:  05-15-1981  DATE OF PROCEDURE:  08/03/2014 DATE OF DISCHARGE:  08/04/2014                              OPERATIVE REPORT   PREOPERATIVE DIAGNOSES:  Left knee torn anterior cruciate ligament, possible torn meniscus.  POSTOPERATIVE DIAGNOSES:  Left knee complete rupture anterior cruciate ligament.  PROCEDURE:  Left knee arthroscopically-assisted allograft, anterior cruciate ligament reconstruction.  SURGEON:  Cynda Familia, M.D.  ASSISTANT:  Wyatt Portela, PA-C.  ANESTHESIA:  Femoral nerve block with general.  ESTIMATED BLOOD LOSS:  Minimal.  DRAINS:  None.  COMPLICATIONS:  None.  TOURNIQUET TIME:  83 minutes at 350 mmHg.  This is due to the large size of his thigh.  DRAINS:  None.  COMPLICATIONS:  None.  DISPOSITION:  PACU, stable.  OPERATIVE DETAILS:  The patient was counseled in the holding area. Correct site was identified, marked, and signed appropriately.  Femoral nerve block was administered, TED hose for uninvolved leg.  Taken to the operating room, placed in a supine position under general anesthesia, 2 g of Ancef was given prophylactically.  Left lower extremity was elevated, prepped with DuraPrep and draped into a sterile fashion. Exsanguinated with an Esmarch.  Tourniquet inflated to 350 mmHg due to the large size of the thigh.  Arthroscopic portal was established; posteromedial, inferomedial, inferolateral.  Diagnostic arthroscopy __________ joint articular cartilage, suprapatellar pouch.  Medial and lateral gutters were unremarkable.  Intercondylar notch reveals complete __________ anterior cruciate ligament.  ACL fibers debrided, notchplasty was performed, and PCL was protected.  Medial side was inspected, normal articular cartilage and  medial meniscus.  Lateral side was inspected, normal articular cartilage and lateral meniscus.  The Arthrex guide __________ position.  Small incision was made laterally.  Arthrex flip cutter was put into place to anatomical ACL footprint and retro socket was performed.  FiberWire suture passed in place.  On the tibial side, a small incision was made at the inferior border of the tibia and to localize with a 60-degree guide.  The retro cutter was placed in this and retro socket was formed.  Both tunnels were debrided.  FiberWire suture passer placed at the inferomedial portal site.  At this point in time, the graft was delivered to the knee joint.  The femoral side was placed in a retrograde fashion.  We confirmed that the button on the ACL Tightrope system had deployed on the periosteum that was arthroscopically palpable and visualized radiographically.  __________ retrograde fashion and then we dunked the tibial side in the tibial socket, 30 degrees of flexion, neutral rotation.  Excellent orientation and tension of the graft.  At this point in time, a button was placed on the tibial side and secured down nicely.  This was supplemented additionally with a PushLock anchor. Knee was put through range of motion, and at this point in time it was clinically stable on both sides.  Graft had excellent orientation and tension.  The knee was nice and stable.  Arthroscopic  equipments was removed.  Portals were closed with 4-0 nylon suture.  Anteromedial incision was closed with subcu Vicryl __________ nylon.  10 mL of 0.25% Sensorcaine and 4 mg of morphine sulfate was injected in the knee joint. Sterile dressing was applied along with TED hose and ice pack, placed __________ full extension and ice pack.  __________ tourniquet deflated. No complication.  No problems.  He was awakened, taken from the operating room to PACU in stable condition.  No complication problems. We stabilized in PACU,  discharged to home.  Helped the patient positioning, prepping, draping, technical and surgical assistance throughout entire case, wound closure, __________ application, dressing splint, and graft preparation, Mr. Wyatt Portela, PA-C's assistance was needed.          ______________________________ Cynda Familia, M.D.     RAC/MEDQ  D:  08/03/2014  T:  08/04/2014  Job:  [id number]

## 2014-08-08 ENCOUNTER — Encounter (HOSPITAL_BASED_OUTPATIENT_CLINIC_OR_DEPARTMENT_OTHER): Payer: Self-pay | Admitting: Specialist

## 2014-08-16 ENCOUNTER — Ambulatory Visit: Payer: Self-pay | Attending: Physical Therapy | Admitting: Physical Therapy

## 2014-08-16 ENCOUNTER — Ambulatory Visit: Payer: Self-pay | Attending: Internal Medicine | Admitting: Internal Medicine

## 2014-08-16 ENCOUNTER — Encounter: Payer: Self-pay | Admitting: Internal Medicine

## 2014-08-16 DIAGNOSIS — M2560 Stiffness of unspecified joint, not elsewhere classified: Secondary | ICD-10-CM | POA: Insufficient documentation

## 2014-08-16 DIAGNOSIS — J45909 Unspecified asthma, uncomplicated: Secondary | ICD-10-CM | POA: Insufficient documentation

## 2014-08-16 DIAGNOSIS — R262 Difficulty in walking, not elsewhere classified: Secondary | ICD-10-CM | POA: Insufficient documentation

## 2014-08-16 DIAGNOSIS — Z5189 Encounter for other specified aftercare: Secondary | ICD-10-CM | POA: Insufficient documentation

## 2014-08-16 DIAGNOSIS — R609 Edema, unspecified: Secondary | ICD-10-CM | POA: Insufficient documentation

## 2014-08-16 DIAGNOSIS — M25569 Pain in unspecified knee: Secondary | ICD-10-CM | POA: Insufficient documentation

## 2014-08-16 NOTE — Progress Notes (Signed)
Patient left without being seen by provider.

## 2014-08-16 NOTE — Patient Instructions (Signed)
Pt left before being seen by provider. States he has scheduled appointment with PT @ 1245pm. Pt states he will reschedule later
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
## 2014-08-17 ENCOUNTER — Ambulatory Visit: Payer: Self-pay | Admitting: Physical Therapy

## 2014-08-22 ENCOUNTER — Ambulatory Visit: Payer: Self-pay | Admitting: Physical Therapy

## 2014-08-24 ENCOUNTER — Ambulatory Visit: Payer: Self-pay | Admitting: Physical Therapy

## 2014-08-29 ENCOUNTER — Ambulatory Visit: Payer: Self-pay | Attending: Specialist | Admitting: Physical Therapy

## 2014-08-29 DIAGNOSIS — R609 Edema, unspecified: Secondary | ICD-10-CM | POA: Insufficient documentation

## 2014-08-29 DIAGNOSIS — J45909 Unspecified asthma, uncomplicated: Secondary | ICD-10-CM | POA: Insufficient documentation

## 2014-08-29 DIAGNOSIS — M2560 Stiffness of unspecified joint, not elsewhere classified: Secondary | ICD-10-CM | POA: Insufficient documentation

## 2014-08-29 DIAGNOSIS — R262 Difficulty in walking, not elsewhere classified: Secondary | ICD-10-CM | POA: Insufficient documentation

## 2014-08-29 DIAGNOSIS — Z5189 Encounter for other specified aftercare: Secondary | ICD-10-CM | POA: Insufficient documentation

## 2014-08-29 DIAGNOSIS — M25569 Pain in unspecified knee: Secondary | ICD-10-CM | POA: Insufficient documentation

## 2014-08-30 ENCOUNTER — Telehealth: Payer: Self-pay | Admitting: Internal Medicine

## 2014-08-30 ENCOUNTER — Telehealth: Payer: Self-pay | Admitting: Emergency Medicine

## 2014-08-30 DIAGNOSIS — I82402 Acute embolism and thrombosis of unspecified deep veins of left lower extremity: Secondary | ICD-10-CM

## 2014-08-30 MED ORDER — RIVAROXABAN 20 MG PO TABS: 20.0000 mg | ORAL_TABLET | Freq: Every day | ORAL | Status: DC

## 2014-08-30 MED ORDER — GABAPENTIN 300 MG PO CAPS: 300.0000 mg | ORAL_CAPSULE | Freq: Two times a day (BID) | ORAL | Status: DC

## 2014-08-30 NOTE — Telephone Encounter (Signed)
Patient calling to request medication refill for all of his medications. Please assist.

## 2014-08-30 NOTE — Telephone Encounter (Signed)
Pt called in for refill Xeralto and Gabapentin Medications reordered and e-scribed to [facility]

## 2014-08-30 NOTE — Telephone Encounter (Signed)
Patient calling to request medication refill for all of his meds. Please assist.

## 2014-08-31 ENCOUNTER — Ambulatory Visit: Payer: Self-pay | Admitting: Physical Therapy

## 2014-09-05 ENCOUNTER — Ambulatory Visit: Payer: Self-pay | Admitting: Physical Therapy

## 2014-09-07 ENCOUNTER — Ambulatory Visit: Payer: Self-pay | Admitting: Physical Therapy

## 2014-09-12 ENCOUNTER — Ambulatory Visit: Payer: Self-pay | Admitting: Physical Therapy

## 2014-09-15 ENCOUNTER — Ambulatory Visit: Payer: Self-pay | Admitting: Physical Therapy

## 2014-09-18 ENCOUNTER — Ambulatory Visit (HOSPITAL_BASED_OUTPATIENT_CLINIC_OR_DEPARTMENT_OTHER): Payer: No Typology Code available for payment source

## 2014-09-18 ENCOUNTER — Ambulatory Visit: Payer: Self-pay | Attending: Internal Medicine | Admitting: Internal Medicine

## 2014-09-18 ENCOUNTER — Encounter: Payer: Self-pay | Admitting: Internal Medicine

## 2014-09-18 VITALS — BP 137/85 | HR 76 | Temp 98.6°F | Resp 16 | Wt 241.6 lb

## 2014-09-18 DIAGNOSIS — Z86718 Personal history of other venous thrombosis and embolism: Secondary | ICD-10-CM | POA: Insufficient documentation

## 2014-09-18 DIAGNOSIS — Z23 Encounter for immunization: Secondary | ICD-10-CM

## 2014-09-18 DIAGNOSIS — Z79891 Long term (current) use of opiate analgesic: Secondary | ICD-10-CM | POA: Insufficient documentation

## 2014-09-18 DIAGNOSIS — I82402 Acute embolism and thrombosis of unspecified deep veins of left lower extremity: Secondary | ICD-10-CM

## 2014-09-18 DIAGNOSIS — Z7952 Long term (current) use of systemic steroids: Secondary | ICD-10-CM | POA: Insufficient documentation

## 2014-09-18 DIAGNOSIS — Z7901 Long term (current) use of anticoagulants: Secondary | ICD-10-CM | POA: Insufficient documentation

## 2014-09-18 DIAGNOSIS — F17200 Nicotine dependence, unspecified, uncomplicated: Secondary | ICD-10-CM

## 2014-09-18 DIAGNOSIS — Z791 Long term (current) use of non-steroidal anti-inflammatories (NSAID): Secondary | ICD-10-CM | POA: Insufficient documentation

## 2014-09-18 DIAGNOSIS — R21 Rash and other nonspecific skin eruption: Secondary | ICD-10-CM

## 2014-09-18 DIAGNOSIS — Z720 Tobacco use: Secondary | ICD-10-CM

## 2014-09-18 DIAGNOSIS — A6000 Herpesviral infection of urogenital system, unspecified: Secondary | ICD-10-CM | POA: Insufficient documentation

## 2014-09-18 DIAGNOSIS — L299 Pruritus, unspecified: Secondary | ICD-10-CM

## 2014-09-18 DIAGNOSIS — F17210 Nicotine dependence, cigarettes, uncomplicated: Secondary | ICD-10-CM | POA: Insufficient documentation

## 2014-09-18 DIAGNOSIS — F209 Schizophrenia, unspecified: Secondary | ICD-10-CM | POA: Insufficient documentation

## 2014-09-18 DIAGNOSIS — Z8619 Personal history of other infectious and parasitic diseases: Secondary | ICD-10-CM

## 2014-09-18 DIAGNOSIS — F319 Bipolar disorder, unspecified: Secondary | ICD-10-CM | POA: Insufficient documentation

## 2014-09-18 MED ORDER — METHYLPREDNISOLONE 4 MG PO KIT: PACK | ORAL | Status: DC

## 2014-09-18 MED ORDER — CETIRIZINE HCL 10 MG PO TABS
10.0000 mg | ORAL_TABLET | Freq: Every day | ORAL | Status: DC
Start: 2014-09-18 — End: 2015-06-14

## 2014-09-18 MED ORDER — ACYCLOVIR 400 MG PO TABS: 400.0000 mg | ORAL_TABLET | Freq: Every day | ORAL | Status: DC

## 2014-09-18 NOTE — Progress Notes (Signed)
MRN: [id number] Name: Douglas Sloan  Sex: male Age: 33 y.o. DOB: [birth year]  Allergies: No known allergies  Chief Complaint  Patient presents with  . Follow-up    HPI: Patient is 33 y.o. male who has history of DVT after he had left fibular fracture in June since then he has been on Xarelto, patient denies any leg pain already had another ultrasound done which was negative for DVT, discussed about  the duration, patient recently had left knee ACL reconstruction done, patient will continue Xarelto until January to February next year by then he would have taken it for 9 months. Today his major concern was itchy rash on his trunk hand groin, patient does report change in soap recently denies any fever chills, he also reported to have history of genital herpes and is requesting treatment. As per patient he noticed some of the lesions last week and is groin which is now improved. Patient denies any urinary symptoms.  Past Medical History  Diagnosis Date  . DVT (deep venous thrombosis)     denies taking xarelto x1 month.  states they plan to put me on some after surgery.  . Asthma     denies meds  . Schizophrenia     denies taking any meds  . Bipolar disorder     denies taking any meds    Past Surgical History  Procedure Laterality Date  . Facial cosmetic surgery      reconstructive  . Appendectomy    . Knee arthroscopy Left 08/03/2014    Procedure: LEFT ARTHROSCOPY KNEE WITH DEBRIDEMENT;  Surgeon: Sydnee Cabal, MD;  [facility];  Service: Orthopedics;  Laterality: Left;  . Anterior cruciate ligament repair Left 08/03/2014    Procedure: LEFT ALLOGRAFT ACL RECONSTRUCTION/;  Surgeon: Sydnee Cabal, MD;  Location: [facility];  Service: Orthopedics;  Laterality: Left;      Medication List       This list is accurate as of: 09/18/14  3:27 PM.  Always use your most recent med list.               acyclovir 400 MG tablet  Commonly known  as:  ZOVIRAX  Take 1 tablet (400 mg total) by mouth 5 (five) times daily.     cephALEXin 500 MG capsule  Commonly known as:  KEFLEX  Take 1 capsule (500 mg total) by mouth 3 (three) times daily.     cetirizine 10 MG tablet  Commonly known as:  ZYRTEC  Take 1 tablet (10 mg total) by mouth daily.     gabapentin 300 MG capsule  Commonly known as:  NEURONTIN  Take 1 capsule (300 mg total) by mouth 2 (two) times daily.     hydrocortisone cream 1 %  Apply 1 application topically as needed for itching.     methocarbamol 500 MG tablet  Commonly known as:  ROBAXIN  Take 1 tablet (500 mg total) by mouth every 8 (eight) hours as needed.     methylPREDNISolone 4 MG tablet  Commonly known as:  MEDROL DOSEPAK  follow package directions     naproxen 375 MG tablet  Commonly known as:  NAPROSYN  Take 1 tablet (375 mg total) by mouth 2 (two) times daily.     oxyCODONE-acetaminophen 5-325 MG per tablet  Commonly known as:  ROXICET  Take 1-2 tablets by mouth every 4 (four) hours as needed.     PRESCRIPTION MEDICATION  Take 1 tablet by  mouth 3 (three) times daily. MED IS FOR Schizophrenia     rivaroxaban 20 MG Tabs tablet  Commonly known as:  XARELTO  Take 1 tablet (20 mg total) by mouth daily with supper.     SEROQUEL 100 MG tablet  Generic drug:  QUEtiapine  Take 300 mg by mouth at bedtime.     Vitamin D (Ergocalciferol) 50000 UNITS Caps capsule  Commonly known as:  DRISDOL  Take 1 capsule (50,000 Units total) by mouth every 7 (seven) days.        Meds ordered this encounter  Medications  . acyclovir (ZOVIRAX) 400 MG tablet    Sig: Take 1 tablet (400 mg total) by mouth 5 (five) times daily.    Dispense:  30 tablet    Refill:  0  . methylPREDNISolone (MEDROL DOSEPAK) 4 MG tablet    Sig: follow package directions    Dispense:  21 tablet    Refill:  0  . cetirizine (ZYRTEC) 10 MG tablet    Sig: Take 1 tablet (10 mg total) by mouth daily.    Dispense:  30 tablet    Refill:   1     There is no immunization history on file for this patient.  Family History  Problem Relation Age of Onset  . Diabetes Other   . Heart failure Other   . Diabetes Paternal Uncle     History  Substance Use Topics  . Smoking status: Current Every Day Smoker -- 0.25 packs/day for 10 years    Types: Cigarettes  . Smokeless tobacco: Not on file  . Alcohol Use: Yes    Review of Systems   As noted in HPI  Filed Vitals:   09/18/14 1440  BP: 137/85  Pulse: 76  Temp: 98.6 F (37 C)  Resp: 16    Physical Exam  Physical Exam  Constitutional: No distress.  Eyes: EOM are normal. Pupils are equal, round, and reactive to light.  Cardiovascular: Normal rate and regular rhythm.   Pulmonary/Chest: Breath sounds normal. No respiratory distress. He has no wheezes. He has no rales.  Genitourinary:  Groin has dry skin no apparent vesicles no discharge from penis., no lymphadenopathy.  Skin:  Macular rash on the abdomen    CBC    Component Value Date/Time   WBC 9.3 05/02/2014 1107   RBC 5.05 05/02/2014 1107   HGB 14.9 08/03/2014 1056   HCT 43.2 05/02/2014 1107   PLT 348 05/02/2014 1107   MCV 85.5 05/02/2014 1107   LYMPHSABS 2.5 05/02/2014 1107   MONOABS 0.8 05/02/2014 1107   EOSABS 0.3 05/02/2014 1107   BASOSABS 0.0 05/02/2014 1107    CMP     Component Value Date/Time   NA 140 04/24/2014 0020   K 4.2 04/24/2014 0020   CL 101 04/24/2014 0020   CO2 27 04/24/2014 0020   GLUCOSE 102* 04/24/2014 0020   BUN 13 04/24/2014 0020   CREATININE 0.99 04/24/2014 0020   CREATININE 0.93 04/03/2014 1101   CALCIUM 9.6 04/24/2014 0020   PROT 6.7 04/03/2014 1101   ALBUMIN 4.0 04/03/2014 1101   AST 31 04/03/2014 1101   ALT 56* 04/03/2014 1101   ALKPHOS 102 04/03/2014 1101   BILITOT 0.5 04/03/2014 1101   GFRNONAA >90 04/24/2014 0020   GFRNONAA >89 04/03/2014 1101   GFRAA >90 04/24/2014 0020   GFRAA >89 04/03/2014 1101    Lab Results  Component Value Date/Time   CHOL 129  04/03/2014 11:01 AM    No  components found for: HGA1C  Lab Results  Component Value Date/Time   AST 31 04/03/2014 11:01 AM    Assessment and Plan  Rash and nonspecific skin eruption - Plan: methylPREDNISolone (MEDROL DOSEPAK) 4 MG tablet  Itching - Plan: cetirizine (ZYRTEC) 10 MG tablet  History of herpes genitalis - Plan: acyclovir (ZOVIRAX) 400 MG tablet  Smoking Advised patient to quit smoking.  Left leg DVT Currently on Xarelto will continue until January to February 2016.  Needs flu shot Flu shot given today  Need for prophylactic vaccination against Streptococcus pneumoniae (pneumococcus) Pneumonia shot given today.  Health Maintenance Flu shot and Pneumovax today.  Return in about 3 months (around 12/19/2014).  Lorayne Marek, MD

## 2014-09-18 NOTE — Progress Notes (Signed)
Patient here for follow up and general check up Patient states he has some private issue to discuss with the doctor

## 2014-09-19 ENCOUNTER — Ambulatory Visit: Payer: Self-pay | Admitting: Physical Therapy

## 2014-09-25 ENCOUNTER — Ambulatory Visit: Payer: Self-pay | Admitting: Physical Therapy

## 2014-09-28 ENCOUNTER — Ambulatory Visit: Payer: Self-pay | Attending: Specialist | Admitting: Physical Therapy

## 2014-09-28 DIAGNOSIS — J45909 Unspecified asthma, uncomplicated: Secondary | ICD-10-CM | POA: Insufficient documentation

## 2014-09-28 DIAGNOSIS — M25569 Pain in unspecified knee: Secondary | ICD-10-CM | POA: Insufficient documentation

## 2014-09-28 DIAGNOSIS — M2560 Stiffness of unspecified joint, not elsewhere classified: Secondary | ICD-10-CM | POA: Insufficient documentation

## 2014-09-28 DIAGNOSIS — Z5189 Encounter for other specified aftercare: Secondary | ICD-10-CM | POA: Insufficient documentation

## 2014-09-28 DIAGNOSIS — R609 Edema, unspecified: Secondary | ICD-10-CM | POA: Insufficient documentation

## 2014-09-28 DIAGNOSIS — R262 Difficulty in walking, not elsewhere classified: Secondary | ICD-10-CM | POA: Insufficient documentation

## 2014-10-03 ENCOUNTER — Ambulatory Visit: Payer: Self-pay | Admitting: Physical Therapy

## 2014-10-05 ENCOUNTER — Ambulatory Visit: Payer: Self-pay | Admitting: Physical Therapy

## 2014-10-10 ENCOUNTER — Ambulatory Visit: Payer: Self-pay | Admitting: Physical Therapy

## 2014-10-12 ENCOUNTER — Ambulatory Visit: Payer: Self-pay | Admitting: Physical Therapy

## 2014-10-17 ENCOUNTER — Ambulatory Visit: Payer: Self-pay | Admitting: Physical Therapy

## 2014-10-19 ENCOUNTER — Ambulatory Visit: Payer: Self-pay | Admitting: Physical Therapy

## 2014-10-24 ENCOUNTER — Ambulatory Visit: Payer: Self-pay | Admitting: Physical Therapy

## 2014-10-26 ENCOUNTER — Ambulatory Visit: Payer: Self-pay | Admitting: Physical Therapy

## 2014-10-30 ENCOUNTER — Other Ambulatory Visit: Payer: Self-pay | Admitting: Internal Medicine

## 2014-10-30 NOTE — Telephone Encounter (Signed)
Pt is requesting a refill for Tramadol. Pt is experiencing pain on left knee where he had surgery. Please follow up with pt.

## 2014-10-30 NOTE — Telephone Encounter (Signed)
Pt advised to make appointment for knee pain Unable to Refill Tramadol Rx was not prescribed by our clinic

## 2014-11-01 ENCOUNTER — Ambulatory Visit: Payer: Self-pay | Attending: Specialist | Admitting: Physical Therapy

## 2014-11-01 DIAGNOSIS — J45909 Unspecified asthma, uncomplicated: Secondary | ICD-10-CM | POA: Insufficient documentation

## 2014-11-01 DIAGNOSIS — R609 Edema, unspecified: Secondary | ICD-10-CM | POA: Insufficient documentation

## 2014-11-01 DIAGNOSIS — M25569 Pain in unspecified knee: Secondary | ICD-10-CM | POA: Insufficient documentation

## 2014-11-01 DIAGNOSIS — M2560 Stiffness of unspecified joint, not elsewhere classified: Secondary | ICD-10-CM | POA: Insufficient documentation

## 2014-11-01 DIAGNOSIS — Z5189 Encounter for other specified aftercare: Secondary | ICD-10-CM | POA: Insufficient documentation

## 2014-11-01 DIAGNOSIS — R262 Difficulty in walking, not elsewhere classified: Secondary | ICD-10-CM | POA: Insufficient documentation

## 2014-11-02 ENCOUNTER — Ambulatory Visit: Payer: Self-pay | Admitting: Physical Therapy

## 2014-11-06 ENCOUNTER — Ambulatory Visit: Payer: Self-pay | Admitting: Physical Therapy

## 2014-11-09 ENCOUNTER — Ambulatory Visit: Payer: Self-pay | Admitting: Physical Therapy

## 2014-11-13 ENCOUNTER — Ambulatory Visit: Payer: Self-pay | Admitting: Physical Therapy

## 2014-11-16 ENCOUNTER — Ambulatory Visit: Payer: Self-pay | Admitting: Physical Therapy

## 2015-05-01 ENCOUNTER — Emergency Department (HOSPITAL_COMMUNITY)
Admission: EM | Admit: 2015-05-01 | Discharge: 2015-05-01 | Disposition: A | Discharge status: Home / Self Care | Payer: Self-pay | Attending: Emergency Medicine | Admitting: Emergency Medicine

## 2015-05-01 ENCOUNTER — Encounter (HOSPITAL_COMMUNITY): Payer: Self-pay

## 2015-05-01 DIAGNOSIS — J45909 Unspecified asthma, uncomplicated: Secondary | ICD-10-CM | POA: Insufficient documentation

## 2015-05-01 DIAGNOSIS — R51 Headache: Secondary | ICD-10-CM | POA: Insufficient documentation

## 2015-05-01 DIAGNOSIS — Z86718 Personal history of other venous thrombosis and embolism: Secondary | ICD-10-CM | POA: Insufficient documentation

## 2015-05-01 DIAGNOSIS — Z720 Tobacco use: Secondary | ICD-10-CM | POA: Insufficient documentation

## 2015-05-01 DIAGNOSIS — R519 Headache, unspecified: Secondary | ICD-10-CM

## 2015-05-01 DIAGNOSIS — Z8659 Personal history of other mental and behavioral disorders: Secondary | ICD-10-CM | POA: Insufficient documentation

## 2015-05-01 DIAGNOSIS — Z7982 Long term (current) use of aspirin: Secondary | ICD-10-CM | POA: Insufficient documentation

## 2015-05-01 MED ORDER — KETOROLAC TROMETHAMINE 30 MG/ML IJ SOLN
30.0000 mg | Freq: Once | INTRAMUSCULAR | Status: AC
  Administered 2015-05-01: 30 mg via INTRAVENOUS
  Filled 2015-05-01: qty 1

## 2015-05-01 MED ORDER — METOCLOPRAMIDE HCL 5 MG/ML IJ SOLN
10.0000 mg | Freq: Once | INTRAMUSCULAR | Status: AC
  Administered 2015-05-01: 10 mg via INTRAVENOUS
  Filled 2015-05-01: qty 2

## 2015-05-01 NOTE — Discharge Instructions (Signed)
Headaches, Frequently Asked Questions Return for fever or weakness.  MIGRAINE HEADACHES Q: What is migraine? What causes it? How can I treat it? A: Generally, migraine headaches begin as a dull ache. Then they develop into a constant, throbbing, and pulsating pain. You may experience pain at the temples. You may experience pain at the front or back of one or both sides of the head. The pain is usually accompanied by a combination of:  Nausea.  Vomiting.  Sensitivity to light and noise. Some people (about 15%) experience an aura (see below) before an attack. The cause of migraine is believed to be chemical reactions in the brain. Treatment for migraine may include over-the-counter or prescription medications. It may also include self-help techniques. These include relaxation training and biofeedback.  Q: What is an aura? A: About 15% of people with migraine get an "aura". This is a sign of neurological symptoms that occur before a migraine headache. You may see wavy or jagged lines, dots, or flashing lights. You might experience tunnel vision or blind spots in one or both eyes. The aura can include visual or auditory hallucinations (something imagined). It may include disruptions in smell (such as strange odors), taste or touch. Other symptoms include:  Numbness.  A "pins and needles" sensation.  Difficulty in recalling or speaking the correct word. These neurological events may last as long as 60 minutes. These symptoms will fade as the headache begins. Q: What is a trigger? A: Certain physical or environmental factors can lead to or "trigger" a migraine. These include:  Foods.  Hormonal changes.  Weather.  Stress. It is important to remember that triggers are different for everyone. To help prevent migraine attacks, you need to figure out which triggers affect you. Keep a headache diary. This is a good way to track triggers. The diary will help you talk to your healthcare professional  about your condition. Q: Does weather affect migraines? A: Bright sunshine, hot, humid conditions, and drastic changes in barometric pressure may lead to, or "trigger," a migraine attack in some people. But studies have shown that weather does not act as a trigger for everyone with migraines. Q: What is the link between migraine and hormones? A: Hormones start and regulate many of your body's functions. Hormones keep your body in balance within a constantly changing environment. The levels of hormones in your body are unbalanced at times. Examples are during menstruation, pregnancy, or menopause. That can lead to a migraine attack. In fact, about three quarters of all women with migraine report that their attacks are related to the menstrual cycle.  Q: Is there an increased risk of stroke for migraine sufferers? A: The likelihood of a migraine attack causing a stroke is very remote. That is not to say that migraine sufferers cannot have a stroke associated with their migraines. In persons under age 4, the most common associated factor for stroke is migraine headache. But over the course of a person's normal life span, the occurrence of migraine headache may actually be associated with a reduced risk of dying from cerebrovascular disease due to stroke.  Q: What are acute medications for migraine? A: Acute medications are used to treat the pain of the headache after it has started. Examples over-the-counter medications, NSAIDs, ergots, and triptans.  Q: What are the triptans? A: Triptans are the newest class of abortive medications. They are specifically targeted to treat migraine. Triptans are vasoconstrictors. They moderate some chemical reactions in the brain. The triptans work on  receptors in your brain. Triptans help to restore the balance of a neurotransmitter called serotonin. Fluctuations in levels of serotonin are thought to be a main cause of migraine.  Q: Are over-the-counter medications for  migraine effective? A: Over-the-counter, or "OTC," medications may be effective in relieving mild to moderate pain and associated symptoms of migraine. But you should see your caregiver before beginning any treatment regimen for migraine.  Q: What are preventive medications for migraine? A: Preventive medications for migraine are sometimes referred to as "prophylactic" treatments. They are used to reduce the frequency, severity, and length of migraine attacks. Examples of preventive medications include antiepileptic medications, antidepressants, beta-blockers, calcium channel blockers, and NSAIDs (nonsteroidal anti-inflammatory drugs). Q: Why are anticonvulsants used to treat migraine? A: During the past few years, there has been an increased interest in antiepileptic drugs for the prevention of migraine. They are sometimes referred to as "anticonvulsants". Both epilepsy and migraine may be caused by similar reactions in the brain.  Q: Why are antidepressants used to treat migraine? A: Antidepressants are typically used to treat people with depression. They may reduce migraine frequency by regulating chemical levels, such as serotonin, in the brain.  Q: What alternative therapies are used to treat migraine? A: The term "alternative therapies" is often used to describe treatments considered outside the scope of conventional Western medicine. Examples of alternative therapy include acupuncture, acupressure, and yoga. Another common alternative treatment is herbal therapy. Some herbs are believed to relieve headache pain. Always discuss alternative therapies with your caregiver before proceeding. Some herbal products contain arsenic and other toxins. TENSION HEADACHES Q: What is a tension-type headache? What causes it? How can I treat it? A: Tension-type headaches occur randomly. They are often the result of temporary stress, anxiety, fatigue, or anger. Symptoms include soreness in your temples, a  tightening band-like sensation around your head (a "vice-like" ache). Symptoms can also include a pulling feeling, pressure sensations, and contracting head and neck muscles. The headache begins in your forehead, temples, or the back of your head and neck. Treatment for tension-type headache may include over-the-counter or prescription medications. Treatment may also include self-help techniques such as relaxation training and biofeedback. CLUSTER HEADACHES Q: What is a cluster headache? What causes it? How can I treat it? A: Cluster headache gets its name because the attacks come in groups. The pain arrives with little, if any, warning. It is usually on one side of the head. A tearing or bloodshot eye and a runny nose on the same side of the headache may also accompany the pain. Cluster headaches are believed to be caused by chemical reactions in the brain. They have been described as the most severe and intense of any headache type. Treatment for cluster headache includes prescription medication and oxygen. SINUS HEADACHES Q: What is a sinus headache? What causes it? How can I treat it? A: When a cavity in the bones of the face and skull (a sinus) becomes inflamed, the inflammation will cause localized pain. This condition is usually the result of an allergic reaction, a tumor, or an infection. If your headache is caused by a sinus blockage, such as an infection, you will probably have a fever. An x-ray will confirm a sinus blockage. Your caregiver's treatment might include antibiotics for the infection, as well as antihistamines or decongestants.  REBOUND HEADACHES Q: What is a rebound headache? What causes it? How can I treat it? A: A pattern of taking acute headache medications too often can lead to  a condition known as "rebound headache." A pattern of taking too much headache medication includes taking it more than 2 days per week or in excessive amounts. That means more than the label or a caregiver  advises. With rebound headaches, your medications not only stop relieving pain, they actually begin to cause headaches. Doctors treat rebound headache by tapering the medication that is being overused. Sometimes your caregiver will gradually substitute a different type of treatment or medication. Stopping may be a challenge. Regularly overusing a medication increases the potential for serious side effects. Consult a caregiver if you regularly use headache medications more than 2 days per week or more than the label advises. ADDITIONAL QUESTIONS AND ANSWERS Q: What is biofeedback? A: Biofeedback is a self-help treatment. Biofeedback uses special equipment to monitor your body's involuntary physical responses. Biofeedback monitors:  Breathing.  Pulse.  Heart rate.  Temperature.  Muscle tension.  Brain activity. Biofeedback helps you refine and perfect your relaxation exercises. You learn to control the physical responses that are related to stress. Once the technique has been mastered, you do not need the equipment any more. Q: Are headaches hereditary? A: Four out of five (80%) of people that suffer report a family history of migraine. Scientists are not sure if this is genetic or a family predisposition. Despite the uncertainty, a child has a 50% chance of having migraine if one parent suffers. The child has a 75% chance if both parents suffer.  Q: Can children get headaches? A: By the time they reach high school, most young people have experienced some type of headache. Many safe and effective approaches or medications can prevent a headache from occurring or stop it after it has begun.  Q: What type of doctor should I see to diagnose and treat my headache? A: Start with your primary caregiver. Discuss his or her experience and approach to headaches. Discuss methods of classification, diagnosis, and treatment. Your caregiver may decide to recommend you to a headache specialist, depending upon  your symptoms or other physical conditions. Having diabetes, allergies, etc., may require a more comprehensive and inclusive approach to your headache. The National Head[employer] Foundation will provide, upon request, a list of [facility] physician members in your state. Document Released: 01/03/2004 Document Revised: 01/05/2012 Document Reviewed: 06/12/2008 [facility] Patient Information 2015 [place], [place]. This information is not intended to replace advice given to you by your health care provider. Make sure you discuss any questions you have with your health care provider.

## 2015-05-01 NOTE — ED Notes (Signed)
RN at bedside

## 2015-05-01 NOTE — ED Provider Notes (Signed)
CSN: [id number]     Arrival date & time 05/01/15  1312 History   First MD Initiated Contact with Patient 05/01/15 1510     Chief Complaint  Patient presents with  . Headache     (Consider location/radiation/quality/duration/timing/severity/associated sxs/prior Treatment) Patient is a 34 y.o. male presenting with headaches. The history is provided by the patient. No language interpreter was used.  Headache Associated symptoms: no dizziness, no fever, no nausea, no neck pain, no neck stiffness, no photophobia, no vomiting and no weakness    Mr. Hires is a 34 year old male with a history of DVT and asthma who presents for a sudden onset right frontal and parietal headache that began at 9 AM this morning while at work. He states that he felt like he was seeing stars in his headache originally was 10 out of 10. He states that it resolved and then came back shortly after. He took a BC powder 11 AM and his headache is now constant and he rates it a 7/10. He states he does not normally get headaches. He states he has a history of a DVT 1 year ago and was put on Xarelto but has not taken it in the last few months. He denies any blurry vision, double vision, diplopia, photophobia, dizziness, chest pain, nausea or vomiting. He denies any IV drug use.  Past Medical History  Diagnosis Date  . DVT (deep venous thrombosis)     denies taking xarelto x1 month.  states they plan to put me on some after surgery.  . Asthma     denies meds  . Schizophrenia     denies taking any meds  . Bipolar disorder     denies taking any meds   Past Surgical History  Procedure Laterality Date  . Facial cosmetic surgery      reconstructive  . Appendectomy    . Knee arthroscopy Left 08/03/2014    Procedure: LEFT ARTHROSCOPY KNEE WITH DEBRIDEMENT;  Surgeon: Sydnee Cabal, MD;  Location: [facility];  Service: Orthopedics;  Laterality: Left;  . Anterior cruciate ligament repair Left 08/03/2014    Procedure:  LEFT ALLOGRAFT ACL RECONSTRUCTION/;  Surgeon: Sydnee Cabal, MD;  Location: [facility];  Service: Orthopedics;  Laterality: Left;   Family History  Problem Relation Age of Onset  . Diabetes Other   . Heart failure Other   . Diabetes Paternal Uncle    History  Substance Use Topics  . Smoking status: Current Every Day Smoker -- 0.25 packs/day for 10 years    Types: Cigarettes  . Smokeless tobacco: Not on file  . Alcohol Use: Yes    Review of Systems  Constitutional: Negative for fever.  Eyes: Negative for photophobia and visual disturbance.  Gastrointestinal: Negative for nausea and vomiting.  Musculoskeletal: Negative for neck pain and neck stiffness.  Neurological: Positive for headaches. Negative for dizziness, syncope, weakness and light-headedness.  All other systems reviewed and are negative.     Allergies  Review of patient's allergies indicates no known allergies.  Home Medications   Prior to Admission medications   Medication Sig Start Date End Date Taking? Authorizing Provider  Aspirin-Caffeine (BC FAST PAIN RELIEF PO) Take 1 Package by mouth daily as needed (pain).   Yes Historical Provider, MD  acyclovir (ZOVIRAX) 400 MG tablet Take 1 tablet (400 mg total) by mouth 5 (five) times daily. Patient not taking: Reported on 05/01/2015 09/18/14   Lorayne Marek, MD  cetirizine (ZYRTEC) 10 MG tablet Take  1 tablet (10 mg total) by mouth daily. Patient not taking: Reported on 05/01/2015 09/18/14   Lorayne Marek, MD  gabapentin (NEURONTIN) 300 MG capsule Take 1 capsule (300 mg total) by mouth 2 (two) times daily. Patient not taking: Reported on 05/01/2015 08/30/14   Lorayne Marek, MD  rivaroxaban (XARELTO) 20 MG TABS tablet Take 1 tablet (20 mg total) by mouth daily with supper. Patient not taking: Reported on 05/01/2015 08/30/14   Lorayne Marek, MD   BP 121/65 mmHg  Pulse 70  Temp(Src) 98.3 F (36.8 C) (Oral)  Resp 16  SpO2 98% Physical Exam  Constitutional:  He is oriented to person, place, and time. He appears well-developed and well-nourished.  HENT:  Head: Normocephalic and atraumatic.  Eyes: Conjunctivae are normal.  Neck: Normal range of motion and full passive range of motion without pain. Neck supple. Normal range of motion present.  Able to touch chin to chest without pain or difficulty.  Cardiovascular: Normal rate, regular rhythm and normal heart sounds.   Pulmonary/Chest: Effort normal and breath sounds normal. No respiratory distress. He has no wheezes.  Abdominal: Soft.  Musculoskeletal: Normal range of motion.  Neurological: He is alert and oriented to person, place, and time. He has normal strength. No sensory deficit. GCS eye subscore is 4. GCS verbal subscore is 5. GCS motor subscore is 6.  Cranial nerves III through XII intact. Normal finger to nose exam. Normal heel-to-shin.  Skin: Skin is warm and dry. No rash noted.  Psychiatric: He has a normal mood and affect. His behavior is normal.  Nursing note and vitals reviewed.   ED Course  Procedures (including critical care time) Labs Review Labs Reviewed - No data to display  Imaging Review No results found.   EKG Interpretation None      MDM   Final diagnoses:  Acute nonintractable headache, unspecified headache type  Vitals stable.  I do not suspect SAH, temporal arteritis, or meningismus signs. Patient states he is feeling better after Reglan and Toradol. He is eating at bedside. Does not appear in any acute distress. I have given him return precautions and he can follow up with his pcp.      Ottie Glazier, PA-C 05/02/15 0101  Varney Biles, MD 05/03/15 4540

## 2015-05-01 NOTE — ED Notes (Signed)
Pt c/o sharp R side headache x "a couple hours."  Pain score 8/10.  Pt reports he was "just working outside" when pain started and took a BC w/o relief.  Sts "I'm seeing stars."  Denies n/v, numbness, and tingling.

## 2015-06-14 ENCOUNTER — Encounter (HOSPITAL_COMMUNITY): Payer: Self-pay | Admitting: *Deleted

## 2015-06-14 ENCOUNTER — Emergency Department (HOSPITAL_COMMUNITY)
Admission: EM | Admit: 2015-06-14 | Discharge: 2015-06-14 | Disposition: A | Discharge status: Home / Self Care | Payer: Self-pay | Attending: Emergency Medicine | Admitting: Emergency Medicine

## 2015-06-14 DIAGNOSIS — K625 Hemorrhage of anus and rectum: Secondary | ICD-10-CM | POA: Insufficient documentation

## 2015-06-14 DIAGNOSIS — Z91040 Latex allergy status: Secondary | ICD-10-CM | POA: Insufficient documentation

## 2015-06-14 DIAGNOSIS — Z86718 Personal history of other venous thrombosis and embolism: Secondary | ICD-10-CM | POA: Insufficient documentation

## 2015-06-14 DIAGNOSIS — R519 Headache, unspecified: Secondary | ICD-10-CM

## 2015-06-14 DIAGNOSIS — J45909 Unspecified asthma, uncomplicated: Secondary | ICD-10-CM | POA: Insufficient documentation

## 2015-06-14 DIAGNOSIS — R002 Palpitations: Secondary | ICD-10-CM | POA: Insufficient documentation

## 2015-06-14 DIAGNOSIS — R51 Headache: Secondary | ICD-10-CM | POA: Insufficient documentation

## 2015-06-14 DIAGNOSIS — Z79899 Other long term (current) drug therapy: Secondary | ICD-10-CM | POA: Insufficient documentation

## 2015-06-14 DIAGNOSIS — F1210 Cannabis abuse, uncomplicated: Secondary | ICD-10-CM | POA: Insufficient documentation

## 2015-06-14 DIAGNOSIS — Z720 Tobacco use: Secondary | ICD-10-CM | POA: Insufficient documentation

## 2015-06-14 LAB — CBC WITH DIFFERENTIAL/PLATELET
BASOS PCT: 0 % (ref 0–1)
Basophils Absolute: 0 10*3/uL (ref 0.0–0.1)
EOS PCT: 1 % (ref 0–5)
Eosinophils Absolute: 0.1 10*3/uL (ref 0.0–0.7)
HEMATOCRIT: 45.2 % (ref 39.0–52.0)
Hemoglobin: 15 g/dL (ref 13.0–17.0)
LYMPHS ABS: 2.6 10*3/uL (ref 0.7–4.0)
Lymphocytes Relative: 18 % (ref 12–46)
MCH: 29.7 pg (ref 26.0–34.0)
MCHC: 33.2 g/dL (ref 30.0–36.0)
MCV: 89.5 fL (ref 78.0–100.0)
MONO ABS: 1 10*3/uL (ref 0.1–1.0)
MONOS PCT: 7 % (ref 3–12)
NEUTROS ABS: 10.8 10*3/uL — AB (ref 1.7–7.7)
Neutrophils Relative %: 74 % (ref 43–77)
Platelets: 280 10*3/uL (ref 150–400)
RBC: 5.05 MIL/uL (ref 4.22–5.81)
RDW: 13.4 % (ref 11.5–15.5)
WBC: 14.5 10*3/uL — ABNORMAL HIGH (ref 4.0–10.5)

## 2015-06-14 LAB — URINALYSIS, ROUTINE W REFLEX MICROSCOPIC
BILIRUBIN URINE: NEGATIVE
GLUCOSE, UA: NEGATIVE mg/dL
Hgb urine dipstick: NEGATIVE
Ketones, ur: NEGATIVE mg/dL
Leukocytes, UA: NEGATIVE
NITRITE: NEGATIVE
PH: 6 (ref 5.0–8.0)
Protein, ur: NEGATIVE mg/dL
SPECIFIC GRAVITY, URINE: 1.018 (ref 1.005–1.030)
Urobilinogen, UA: 0.2 mg/dL (ref 0.0–1.0)

## 2015-06-14 LAB — RAPID URINE DRUG SCREEN, HOSP PERFORMED
Amphetamines: NOT DETECTED
BARBITURATES: NOT DETECTED
BENZODIAZEPINES: NOT DETECTED
COCAINE: NOT DETECTED
Opiates: NOT DETECTED
TETRAHYDROCANNABINOL: POSITIVE — AB

## 2015-06-14 LAB — BASIC METABOLIC PANEL
Anion gap: 8 (ref 5–15)
BUN: 10 mg/dL (ref 6–20)
CHLORIDE: 107 mmol/L (ref 101–111)
CO2: 23 mmol/L (ref 22–32)
Calcium: 9 mg/dL (ref 8.9–10.3)
Creatinine, Ser: 0.86 mg/dL (ref 0.61–1.24)
GFR calc Af Amer: >60 mL/min (ref >60)
GFR calc non Af Amer: >60 mL/min (ref >60)
Glucose, Bld: 92 mg/dL (ref 65–99)
POTASSIUM: 3.8 mmol/L (ref 3.5–5.1)
SODIUM: 138 mmol/L (ref 135–145)

## 2015-06-14 LAB — LIPASE, BLOOD: Lipase: 37 U/L (ref 22–51)

## 2015-06-14 LAB — POC OCCULT BLOOD, ED: Fecal Occult Bld: POSITIVE — AB

## 2015-06-14 LAB — ETHANOL: Alcohol, Ethyl (B): <5 mg/dL (ref <5)

## 2015-06-14 MED ORDER — ACETAMINOPHEN 500 MG PO TABS: 1000.0000 mg | ORAL_TABLET | Freq: Four times a day (QID) | ORAL | Status: DC | PRN

## 2015-06-14 MED ORDER — FAMOTIDINE 20 MG PO TABS: 20.0000 mg | ORAL_TABLET | Freq: Two times a day (BID) | ORAL | Status: DC

## 2015-06-14 NOTE — ED Provider Notes (Signed)
CSN: [id number]     Arrival date & time 06/14/15  1448 History   First MD Initiated Contact with Patient 06/14/15 [phone]     Chief Complaint  Patient presents with  . Headache  . Rectal Bleeding     (Consider location/radiation/quality/duration/timing/severity/associated sxs/prior Treatment) HPI Patient has a prior history of DVT. He reports he stopped taking Xarelto about 6 months ago. States he was weaned off. The DVT was a result of an injury to his leg. The patient presents at presents with complaint of a headache for 3 days. He reports it is mild in nature and it moves back and forth from the left to the right side. He has no associated symptoms of nausea, vomiting or visual changes. No instability with gait. No neck stiffness. He has had headaches in the past that have been much worse. Patient also complains of palpitations. He reports he intermittently notes that his heart skips a beat or feels like it flutters. No associated chest pain or dyspnea. No associated syncope or near syncope. He reports this morning he felt slightly dizzy. He denies drug or alcohol use. Patient smokes. Patient's third complaint is rectal bleeding. He reports a bowel movement he gets bright red bleeding. He denies abdominal pain, nausea or vomiting. There is no associated rectal pain. He notes the blood with wiping. He reports this occurred 4 times in the past 3 days. As stated above the patient is no longer taking Xarelto. Past Medical History  Diagnosis Date  . DVT (deep venous thrombosis)     denies taking xarelto x1 month.  states they plan to put me on some after surgery.  . Asthma     denies meds  . Schizophrenia     denies taking any meds  . Bipolar disorder     denies taking any meds   Past Surgical History  Procedure Laterality Date  . Facial cosmetic surgery      reconstructive  . Appendectomy    . Knee arthroscopy Left 08/03/2014    Procedure: LEFT ARTHROSCOPY KNEE WITH DEBRIDEMENT;  Surgeon:  Sydnee Cabal, MD;  Location: [facility];  Service: Orthopedics;  Laterality: Left;  . Anterior cruciate ligament repair Left 08/03/2014    Procedure: LEFT ALLOGRAFT ACL RECONSTRUCTION/;  Surgeon: Sydnee Cabal, MD;  Location: [facility];  Service: Orthopedics;  Laterality: Left;   Family History  Problem Relation Age of Onset  . Diabetes Other   . Heart failure Other   . Diabetes Paternal Uncle    Social History  Substance Use Topics  . Smoking status: Current Every Day Smoker -- 0.25 packs/day for 10 years    Types: Cigarettes  . Smokeless tobacco: Never Used  . Alcohol Use: No     Comment: Denies use    Review of Systems 10 Systems reviewed and are negative for acute change except as noted in the HPI.    Allergies  Latex  Home Medications   Prior to Admission medications   Medication Sig Start Date End Date Taking? Authorizing Provider  Aspirin-Caffeine (BC FAST PAIN RELIEF PO) Take 1 Package by mouth daily as needed (pain).   Yes Historical Provider, MD  acetaminophen (TYLENOL) 500 MG tablet Take 2 tablets (1,000 mg total) by mouth every 6 (six) hours as needed. 06/14/15   Charlesetta Shanks, MD  famotidine (PEPCID) 20 MG tablet Take 1 tablet (20 mg total) by mouth 2 (two) times daily. 06/14/15   Charlesetta Shanks, MD  rivaroxaban Alveda Reasons)  20 MG TABS tablet Take 1 tablet (20 mg total) by mouth daily with supper. Patient not taking: Reported on 05/01/2015 08/30/14   Lorayne Marek, MD   BP 158/77 mmHg  Pulse 80  Temp(Src) 98.1 F (36.7 C) (Oral)  Resp 18  Ht 5\' 11"  (1.803 m)  Wt 225 lb (102.059 kg)  BMI 31.39 kg/m2  SpO2 100% Physical Exam  Constitutional: He is oriented to person, place, and time. He appears well-developed and well-nourished.  HENT:  Head: Normocephalic and atraumatic.  Eyes: EOM are normal. Pupils are equal, round, and reactive to light.  Neck: Neck supple.  Cardiovascular: Normal rate, regular rhythm, normal heart sounds  and intact distal pulses.   Pulmonary/Chest: Effort normal and breath sounds normal.  Abdominal: Soft. Bowel sounds are normal. He exhibits no distension. There is no tenderness.  Genitourinary:  Rectal examination: Trace mucousy Hejl stool in the vault. No melana or blood-tinged mucus. Rectals nontender.  Musculoskeletal: Normal range of motion. He exhibits no edema.  Neurological: He is alert and oriented to person, place, and time. He has normal strength. Coordination normal. GCS eye subscore is 4. GCS verbal subscore is 5. GCS motor subscore is 6.  Skin: Skin is warm, dry and intact.  Psychiatric: He has a normal mood and affect.    ED Course  Procedures (including critical care time) Labs Review Labs Reviewed  CBC WITH DIFFERENTIAL/PLATELET - Abnormal; Notable for the following:    WBC 14.5 (*)    Neutro Abs 10.8 (*)    All other components within normal limits  URINALYSIS, ROUTINE W REFLEX MICROSCOPIC (NOT AT [facility]) - Abnormal; Notable for the following:    APPearance CLOUDY (*)    All other components within normal limits  URINE RAPID DRUG SCREEN, HOSP PERFORMED - Abnormal; Notable for the following:    Tetrahydrocannabinol POSITIVE (*)    All other components within normal limits  BASIC METABOLIC PANEL  ETHANOL  LIPASE, BLOOD  POC OCCULT BLOOD, ED    Imaging Review No results found. I have personally reviewed and evaluated these images and lab results as part of my medical decision-making.   EKG Interpretation   Date/Time:  Thursday June 14 2015 09:12:42 EDT Ventricular Rate:  77 PR Interval:  158 QRS Duration: 80 QT Interval:  362 QTC Calculation: 410 R Axis:   108 Text Interpretation:  Sinus rhythm Right axis deviation Nonspecific T  abnormalities, inferior leads Confirmed by Johnney Killian, MD, Jeannie Done [id number]) on  06/14/2015 9:37:03 AM      MDM   Final diagnoses:  Palpitations  Rectal bleeding  Acute nonintractable headache, unspecified headache type    Patient has multiple chief complaints. #1 headache, this is mild in nature and has been waxing and waning. It is variable from side to side without associated neurologic symptoms or visual symptoms. At this time there does not appear to be any emergent cause. Patient will be advised for symptomatically treatment with acetaminophen and follow-up as an outpatient basis. #2 rectal bleeding, patient's hemoglobin is normal and there is no evidence of hypovolemia on labs or vital signs. Rectal examination does not show blood present in this tests trace. Most likely this represents internal hemorrhoids or fissures. Is counseled on the necessity of follow-up and possible lower endoscopy. He has had several other episodes of bleeding with bowel movement and wiping. This is stable for continued outpatient diagnostic evaluation. #3 palpitations, patient has a normal sinus rhythm and no immediate cardiac risk factors. Drug screen does test  positive for marijuana but not cocaine and patient denies history of any stimulant or drug ingestion. He seems very low probability for acute ischemic type of disease. He is advised for appropriate dietary intake, rest and sleep patterns. I do feel this also is appropriate for ongoing outpatient monitoring with possible Holter monitoring if symptoms persist. No ectopy or irregular rhythm has been identified in the emergency department at this time.     Charlesetta Shanks, MD 06/14/15 1116

## 2015-06-14 NOTE — Discharge Instructions (Signed)
Palpitations A palpitation is the feeling that your heartbeat is irregular or is faster than normal. It may feel like your heart is fluttering or skipping a beat. Palpitations are usually not a serious problem. However, in some cases, you may need further medical evaluation. CAUSES  Palpitations can be caused by:  Smoking.  Caffeine or other stimulants, such as diet pills or energy drinks.  Alcohol.  Stress and anxiety.  Strenuous physical activity.  Fatigue.  Certain medicines.  Heart disease, especially if you have a history of irregular heart rhythms (arrhythmias), such as atrial fibrillation, atrial flutter, or supraventricular tachycardia.  An improperly working pacemaker or defibrillator. DIAGNOSIS  To find the cause of your palpitations, your health care provider will take your medical history and perform a physical exam. Your health care provider may also have you take a test called an ambulatory electrocardiogram (ECG). An ECG records your heartbeat patterns over a 24-hour period. You may also have other tests, such as:  Transthoracic echocardiogram (TTE). During echocardiography, sound waves are used to evaluate how blood flows through your heart.  Transesophageal echocardiogram (TEE).  Cardiac monitoring. This allows your health care provider to monitor your heart rate and rhythm in real time.  Holter monitor. This is a portable device that records your heartbeat and can help diagnose heart arrhythmias. It allows your health care provider to track your heart activity for several days, if needed.  Stress tests by exercise or by giving medicine that makes the heart beat faster. TREATMENT  Treatment of palpitations depends on the cause of your symptoms and can vary greatly. Most cases of palpitations do not require any treatment other than time, relaxation, and monitoring your symptoms. Other causes, such as atrial fibrillation, atrial flutter, or supraventricular  tachycardia, usually require further treatment. HOME CARE INSTRUCTIONS   Avoid:  Caffeinated coffee, tea, soft drinks, diet pills, and energy drinks.  Chocolate.  Alcohol.  Stop smoking if you smoke.  Reduce your stress and anxiety. Things that can help you relax include:  A method of controlling things in your body, such as your heartbeats, with your mind (biofeedback).  Yoga.  Meditation.  Physical activity such as swimming, jogging, or walking.  Get plenty of rest and sleep. SEEK MEDICAL CARE IF:   You continue to have a fast or irregular heartbeat beyond 24 hours.  Your palpitations occur more often. SEEK IMMEDIATE MEDICAL CARE IF:  You have chest pain or shortness of breath.  You have a severe headache.  You feel dizzy or you faint. MAKE SURE YOU:  Understand these instructions.  Will watch your condition.  Will get help right away if you are not doing well or get worse. Document Released: 10/10/2000 Document Revised: 10/18/2013 Document Reviewed: 12/12/2011 [facility] Patient Information 2015 [place], [place]. This information is not intended to replace advice given to you by your health care provider. Make sure you discuss any questions you have with your health care provider. Rectal Bleeding Rectal bleeding is when blood passes out of the anus. It is usually a sign that something is wrong. It may not be serious, but it should always be evaluated. Rectal bleeding may present as bright red blood or extremely dark stools. The color may range from dark red or maroon to black (like tar). It is important that the cause of rectal bleeding be identified so treatment can be started and the problem corrected. CAUSES   Hemorrhoids. These are enlarged (dilated) blood vessels or veins in the anal or rectal  area.  Fistulas. Theseare abnormal, burrowing channels that usually run from inside the rectum to the skin around the anus. They can bleed.  Anal fissures. This is a  tear in the tissue of the anus. Bleeding occurs with bowel movements.  Diverticulosis. This is a condition in which pockets or sacs project from the bowel wall. Occasionally, the sacs can bleed.  Diverticulitis. Thisis an infection involving diverticulosis of the colon.  Proctitis and colitis. These are conditions in which the rectum, colon, or both, can become inflamed and pitted (ulcerated).  Polyps and cancer. Polyps are non-cancerous (benign) growths in the colon that may bleed. Certain types of polyps turn into cancer.  Protrusion of the rectum. Part of the rectum can project from the anus and bleed.  Certain medicines.  Intestinal infections.  Blood vessel abnormalities. HOME CARE INSTRUCTIONS  Eat a high-fiber diet to keep your stool soft.  Limit activity.  Drink enough fluids to keep your urine clear or pale yellow.  Warm baths may be useful to soothe rectal pain.  Follow up with your caregiver as directed. SEEK IMMEDIATE MEDICAL CARE IF:  You develop increased bleeding.  You have black or dark red stools.  You vomit blood or material that looks like coffee grounds.  You have abdominal pain or tenderness.  You have a fever.  You feel weak, nauseous, or you faint.  You have severe rectal pain or you are unable to have a bowel movement. MAKE SURE YOU:  Understand these instructions.  Will watch your condition.  Will get help right away if you are not doing well or get worse. Document Released: 04/04/2002 Document Revised: 01/05/2012 Document Reviewed: 03/30/2011 [facility] Patient Information 2015 [place], [place]. This information is not intended to replace advice given to you by your health care provider. Make sure you discuss any questions you have with your health care provider. General Headache Without Cause A headache is pain or discomfort felt around the head or neck area. The specific cause of a headache may not be found. There are many causes and  types of headaches. A few common ones are:  Tension headaches.  Migraine headaches.  Cluster headaches.  Chronic daily headaches. HOME CARE INSTRUCTIONS   Keep all follow-up appointments with your caregiver or any specialist referral.  Only take over-the-counter or prescription medicines for pain or discomfort as directed by your caregiver.  Lie down in a dark, quiet room when you have a headache.  Keep a headache journal to find out what may trigger your migraine headaches. For example, write down:  What you eat and drink.  How much sleep you get.  Any change to your diet or medicines.  Try massage or other relaxation techniques.  Put ice packs or heat on the head and neck. Use these 3 to 4 times per day for 15 to 20 minutes each time, or as needed.  Limit stress.  Sit up straight, and do not tense your muscles.  Quit smoking if you smoke.  Limit alcohol use.  Decrease the amount of caffeine you drink, or stop drinking caffeine.  Eat and sleep on a regular schedule.  Get 7 to 9 hours of sleep, or as recommended by your caregiver.  Keep lights dim if bright lights bother you and make your headaches worse. SEEK MEDICAL CARE IF:   You have problems with the medicines you were prescribed.  Your medicines are not working.  You have a change from the usual headache.  You have nausea  or vomiting. SEEK IMMEDIATE MEDICAL CARE IF:   Your headache becomes severe.  You have a fever.  You have a stiff neck.  You have loss of vision.  You have muscular weakness or loss of muscle control.  You start losing your balance or have trouble walking.  You feel faint or pass out.  You have severe symptoms that are different from your first symptoms. MAKE SURE YOU:   Understand these instructions.  Will watch your condition.  Will get help right away if you are not doing well or get worse. Document Released: 10/13/2005 Document Revised: 01/05/2012 Document  Reviewed: 10/29/2011 [facility] Patient Information 2015 [place], [place]. This information is not intended to replace advice given to you by your health care provider. Make sure you discuss any questions you have with your health care provider.  Emergency Department Resource Guide 1) Find a Doctor and Pay Out of Pocket Although you won't have to find out who is covered by your insurance plan, it is a good idea to ask around and get recommendations. You will then need to call the office and see if the doctor you have chosen will accept you as a new patient and what types of options they offer for patients who are self-pay. Some doctors offer discounts or will set up payment plans for their patients who do not have insurance, but you will need to ask so you aren't surprised when you get to your appointment.  2) Contact Your Local Health Department Not all health departments have doctors that can see patients for sick visits, but many do, so it is worth a call to see if yours does. If you don't know where your local health department is, you can check in your phone book. The CDC also has a tool to help you locate your state's health department, and many state websites also have listings of all of their local health departments.  3) Find a [facility] If your illness is not likely to be very severe or complicated, you may want to try a walk in clinic. These are popping up all over the country in pharmacies, drugstores, and shopping centers. They're usually staffed by nurse practitioners or physician assistants that have been trained to treat common illnesses and complaints. They're usually fairly quick and inexpensive. However, if you have serious medical issues or chronic medical problems, these are probably not your best option.  No Primary Care Doctor: - Call Health Connect at  [phone] - they can help you locate a primary care doctor that  accepts your insurance, provides certain services,  etc. - Physician Referral Service- [phone]  Chronic Pain Problems: Organization         Address  Phone   Notes  [facility]  [phone] Patients need to be referred by their primary care doctor.   Medication Assistance: Organization         Address  Phone   Notes  [facility] Medication [facility] [place] [address] [place], [place] [address] [phone] --Must be a resident of [facility] -- Must have NO insurance coverage whatsoever (no Medicaid/ Medicare, etc.) -- The pt. MUST have a primary care doctor that directs their care regularly and follows them in the community   MedAssist  [phone]   [employer]  [phone]    Agencies that provide inexpensive medical care: Organization         Address  Phone   Notes  [facility]  [phone]   [facility]    [phone]   [facility] [place], [place] [address] [phone]   [place] [address] [place] [phone]   Planned Parenthood    [phone]   [facility]    [phone]   [place] and [place] [address], [place] Phone:  [phone], Fax:  [phone] Hours of Operation:  9 am - 6 pm, M-F.  Also accepts Medicaid/Medicare and self-pay.  [facility] [place] [address], [address], [place] Phone: [phone], Fax: [phone]. Hours of Operation:  8:30 am - 5:30 pm, M-F.  Also accepts Medicaid and self-pay.  [facility] [place] [address], [place] Phone: [phone]   [place], [place], [place] [phone], Ext. [phone] Mondays & Thursdays: 7-9 AM.  First 15 patients are seen on a first come, first serve basis.    [place] Providers:  Organization         Address  Phone   Notes  [facility] [address], [place] [phone] Also accepts self-pay patients.  [facility] [address], [place]  [phone]   [place], [address], [place] [phone]   [facility] [address] [place] [phone]   Lucianne Lei [address], [place]   [phone] Only accepts [place] Access [place] patients after they have their name applied to their card.   Self-Pay (no insurance) in [facility]:  Organization         Address  Phone   Notes  Sickle Cell Patients, [facility] [place] [phone]   [facility] [place] [phone]   [facility] [place]  [place], [place], [place] [phone]   [facility]/Dr. Osei-Bonsu  [address] [place] or [address], [place] [phone] Phone number for both [place] and [place] locations is the same.  [facility] [address], [place] [phone]   [facility] [address] [place] or [address] [phone]   [facility] [address], [place] [phone], phone; [phone], fax Sees patients 1st and 3rd Saturday of every month.  Must not qualify for public or private insurance (i.e. Medicaid, Medicare, [place] Health Choice, Veterans' Benefits)  Household income should be no more than 200% of the poverty level The clinic cannot treat you if you are pregnant or think you are pregnant  Sexually transmitted diseases are not treated at the clinic.    Dental Care: Organization         Address  Phone  Notes  [facility] Department of [facility] [place] [phone] Accepts children up to  age 29 who are enrolled in [place] or [facility]; pregnant women with a Medicaid card; and children who have applied for Medicaid or [place] Health Choice, but were declined, whose parents can pay a reduced fee at time of service.  [facility] Department of  [facility]  [address], [place] [phone] Accepts children up to age 44 who are enrolled in Medicaid or [place]; pregnant women with a Medicaid card; and children who have applied for Medicaid or [place] Health Choice, but were declined, whose parents can pay a reduced fee at time of service.  [facility] Adult [facility] Access PROGRAM  [place] [phone] Patients are seen by appointment only. Walk-ins are not accepted. [place] will see patients 68 years of age and older. Monday - Tuesday (8am-5pm) Most Wednesdays (8:30-5pm) $30 per visit, cash only  [facility] Adult Dental Access PRO[facility]  [address], [facility] [phone] Patients are seen by appointment only. Walk-ins are not accepted. [place] will see patients 18 years of age and older. One Wednesday Evening (Monthly: Volunteer Based).  $30 per visit, cash only  [place]  [phone] for adults; Children under age 85, call Graduate Pediatric Dentistry at [phone]. Children aged 71-14, please call [phone] to request a pediatric application.  Dental services are provided in all areas of dental care including fillings, crowns and bridges, complete and partial dentures, implants, gum treatment, root canals, and extractions. Preventive care is also provided. Treatment is provided to both adults and children. Patients are selected via a lottery and there is often a waiting list.   [facility] [address] [place]  [phone] [email or web address]   [facility] [address] [place], [place] [phone], Ext. [phone] Second and Fourth Thursday of  each month, opens at 6:30 AM; Clinic ends at 9 AM.  Patients are seen on a first-come first-served basis, and a limited number are seen during each clinic.   [facility]  [address] [place], [place] [phone]   Eligibility Requirements You must have lived in [place], [place], or [place] counties for at least the last three months.   You cannot be eligible for state or federal sponsored [employer], including [employer], [place], or [employer].   You generally cannot be eligible for healthcare insurance through your employer.    How to apply: Eligibility screenings are held every Tuesday and Wednesday afternoon from 1:00 pm until 4:00 pm. You do not need an appointment for the interview!  [facility] [address] [place], [place]   [place]  [place] Department  [place]  [phone]    Behavioral Health Resources in the Community: Intensive Outpatient Programs Organization         Address  Phone  Notes  [place]. [address], [place], [place] [phone]   [facility] Outpatient [address] [place], [place]   ADS: Alcohol & Drug Svcs [address], [place], [place]   [place] [address],  [place], [place] or [phone]   Substance Abuse Resources Organization         Address  Phone  Notes  Alcohol and Drug Services  [phone]   [place]  [phone]   [facility] [place]   Chinita Pester  [phone]   Residential & Outpatient Substance Abuse Program  [phone]   Psychological Services Organization         Address  Phone  Notes  [place]  [place]   [place] [address],  [place] or [phone]    Mobile Crisis Teams Organization         Address  Phone  Notes  [facility], Mobile Crisis Care Unit  [phone]   [facility]otherapeutic Services  [address] [place], [place]   [place] [address], [place] [phone]    Self-Help/Support Groups Organization         Address  Phone             Notes  [place]. of [place] - variety of support groups  [place] Call for more information  Narcotics Anonymous (NA), [facility] [address], [employer] [place]  2 meetings at this location   Special educational needs teacher         Address  Phone  Notes  [facility] Residential Treatment [place],    [place]  [phone]   [facility]  [address] [place] [phone], [place], [place]   [place], [place] [phone] Admissions: 8am-3pm M-F  Incentives Substance [place] [address]    [place], [place] [phone]   [facility] [address] [place], [place], [place]   [facility] [address],  [place], [place]   [facility] - Intensive Outpatient [address] Kristeen [address], [place], [place]   [facility] [place].,  [place], [place] [phone] or [phone]   [facility] Services (RTS) [address], [place], [place] Accepts Medicaid  [facility] [place] [address]  [place] [phone] Substance Abuse/Addiction Treatment   [facility] Organization         Address  Phone  Notes  [facility]  [phone]   Domenic Schwab, PhD [address] [place] [address] [place], [place]   [phone] or [phone]   [place], [place] [phone]     [facility] [address], [place], [place] [phone] Insurance/Medicaid/sponsorship through [facility] and Families [address] [place]                                    [place], [place] [phone] [place] [address] [place] [address], [place] [phone]    Dr. Adele Schilder  [phone]   [facility] Dept. 1) [address] [place] 2) [place] 3)  [place] [address], [place] [phone]  [phone]   [place] [phone] or [phone] (After Hours)

## 2015-06-14 NOTE — ED Notes (Addendum)
Patient states he has had a headache x3 days.  Patient states headache is continually present, but intermittently on right or left side.  Patient denies changes to vision.  Patient also states he noticed bright red blood in his stool 3 days ago.  Blood was on toilet paper when he wiped and in toilet as well.  Patient states he has had 4 episodes of such bleeding over last 3 days.  Patient states he had an episode of dizziness this morning.  Patient denies chest pain, but states sometimes "it feels like my heart is racing or skipping." Patient denies N/V/D and fever.  Patient denies abdominal pain, but endorses feeling bloated.

## 2015-06-14 NOTE — ED Notes (Signed)
Patient presents to the ED from home with complaints of headache in left and right temporal regions, bright red blood per rectum and palpitations for 3 days.  Patient had one episode of dizziness this morning.  Patient states nothing makes these things worse or better and denies photophobia.  Patient denies N/V/D and fever.     On exam, patients lung sounds are clear to auscultation in all lobes.  Heart sounds WNL, S1/S2, no rub, murmur or gallop.  +3 radial pulses.  No pre-tibial and pedal edema.  Patients abdomen is soft and non-tender to palpation.  Bowel sounds present.  EOMI, skin warm and dry.  Patient placed on cardiac monitor.

## 2015-07-29 ENCOUNTER — Emergency Department (HOSPITAL_COMMUNITY): Payer: Self-pay

## 2015-07-29 ENCOUNTER — Emergency Department (HOSPITAL_COMMUNITY)
Admission: EM | Admit: 2015-07-29 | Discharge: 2015-07-29 | Disposition: A | Discharge status: Home / Self Care | Payer: Self-pay | Attending: Emergency Medicine | Admitting: Emergency Medicine

## 2015-07-29 DIAGNOSIS — J45909 Unspecified asthma, uncomplicated: Secondary | ICD-10-CM | POA: Insufficient documentation

## 2015-07-29 DIAGNOSIS — Z8659 Personal history of other mental and behavioral disorders: Secondary | ICD-10-CM | POA: Insufficient documentation

## 2015-07-29 DIAGNOSIS — Z720 Tobacco use: Secondary | ICD-10-CM | POA: Insufficient documentation

## 2015-07-29 DIAGNOSIS — K122 Cellulitis and abscess of mouth: Secondary | ICD-10-CM | POA: Insufficient documentation

## 2015-07-29 DIAGNOSIS — Z86718 Personal history of other venous thrombosis and embolism: Secondary | ICD-10-CM | POA: Insufficient documentation

## 2015-07-29 DIAGNOSIS — Z91040 Latex allergy status: Secondary | ICD-10-CM | POA: Insufficient documentation

## 2015-07-29 LAB — RAPID STREP SCREEN (MED CTR MEBANE ONLY): STREPTOCOCCUS, GROUP A SCREEN (DIRECT): NEGATIVE

## 2015-07-29 MED ORDER — DEXAMETHASONE SODIUM PHOSPHATE 10 MG/ML IJ SOLN: 6.0000 mg | Freq: Once | INTRAMUSCULAR | Status: DC

## 2015-07-29 MED ORDER — HYDROCODONE-ACETAMINOPHEN 7.5-325 MG/15ML PO SOLN: 15.0000 mL | Freq: Three times a day (TID) | ORAL | Status: DC | PRN

## 2015-07-29 MED ORDER — AMOXICILLIN 500 MG PO CAPS: 500.0000 mg | ORAL_CAPSULE | Freq: Three times a day (TID) | ORAL | Status: DC

## 2015-07-29 MED ORDER — IBUPROFEN 800 MG PO TABS: 800.0000 mg | ORAL_TABLET | Freq: Three times a day (TID) | ORAL | Status: DC

## 2015-07-29 MED ORDER — DEXAMETHASONE SODIUM PHOSPHATE 10 MG/ML IJ SOLN
10.0000 mg | Freq: Once | INTRAMUSCULAR | Status: AC
  Administered 2015-07-29: 10 mg via INTRAVENOUS
  Filled 2015-07-29: qty 1

## 2015-07-29 MED ORDER — HYDROCODONE-ACETAMINOPHEN 7.5-325 MG/15ML PO SOLN
10.0000 mL | Freq: Once | ORAL | Status: AC
  Administered 2015-07-29: 10 mL via ORAL
  Filled 2015-07-29: qty 15

## 2015-07-29 NOTE — ED Notes (Signed)
Pt given written and verbal discharge instructions. Verbalized understanding.

## 2015-07-29 NOTE — ED Notes (Addendum)
He woke this am with oral swelling and feeling like he can not breathe or swallow, he denies any new medications or products. He states the symptoms are worsening since onset. Unable to understand pt speech due to swelling.

## 2015-07-29 NOTE — Discharge Instructions (Signed)

## 2015-07-29 NOTE — ED Provider Notes (Signed)
CSN: [id number]     Arrival date & time 07/29/15  1100 History   First MD Initiated Contact with Patient 07/29/15 1112     Chief Complaint  Patient presents with  . Oral Swelling     (Consider location/radiation/quality/duration/timing/severity/associated sxs/prior Treatment) HPI Comments: The patient is a 34 year old male, he is currently on Xarelto for blood clots, he reports awaking this morning with swelling in the back of his throat, feels as though it is difficult to swallow, feels as though he is choking, symptoms are persistent, severe, no fevers but pain is fairly severe. He is able to open and close his mouth with minimal difficulty. Denies stiffness of the neck. He has not been having any fevers, chills, nausea, vomiting, coughing, shortness of breath, diarrhea, swelling or any other complaints  The history is provided by the patient.    Past Medical History  Diagnosis Date  . DVT (deep venous thrombosis)     denies taking xarelto x1 month.  states they plan to put me on some after surgery.  . Asthma     denies meds  . Schizophrenia     denies taking any meds  . Bipolar disorder     denies taking any meds   Past Surgical History  Procedure Laterality Date  . Facial cosmetic surgery      reconstructive  . Appendectomy    . Knee arthroscopy Left 08/03/2014    Procedure: LEFT ARTHROSCOPY KNEE WITH DEBRIDEMENT;  Surgeon: Sydnee Cabal, MD;  Location: [facility];  Service: Orthopedics;  Laterality: Left;  . Anterior cruciate ligament repair Left 08/03/2014    Procedure: LEFT ALLOGRAFT ACL RECONSTRUCTION/;  Surgeon: Sydnee Cabal, MD;  Location: [facility];  Service: Orthopedics;  Laterality: Left;   Family History  Problem Relation Age of Onset  . Diabetes Other   . Heart failure Other   . Diabetes Paternal Uncle    Social History  Substance Use Topics  . Smoking status: Current Every Day Smoker -- 0.25 packs/day for 10 years   Types: Cigarettes  . Smokeless tobacco: Never Used  . Alcohol Use: No     Comment: Denies use    Review of Systems  Constitutional: Negative for fever and chills.  All other systems reviewed and are negative.     Allergies  Latex  Home Medications   Prior to Admission medications   Medication Sig Start Date End Date Taking? Authorizing Provider  acetaminophen (TYLENOL) 500 MG tablet Take 2 tablets (1,000 mg total) by mouth every 6 (six) hours as needed. 06/14/15  Yes Charlesetta Shanks, MD  amoxicillin (AMOXIL) 500 MG capsule Take 1 capsule (500 mg total) by mouth 3 (three) times daily. 07/29/15   Noemi Chapel, MD  famotidine (PEPCID) 20 MG tablet Take 1 tablet (20 mg total) by mouth 2 (two) times daily. Patient not taking: Reported on 07/29/2015 06/14/15   Charlesetta Shanks, MD  HYDROcodone-acetaminophen (HYCET) 7.5-325 mg/15 ml solution Take 15 mLs by mouth every 8 (eight) hours as needed for moderate pain. 07/29/15   Noemi Chapel, MD  ibuprofen (ADVIL,MOTRIN) 800 MG tablet Take 1 tablet (800 mg total) by mouth 3 (three) times daily. 07/29/15   Noemi Chapel, MD  rivaroxaban (XARELTO) 20 MG TABS tablet Take 1 tablet (20 mg total) by mouth daily with supper. Patient not taking: Reported on 05/01/2015 08/30/14   Lorayne Marek, MD   BP 127/73 mmHg  Pulse 67  Temp(Src) 97.9 F (36.6 C) (Oral)  Resp 18  Ht 5\' 11"  (1.803 m)  Wt 234 lb (106.142 kg)  BMI 32.65 kg/m2  SpO2 100% Physical Exam  Constitutional: He appears well-developed and well-nourished. No distress.  HENT:  Head: Normocephalic and atraumatic.  Bilateral tonsillar hypertrophy erythema and a very enlarged edematous appearing uvula.  No trismus / torticollis, no ttp under the tongue or the jaw, no LAD of the neck.  TM's clear  Eyes: Conjunctivae and EOM are normal. Pupils are equal, round, and reactive to light. Right eye exhibits no discharge. Left eye exhibits no discharge. No scleral icterus.  Neck: Normal range of motion. Neck  supple. No JVD present. No thyromegaly present.  Cardiovascular: Normal rate, regular rhythm, normal heart sounds and intact distal pulses.  Exam reveals no gallop and no friction rub.   No murmur heard. Pulmonary/Chest: Effort normal and breath sounds normal. No respiratory distress. He has no wheezes. He has no rales.  Abdominal: Soft. Bowel sounds are normal. He exhibits no distension and no mass. There is no tenderness.  Musculoskeletal: Normal range of motion. He exhibits no edema or tenderness.  Lymphadenopathy:    He has no cervical adenopathy.  Neurological: He is alert. Coordination normal.  Skin: Skin is warm and dry. No rash noted. No erythema.  Psychiatric: He has a normal mood and affect. His behavior is normal.  Nursing note and vitals reviewed.   ED Course  Procedures (including critical care time) Labs Review Labs Reviewed  RAPID STREP SCREEN (NOT AT [facility])  CULTURE, GROUP A STREP    Imaging Review Dg Neck Soft Tissue  07/29/2015   CLINICAL DATA:  Acute neck swelling and sore throat for 2 days.  EXAM: NECK SOFT TISSUES - 1+ VIEW  COMPARISON:  03/25/2004 radiographs  FINDINGS: There is no evidence of retropharyngeal soft tissue swelling or epiglottic enlargement. The cervical airway is unremarkable and no radio-opaque foreign body identified.  IMPRESSION: Negative.   Electronically Signed   By: Margarette Canada M.D.   On: 07/29/2015 12:26   I have personally reviewed and evaluated these images and lab results as part of my medical decision-making.    MDM   Final diagnoses:  Uvulitis    The patient has significant swelling of his posterior pharynx and the uvula, the tonsils are large but not kissing, there is no airway obstruction, he is able to speak but obviously has some difficulty with this. We'll obtain a lateral plain films of the neck to rule out any other significant findings, he is not febrile nor tachycardic, doubt retropharyngeal abscess or peritonsillar  abscess.  VS normal - pt has meds as below, stable for d/c.  Meds given in ED:  Medications  HYDROcodone-acetaminophen (HYCET) 7.5-325 mg/15 ml solution 10 mL (10 mLs Oral Given 07/29/15 1145)  dexamethasone (DECADRON) injection 10 mg (10 mg Intravenous Given 07/29/15 1146)    New Prescriptions   AMOXICILLIN (AMOXIL) 500 MG CAPSULE    Take 1 capsule (500 mg total) by mouth 3 (three) times daily.   HYDROCODONE-ACETAMINOPHEN (HYCET) 7.5-325 MG/15 ML SOLUTION    Take 15 mLs by mouth every 8 (eight) hours as needed for moderate pain.   IBUPROFEN (ADVIL,MOTRIN) 800 MG TABLET    Take 1 tablet (800 mg total) by mouth 3 (three) times daily.      Noemi Chapel, MD 07/29/15 [phone]

## 2015-07-29 NOTE — ED Notes (Signed)
Patient transported to X-ray 

## 2015-07-29 NOTE — ED Notes (Signed)
Pt ambulated without assistance to waiting room. A+Ox4.

## 2015-07-31 LAB — CULTURE, GROUP A STREP

## 2015-09-13 ENCOUNTER — Emergency Department (HOSPITAL_COMMUNITY)
Admission: EM | Admit: 2015-09-13 | Discharge: 2015-09-13 | Disposition: A | Discharge status: Home / Self Care | Payer: Self-pay | Attending: Emergency Medicine | Admitting: Emergency Medicine

## 2015-09-13 ENCOUNTER — Encounter (HOSPITAL_COMMUNITY): Payer: Self-pay | Admitting: Cardiology

## 2015-09-13 ENCOUNTER — Emergency Department (HOSPITAL_COMMUNITY): Payer: Self-pay

## 2015-09-13 DIAGNOSIS — Z91040 Latex allergy status: Secondary | ICD-10-CM | POA: Insufficient documentation

## 2015-09-13 DIAGNOSIS — J45909 Unspecified asthma, uncomplicated: Secondary | ICD-10-CM | POA: Insufficient documentation

## 2015-09-13 DIAGNOSIS — F319 Bipolar disorder, unspecified: Secondary | ICD-10-CM | POA: Insufficient documentation

## 2015-09-13 DIAGNOSIS — Z86718 Personal history of other venous thrombosis and embolism: Secondary | ICD-10-CM | POA: Insufficient documentation

## 2015-09-13 DIAGNOSIS — R51 Headache: Secondary | ICD-10-CM | POA: Insufficient documentation

## 2015-09-13 DIAGNOSIS — F17210 Nicotine dependence, cigarettes, uncomplicated: Secondary | ICD-10-CM | POA: Insufficient documentation

## 2015-09-13 DIAGNOSIS — Z792 Long term (current) use of antibiotics: Secondary | ICD-10-CM | POA: Insufficient documentation

## 2015-09-13 DIAGNOSIS — J069 Acute upper respiratory infection, unspecified: Secondary | ICD-10-CM

## 2015-09-13 DIAGNOSIS — Z79899 Other long term (current) drug therapy: Secondary | ICD-10-CM | POA: Insufficient documentation

## 2015-09-13 DIAGNOSIS — F209 Schizophrenia, unspecified: Secondary | ICD-10-CM | POA: Insufficient documentation

## 2015-09-13 LAB — RAPID STREP SCREEN (MED CTR MEBANE ONLY): Streptococcus, Group A Screen (Direct): NEGATIVE

## 2015-09-13 MED ORDER — IBUPROFEN 800 MG PO TABS
800.0000 mg | ORAL_TABLET | Freq: Once | ORAL | Status: AC
  Administered 2015-09-13: 800 mg via ORAL
  Filled 2015-09-13: qty 1

## 2015-09-13 MED ORDER — OXYMETAZOLINE HCL 0.05 % NA SOLN: 1.0000 | Freq: Two times a day (BID) | NASAL | Status: DC

## 2015-09-13 MED ORDER — ALBUTEROL SULFATE HFA 108 (90 BASE) MCG/ACT IN AERS: 1.0000 | INHALATION_SPRAY | Freq: Four times a day (QID) | RESPIRATORY_TRACT | Status: DC | PRN

## 2015-09-13 NOTE — ED Notes (Signed)
Pt off unit with xray 

## 2015-09-13 NOTE — Discharge Instructions (Signed)
Cool Mist Vaporizers °Vaporizers may help relieve the symptoms of a cough and cold. They add moisture to the air, which helps mucus to become thinner and less sticky. This makes it easier to breathe and cough up secretions. Cool mist vaporizers do not cause serious burns like hot mist vaporizers, which may also be called steamers or humidifiers. Vaporizers have not been proven to help with colds. You should not use a vaporizer if you are allergic to mold. °HOME CARE INSTRUCTIONS °· Follow the package instructions for the vaporizer. °· Do not use anything other than distilled water in the vaporizer. °· Do not run the vaporizer all of the time. This can cause mold or bacteria to grow in the vaporizer. °· Clean the vaporizer after each time it is used. °· Clean and dry the vaporizer well before storing it. °· Stop using the vaporizer if worsening respiratory symptoms develop. °  °This information is not intended to replace advice given to you by your health care provider. Make sure you discuss any questions you have with your health care provider. °  °Document Released: 07/10/2004 Document Revised: 10/18/2013 Document Reviewed: 03/02/2013 °Elsevier Interactive Patient Education ©2016 [employer] ° °Upper Respiratory Infection, Adult °Most upper respiratory infections (URIs) are a viral infection of the air passages leading to the lungs. A URI affects the nose, throat, and upper air passages. The most common type of URI is nasopharyngitis and is typically referred to as "the common cold." °URIs run their course and usually go away on their own. Most of the time, a URI does not require medical attention, but sometimes a bacterial infection in the upper airways can follow a viral infection. This is called a secondary infection. Sinus and middle ear infections are common types of secondary upper respiratory infections. °Bacterial pneumonia can also complicate a URI. A URI can worsen asthma and chronic obstructive  pulmonary disease (COPD). Sometimes, these complications can require emergency medical care and may be life threatening.  °CAUSES °Almost all URIs are caused by viruses. A virus is a type of germ and can spread from one person to another.  °RISKS FACTORS °You may be at risk for a URI if:  °· You smoke.   °· You have chronic heart or lung disease. °· You have a weakened defense (immune) system.   °· You are very young or very old.   °· You have nasal allergies or asthma. °· You work in crowded or poorly ventilated areas. °· You work in health care facilities or schools. °SIGNS AND SYMPTOMS  °Symptoms typically develop 2-3 days after you come in contact with a cold virus. Most viral URIs last 7-10 days. However, viral URIs from the influenza virus (flu virus) can last 14-18 days and are typically more severe. Symptoms may include:  °· Runny or stuffy (congested) nose.   °· Sneezing.   °· Cough.   °· Sore throat.   °· Headache.   °· Fatigue.   °· Fever.   °· Loss of appetite.   °· Pain in your forehead, behind your eyes, and over your cheekbones (sinus pain). °· Muscle aches.   °DIAGNOSIS  °Your health care provider may diagnose a URI by: °· Physical exam. °· Tests to check that your symptoms are not due to another condition such as: °¨ Strep throat. °¨ Sinusitis. °¨ Pneumonia. °¨ Asthma. °TREATMENT  °A URI goes away on its own with time. It cannot be cured with medicines, but medicines may be prescribed or recommended to relieve symptoms. Medicines may help: °· Reduce your fever. °· Reduce   your cough.  Relieve nasal congestion. HOME CARE INSTRUCTIONS   Take medicines only as directed by your health care provider.   Gargle warm saltwater or take cough drops to comfort your throat as directed by your health care provider.  Use a warm mist humidifier or inhale steam from a shower to increase air moisture. This may make it easier to breathe.  Drink enough fluid to keep your urine clear or pale yellow.   Eat  soups and other clear broths and maintain good nutrition.   Rest as needed.   Return to work when your temperature has returned to normal or as your health care provider advises. You may need to stay home longer to avoid infecting others. You can also use a face mask and careful hand washing to prevent spread of the virus.  Increase the usage of your inhaler if you have asthma.   Do not use any tobacco products, including cigarettes, chewing tobacco, or electronic cigarettes. If you need help quitting, ask your health care provider. PREVENTION  The best way to protect yourself from getting a cold is to practice good hygiene.   Avoid oral or hand contact with people with cold symptoms.   Wash your hands often if contact occurs.  There is no clear evidence that vitamin C, vitamin E, echinacea, or exercise reduces the chance of developing a cold. However, it is always recommended to get plenty of rest, exercise, and practice good nutrition.  SEEK MEDICAL CARE IF:   You are getting worse rather than better.   Your symptoms are not controlled by medicine.   You have chills.  You have worsening shortness of breath.  You have Rager or red mucus.  You have yellow or Czajka nasal discharge.  You have pain in your face, especially when you bend forward.  You have a fever.  You have swollen neck glands.  You have pain while swallowing.  You have white areas in the back of your throat. SEEK IMMEDIATE MEDICAL CARE IF:   You have severe or persistent:  Headache.  Ear pain.  Sinus pain.  Chest pain.  You have chronic lung disease and any of the following:  Wheezing.  Prolonged cough.  Coughing up blood.  A change in your usual mucus.  You have a stiff neck.  You have changes in your:  Vision.  Hearing.  Thinking.  Mood. MAKE SURE YOU:   Understand these instructions.  Will watch your condition.  Will get help right away if you are not doing well or  get worse.   This information is not intended to replace advice given to you by your health care provider. Make sure you discuss any questions you have with your health care provider.   Follow up with her primary care provider in 24-48 hours if symptoms do not improve. Encourage nasal rinsing with the Nettie pot. Use Afrin nasal spray as needed for congestion. Alternate ibuprofen and Tylenol every 3 hours for headache. Take albuterol as needed for shortness of breath. Return to the emergency department if you experience worsening of her symptoms, fever, difficulty breathing or swallowing, increased chest pain.

## 2015-09-13 NOTE — ED Provider Notes (Signed)
CSN: [id number]     Arrival date & time 09/13/15  0904 History   First MD Initiated Contact with Patient 09/13/15 [phone]     Chief Complaint  Patient presents with  . Cough  . Nasal Congestion  . Headache     (Consider location/radiation/quality/duration/timing/severity/associated sxs/prior Treatment) HPI   Douglas Sloan is a 34 y.o m with a pmhx of asthma, DVT, schizophrenia presents to the emergency department today complaining of congestion, cough. Patient states that h he began having sinus pressure and congestion 4 days ago. Now with associated cough that is nonproductive, headache and sore throat. Patient states that he has pain in his chest when he coughs. Pain does not radiate. Pain is only associated with coughing. Patient has been taking Mucinex with no relief. Denies fever, difficulty breathing, dysphagia, syncope, dizziness, weakness, paresthesias, otalgia.  Past Medical History  Diagnosis Date  . DVT (deep venous thrombosis) ([place])     denies taking xarelto x1 month.  states they plan to put me on some after surgery.  . Asthma     denies meds  . Schizophrenia ([place])     denies taking any meds  . Bipolar disorder ([place])     denies taking any meds   Past Surgical History  Procedure Laterality Date  . Facial cosmetic surgery      reconstructive  . Appendectomy    . Knee arthroscopy Left 08/03/2014    Procedure: LEFT ARTHROSCOPY KNEE WITH DEBRIDEMENT;  Surgeon: Sydnee Cabal, MD;  [facility]: [facility]  Service: Orthopedics;  Laterality: Left;  . Anterior cruciate ligament repair Left 08/03/2014    Procedure: LEFT ALLOGRAFT ACL RECONSTRUCTION/;  Surgeon: Sydnee Cabal, MD;  Location[facility];  Service: Orthopedics;  Laterality: Left;   Family History  Problem Relation Age of Onset  . Diabetes Other   . Heart failure Other   . Diabetes Paternal Uncle    Social History  Substance Use Topics  . Smoking status: Current Every Day Smoker --  0.25 packs/day for 10 years    Types: Cigarettes  . Smokeless tobacco: Never Used  . Alcohol Use: No     Comment: Denies use    Review of Systems  All other systems reviewed and are negative.     Allergies  Latex  Home Medications   Prior to Admission medications   Medication Sig Start Date End Date Taking? Authorizing Provider  amoxicillin (AMOXIL) 500 MG capsule Take 1 capsule (500 mg total) by mouth 3 (three) times daily. 07/29/15  Yes Noemi Chapel, MD  HYDROcodone-acetaminophen (HYCET) 7.5-325 mg/15 ml solution Take 15 mLs by mouth every 8 (eight) hours as needed for moderate pain. 07/29/15  Yes Noemi Chapel, MD  lisdexamfetamine (VYVANSE) 30 MG capsule Take 30 mg by mouth every other day.   Yes Historical Provider, MD  lurasidone (LATUDA) 40 MG TABS tablet Take 40 mg by mouth every other day.   Yes Historical Provider, MD  QUEtiapine (SEROQUEL XR) 300 MG 24 hr tablet Take 300 mg by mouth at bedtime.   Yes Historical Provider, MD  acetaminophen (TYLENOL) 500 MG tablet Take 2 tablets (1,000 mg total) by mouth every 6 (six) hours as needed. 06/14/15   Charlesetta Shanks, MD  albuterol (PROVENTIL HFA;VENTOLIN HFA) 108 (90 BASE) MCG/ACT inhaler Inhale 1-2 puffs into the lungs every 6 (six) hours as needed for wheezing or shortness of breath. 09/13/15   Raeden Belzer Tripp Pressley Tadesse, PA-C  famotidine (PEPCID) 20 MG tablet Take 1 tablet (  20 mg total) by mouth 2 (two) times daily. Patient not taking: Reported on 07/29/2015 06/14/15   Charlesetta Shanks, MD  ibuprofen (ADVIL,MOTRIN) 800 MG tablet Take 1 tablet (800 mg total) by mouth 3 (three) times daily. Patient not taking: Reported on 09/13/2015 07/29/15   Noemi Chapel, MD  oxymetazoline [facility] NASAL SPRAY) 0.05 % nasal spray Place 1 spray into both nostrils 2 (two) times daily. 09/13/15   Pola Furno Tripp Lassie Demorest, PA-C  rivaroxaban (XARELTO) 20 MG TABS tablet Take 1 tablet (20 mg total) by mouth daily with supper. Patient not taking: Reported on 05/01/2015  08/30/14   Lorayne Marek, MD   BP 147/85 mmHg  Pulse 91  Temp(Src) 98.8 F (37.1 C) (Oral)  Resp 18  Ht 5\' 11"  (1.803 m)  Wt 225 lb (102.059 kg)  BMI 31.39 kg/m2  SpO2 97% Physical Exam  Constitutional: He is oriented to person, place, and time. He appears well-developed and well-nourished. No distress.  HENT:  Head: Normocephalic and atraumatic.  Nose: Mucosal edema and rhinorrhea present. Right sinus exhibits maxillary sinus tenderness. Left sinus exhibits maxillary sinus tenderness.  Mouth/Throat: Uvula is midline. No uvula swelling. Posterior oropharyngeal erythema present. No oropharyngeal exudate or tonsillar abscesses.  Eyes: Conjunctivae and EOM are normal. Pupils are equal, round, and reactive to light. Right eye exhibits no discharge. Left eye exhibits no discharge. No scleral icterus.  Cardiovascular: Normal rate, regular rhythm, normal heart sounds and intact distal pulses.  Exam reveals no gallop and no friction rub.   No murmur heard. Pulmonary/Chest: Effort normal and breath sounds normal. No respiratory distress. He has no wheezes. He has no rales. He exhibits no tenderness.  Abdominal: Soft. He exhibits no distension. There is no tenderness. There is no guarding.  Musculoskeletal: Normal range of motion. He exhibits no edema.  Neurological: He is alert and oriented to person, place, and time. No cranial nerve deficit.  Strength 5/5 throughout. No sensory deficits.  No gait abnormality.  Skin: Skin is warm and dry. No rash noted. He is not diaphoretic. No erythema. No pallor.  Psychiatric: He has a normal mood and affect. His behavior is normal.  Nursing note and vitals reviewed.   ED Course  Procedures (including critical care time) Labs Review Labs Reviewed  RAPID STREP SCREEN (NOT AT [facility])  CULTURE, GROUP A STREP    Imaging Review Dg Chest 2 View  09/13/2015  CLINICAL DATA:  Pt reports a cough, sinus pressure and headache for the past couple of days. Pt  also states he is having chest pain and SOB. Hx asthma EXAM: CHEST  2 VIEW COMPARISON:  04/06/2014 FINDINGS: The heart size and mediastinal contours are within normal limits. Both lungs are clear. The visualized skeletal structures are unremarkable. IMPRESSION: No active cardiopulmonary disease. Electronically Signed   By: Nolon Nations M.D.   On: 09/13/2015 10:15   I have personally reviewed and evaluated these images and lab results as part of my medical decision-making.   EKG Interpretation None      MDM   Final diagnoses:  Viral URI    34 year old male with history of asthma presents with four-day history of congestion, sore throat, cough. Throat erythematous on exam. No dysphagia or odynophagia. We'll obtain rapid strep screen. Chest x-ray negative for infection.  Patient has significant nasal congestion and rhinorrhea. Nares boggy, requiring patient to breathe through his mouth. Patient has a nonproductive cough. Patient states that he has pain in his chest with coughing. Suspect costochondritis. All the  patient has a history of DVT, no lower extremity swelling at this time. Patient completed his course of Xarelto. Patient not hypoxic or tachycardic. Low suspicion for PE. Patient has attributable source of chest pain and cough. Not pleuritic in nature. No shortness of breath. We'll treat this as a viral URI. Encourage nasal rinsing. Will give prescription for nasal spray and albuterol. May take ibuprofen as needed for headache. May take OTC Mucinex for congestion. Patient will follow-up with his primary care provider in 24-48 hours for reevaluation. Vital signs stable. Patient stable for discharge.    Dondra Spry [place]ga, PA-C 09/13/15 [place], MD 09/14/15 [phone]

## 2015-09-13 NOTE — ED Notes (Signed)
Pt reports a cough, sinus pressure and headache for the past couple of days. Has tried OTC medication at home without much relief.

## 2015-09-15 LAB — CULTURE, GROUP A STREP: Strep A Culture: NEGATIVE

## 2015-11-14 ENCOUNTER — Emergency Department (HOSPITAL_COMMUNITY)
Admission: EM | Admit: 2015-11-14 | Discharge: 2015-11-14 | Discharge status: Absent without leave | Payer: No Typology Code available for payment source

## 2015-11-14 ENCOUNTER — Encounter (HOSPITAL_COMMUNITY): Payer: Self-pay | Admitting: Emergency Medicine

## 2015-11-14 ENCOUNTER — Emergency Department (HOSPITAL_COMMUNITY)
Admission: EM | Admit: 2015-11-14 | Discharge: 2015-11-15 | Disposition: A | Discharge status: Home / Self Care | Payer: Self-pay | Attending: Emergency Medicine | Admitting: Emergency Medicine

## 2015-11-14 ENCOUNTER — Emergency Department (HOSPITAL_COMMUNITY): Payer: Self-pay

## 2015-11-14 DIAGNOSIS — R059 Cough, unspecified: Secondary | ICD-10-CM

## 2015-11-14 DIAGNOSIS — Z7901 Long term (current) use of anticoagulants: Secondary | ICD-10-CM | POA: Insufficient documentation

## 2015-11-14 DIAGNOSIS — F319 Bipolar disorder, unspecified: Secondary | ICD-10-CM | POA: Insufficient documentation

## 2015-11-14 DIAGNOSIS — Z86718 Personal history of other venous thrombosis and embolism: Secondary | ICD-10-CM | POA: Insufficient documentation

## 2015-11-14 DIAGNOSIS — Z91040 Latex allergy status: Secondary | ICD-10-CM | POA: Insufficient documentation

## 2015-11-14 DIAGNOSIS — Z79899 Other long term (current) drug therapy: Secondary | ICD-10-CM | POA: Insufficient documentation

## 2015-11-14 DIAGNOSIS — F209 Schizophrenia, unspecified: Secondary | ICD-10-CM | POA: Insufficient documentation

## 2015-11-14 DIAGNOSIS — J45901 Unspecified asthma with (acute) exacerbation: Secondary | ICD-10-CM | POA: Insufficient documentation

## 2015-11-14 DIAGNOSIS — R05 Cough: Secondary | ICD-10-CM

## 2015-11-14 DIAGNOSIS — J069 Acute upper respiratory infection, unspecified: Secondary | ICD-10-CM | POA: Insufficient documentation

## 2015-11-14 DIAGNOSIS — F17210 Nicotine dependence, cigarettes, uncomplicated: Secondary | ICD-10-CM | POA: Insufficient documentation

## 2015-11-14 MED ORDER — ALBUTEROL SULFATE (2.5 MG/3ML) 0.083% IN NEBU
5.0000 mg | INHALATION_SOLUTION | Freq: Once | RESPIRATORY_TRACT | Status: AC
  Administered 2015-11-15: 5 mg via RESPIRATORY_TRACT
  Filled 2015-11-14: qty 6

## 2015-11-14 NOTE — ED Notes (Signed)
Pt here with c/o persistent cough. Pt states he is coughing up green mucous. Pt states this has been going on since Monday

## 2015-11-15 MED ORDER — HYDROCODONE-ACETAMINOPHEN 7.5-325 MG/15ML PO SOLN: 10.0000 mL | Freq: Four times a day (QID) | ORAL | Status: DC | PRN

## 2015-11-15 MED ORDER — ALBUTEROL SULFATE HFA 108 (90 BASE) MCG/ACT IN AERS
2.0000 | INHALATION_SPRAY | RESPIRATORY_TRACT | Status: DC | PRN
  Administered 2015-11-15: 2 via RESPIRATORY_TRACT
  Filled 2015-11-15: qty 6.7

## 2015-11-15 MED ORDER — PREDNISONE 10 MG PO TABS: ORAL_TABLET | ORAL | Status: DC

## 2015-11-15 NOTE — ED Provider Notes (Signed)
CSN: [id number]     Arrival date & time 11/14/15  2114 History   First MD Initiated Contact with Patient 11/15/15 [phone]     Chief Complaint  Patient presents with  . Shortness of Breath     (Consider location/radiation/quality/duration/timing/severity/associated sxs/prior Treatment) HPI Comments: Patient with a history of asthma presents with symptoms that started with nasal congestion 3 days ago that has settled in his chest with persistent cough and chest tightness. He does not have an inhaler. No fever at any time but he reports chills. No nausea but he has had one episode of post-tussive vomiting.   The history is provided by the patient. No language interpreter was used.    Past Medical History  Diagnosis Date  . DVT (deep venous thrombosis) ([place])     denies taking xarelto x1 month.  states they plan to put me on some after surgery.  . Asthma     denies meds  . Schizophrenia ([place])     denies taking any meds  . Bipolar disorder ([place])     denies taking any meds   Past Surgical History  Procedure Laterality Date  . Facial cosmetic surgery      reconstructive  . Appendectomy    . Knee arthroscopy Left 08/03/2014    Procedure: LEFT ARTHROSCOPY KNEE WITH DEBRIDEMENT;  Surgeon: Sydnee Cabal, MD;  [facility];  Service: Orthopedics;  Laterality: Left;  . Anterior cruciate ligament repair Left 08/03/2014    Procedure: LEFT ALLOGRAFT ACL RECONSTRUCTION/;  Surgeon: Sydnee Cabal, MD;  [facility];  Service: Orthopedics;  Laterality: Left;   Family History  Problem Relation Age of Onset  . Diabetes Other   . Heart failure Other   . Diabetes Paternal Uncle    Social History  Substance Use Topics  . Smoking status: Current Every Day Smoker -- 0.25 packs/day for 10 years    Types: Cigarettes  . Smokeless tobacco: Never Used  . Alcohol Use: No     Comment: Denies use    Review of Systems  Constitutional: Negative for fever and  chills.  HENT: Positive for congestion. Negative for sore throat and trouble swallowing.   Respiratory: Positive for shortness of breath.   Cardiovascular: Negative.   Gastrointestinal: Negative.  Negative for nausea and abdominal pain.  Musculoskeletal: Negative.  Negative for myalgias.  Skin: Negative.   Neurological: Negative.       Allergies  Latex  Home Medications   Prior to Admission medications   Medication Sig Start Date End Date Taking? Authorizing Provider  albuterol (PROVENTIL HFA;VENTOLIN HFA) 108 (90 BASE) MCG/ACT inhaler Inhale 1-2 puffs into the lungs every 6 (six) hours as needed for wheezing or shortness of breath. 09/13/15  Yes Samantha Tripp Dowless, PA-C  dextromethorphan (DELSYM) 30 MG/5ML liquid Take 60 mg by mouth as needed for cough.   Yes Historical Provider, MD  ibuprofen (ADVIL,MOTRIN) 200 MG tablet Take 400 mg by mouth every 6 (six) hours as needed for moderate pain.   Yes Historical Provider, MD  acetaminophen (TYLENOL) 500 MG tablet Take 2 tablets (1,000 mg total) by mouth every 6 (six) hours as needed. Patient not taking: Reported on 11/14/2015 06/14/15   Charlesetta Shanks, MD  amoxicillin (AMOXIL) 500 MG capsule Take 1 capsule (500 mg total) by mouth 3 (three) times daily. Patient not taking: Reported on 11/14/2015 07/29/15   Noemi Chapel, MD  famotidine (PEPCID) 20 MG tablet Take 1 tablet (20 mg total) by mouth  2 (two) times daily. Patient not taking: Reported on 07/29/2015 06/14/15   Charlesetta Shanks, MD  HYDROcodone-acetaminophen (HYCET) 7.5-325 mg/15 ml solution Take 15 mLs by mouth every 8 (eight) hours as needed for moderate pain. Patient not taking: Reported on 11/14/2015 07/29/15   Noemi Chapel, MD  ibuprofen (ADVIL,MOTRIN) 800 MG tablet Take 1 tablet (800 mg total) by mouth 3 (three) times daily. Patient not taking: Reported on 09/13/2015 07/29/15   Noemi Chapel, MD  oxymetazoline [facility] NASAL SPRAY) 0.05 % nasal spray Place 1 spray into both nostrils 2  (two) times daily. Patient not taking: Reported on 11/14/2015 09/13/15   Samantha Tripp Dowless, PA-C  QUEtiapine (SEROQUEL XR) 300 MG 24 hr tablet Take 300 mg by mouth at bedtime.    Historical Provider, MD  rivaroxaban (XARELTO) 20 MG TABS tablet Take 1 tablet (20 mg total) by mouth daily with supper. Patient not taking: Reported on 05/01/2015 08/30/14   Lorayne Marek, MD   BP 144/123 mmHg  Pulse 77  Temp(Src) 98.4 F (36.9 C) (Oral)  Resp 20  Ht 5\' 11"  (1.803 m)  Wt 106.595 kg  BMI 32.79 kg/m2  SpO2 99% Physical Exam  Constitutional: He is oriented to person, place, and time. He appears well-developed and well-nourished.  HENT:  Head: Normocephalic.  Neck: Normal range of motion. Neck supple.  Cardiovascular: Normal rate and regular rhythm.   Pulmonary/Chest: Effort normal and breath sounds normal. He has no wheezes. He has no rales.  Actively coughing.  Abdominal: Soft. Bowel sounds are normal. There is no tenderness. There is no rebound and no guarding.  Musculoskeletal: Normal range of motion. He exhibits no edema.  Neurological: He is alert and oriented to person, place, and time.  Skin: Skin is warm and dry. No rash noted.  Psychiatric: He has a normal mood and affect.    ED Course  Procedures (including critical care time) Labs Review Labs Reviewed - No data to display  Imaging Review Dg Chest 2 View  11/14/2015  CLINICAL DATA:  Acute onset of mid chest pain, shortness of breath and productive cough. Initial encounter. EXAM: CHEST  2 VIEW COMPARISON:  Chest radiograph performed 09/13/2015 FINDINGS: The lungs are well-aerated and clear. There is no evidence of focal opacification, pleural effusion or pneumothorax. The heart is normal in size; the mediastinal contour is within normal limits. No acute osseous abnormalities are seen. IMPRESSION: No acute cardiopulmonary process seen. Electronically Signed   By: Garald Balding M.D.   On: 11/14/2015 22:00   I have personally  reviewed and evaluated these images and lab results as part of my medical decision-making.   EKG Interpretation None      MDM   Final diagnoses:  None    1. Cough 2. URI  He reports improved cough temporarily with Albuterol neb given here. He is well appearing, non-toxic. Lungs clear, CXR clear. No hypoxia. He is felt appropriate for discharge. Will provide inhaler and cough suppressant with 3-day prednisone regimen.    Charlann Lange, PA-C 11/15/15 0309  Quintella Reichert, MD 11/15/15 2031

## 2015-11-15 NOTE — Discharge Instructions (Signed)

## 2015-11-15 NOTE — ED Notes (Signed)
Pt stated he felt better after breathing treatment; returned to lobby; pt stated he will wait for further examination

## 2016-01-23 ENCOUNTER — Encounter (HOSPITAL_BASED_OUTPATIENT_CLINIC_OR_DEPARTMENT_OTHER): Payer: Self-pay

## 2016-01-23 ENCOUNTER — Emergency Department (HOSPITAL_BASED_OUTPATIENT_CLINIC_OR_DEPARTMENT_OTHER)
Admission: EM | Admit: 2016-01-23 | Discharge: 2016-01-23 | Disposition: A | Discharge status: Home / Self Care | Payer: No Typology Code available for payment source | Attending: Emergency Medicine | Admitting: Emergency Medicine

## 2016-01-23 DIAGNOSIS — F319 Bipolar disorder, unspecified: Secondary | ICD-10-CM | POA: Insufficient documentation

## 2016-01-23 DIAGNOSIS — F209 Schizophrenia, unspecified: Secondary | ICD-10-CM | POA: Insufficient documentation

## 2016-01-23 DIAGNOSIS — F17210 Nicotine dependence, cigarettes, uncomplicated: Secondary | ICD-10-CM | POA: Insufficient documentation

## 2016-01-23 DIAGNOSIS — Z79899 Other long term (current) drug therapy: Secondary | ICD-10-CM | POA: Insufficient documentation

## 2016-01-23 DIAGNOSIS — Z86718 Personal history of other venous thrombosis and embolism: Secondary | ICD-10-CM | POA: Insufficient documentation

## 2016-01-23 DIAGNOSIS — J45909 Unspecified asthma, uncomplicated: Secondary | ICD-10-CM | POA: Insufficient documentation

## 2016-01-23 DIAGNOSIS — Z91040 Latex allergy status: Secondary | ICD-10-CM | POA: Insufficient documentation

## 2016-01-23 DIAGNOSIS — Z7901 Long term (current) use of anticoagulants: Secondary | ICD-10-CM | POA: Insufficient documentation

## 2016-01-23 DIAGNOSIS — K0889 Other specified disorders of teeth and supporting structures: Secondary | ICD-10-CM

## 2016-01-23 MED ORDER — AMOXICILLIN 500 MG PO CAPS: 500.0000 mg | ORAL_CAPSULE | Freq: Three times a day (TID) | ORAL | Status: DC

## 2016-01-23 MED ORDER — AMOXICILLIN 500 MG PO CAPS
500.0000 mg | ORAL_CAPSULE | Freq: Once | ORAL | Status: AC
  Administered 2016-01-23: 500 mg via ORAL
  Filled 2016-01-23: qty 1

## 2016-01-23 MED ORDER — BUPIVACAINE-EPINEPHRINE (PF) 0.5% -1:200000 IJ SOLN
1.8000 mL | Freq: Once | INTRAMUSCULAR | Status: AC
  Administered 2016-01-23: 1.8 mL
  Filled 2016-01-23: qty 1.8

## 2016-01-23 MED ORDER — OXYCODONE-ACETAMINOPHEN 5-325 MG PO TABS: ORAL_TABLET | ORAL | Status: DC

## 2016-01-23 MED FILL — AMOXICILLIN 500 MG CAPSULE: 500 | 10 days supply | Qty: 30 | Fill #0

## 2016-01-23 MED FILL — OXYCODONE/APAP 5-325: 5-325 | 2 days supply | Qty: 15 | Fill #0

## 2016-01-23 NOTE — ED Notes (Signed)
Right upper toothache x 2+ months, right temporal HA and earache-NAD-steady gait-holding at towel to face upon arrival to triage-last dose advil 1030am

## 2016-01-23 NOTE — ED Provider Notes (Signed)
CSN: [id number]     Arrival date & time 01/23/16  1131 History   First MD Initiated Contact with Patient 01/23/16 1144     Chief Complaint  Patient presents with  . Dental Pain     (Consider location/radiation/quality/duration/timing/severity/associated sxs/prior Treatment) HPI  Blood pressure 126/74, pulse 82, temperature 98.2 F (36.8 C), temperature source Oral, resp. rate 18, height 5\' 9"  (1.753 m), weight 105.235 kg, SpO2 98 %.  Douglas Sloan is a 35 y.o. male complaining of severe right lower tooth pain worsening over the course of one month, been especially severe over the last 2 days it radiates up to the [facility] ear and head. Patient's been taking ibuprofen, Tylenol and a low-salt home with little relief. Denies fever/chills, difficulty opening jaw, difficulty swallowing, SOB, gum swelling, facial swelling, neck swelling.    Past Medical History  Diagnosis Date  . DVT (deep venous thrombosis) ([place])     denies taking xarelto x1 month.  states they plan to put me on some after surgery.  . Asthma     denies meds  . Schizophrenia ([place])     denies taking any meds  . Bipolar disorder ([place])     denies taking any meds   Past Surgical History  Procedure Laterality Date  . Facial cosmetic surgery      reconstructive  . Appendectomy    . Knee arthroscopy Left 08/03/2014    Procedure: LEFT ARTHROSCOPY KNEE WITH DEBRIDEMENT;  Surgeon: Sydnee Cabal, MD;  Location: [facility];  Service: Orthopedics;  Laterality: Left;  . Anterior cruciate ligament repair Left 08/03/2014    Procedure: LEFT ALLOGRAFT ACL RECONSTRUCTION/;  Surgeon: Sydnee Cabal, MD;  Location: [facility];  Service: Orthopedics;  Laterality: Left;   Family History  Problem Relation Age of Onset  . Diabetes Other   . Heart failure Other   . Diabetes Paternal Uncle    Social History  Substance Use Topics  . Smoking status: Current Every Day Smoker -- 0.25 packs/day for 10 years     Types: Cigarettes  . Smokeless tobacco: Never Used  . Alcohol Use: No    Review of Systems   10 systems reviewed and found to be negative, except as noted in the HPI.  Allergies  Latex  Home Medications   Prior to Admission medications   Medication Sig Start Date End Date Taking? Authorizing Provider  acetaminophen (TYLENOL) 500 MG tablet Take 2 tablets (1,000 mg total) by mouth every 6 (six) hours as needed. Patient not taking: Reported on 11/14/2015 06/14/15   Charlesetta Shanks, MD  albuterol (PROVENTIL HFA;VENTOLIN HFA) 108 (90 BASE) MCG/ACT inhaler Inhale 1-2 puffs into the lungs every 6 (six) hours as needed for wheezing or shortness of breath. 09/13/15   Samantha Tripp Dowless, PA-C  amoxicillin (AMOXIL) 500 MG capsule Take 1 capsule (500 mg total) by mouth 3 (three) times daily. 01/23/16   Kendalyn Cranfield, PA-C  dextromethorphan (DELSYM) 30 MG/5ML liquid Take 60 mg by mouth as needed for cough.    Historical Provider, MD  famotidine (PEPCID) 20 MG tablet Take 1 tablet (20 mg total) by mouth 2 (two) times daily. Patient not taking: Reported on 07/29/2015 06/14/15   Charlesetta Shanks, MD  HYDROcodone-acetaminophen (HYCET) 7.5-325 mg/15 ml solution Take 10 mLs by mouth 4 (four) times daily as needed for moderate pain. 11/15/15   Charlann Lange, PA-C  ibuprofen (ADVIL,MOTRIN) 200 MG tablet Take 400 mg by mouth every 6 (six) hours as needed for  moderate pain.    Historical Provider, MD  ibuprofen (ADVIL,MOTRIN) 800 MG tablet Take 1 tablet (800 mg total) by mouth 3 (three) times daily. Patient not taking: Reported on 09/13/2015 07/29/15   Noemi Chapel, MD  oxyCODONE-acetaminophen (PERCOCET/ROXICET) 5-325 MG tablet 1 to 2 tabs PO q6hrs  PRN for pain 01/23/16   Elmyra Ricks Chesni Vos, PA-C  oxymetazoline (AFRIN NASAL SPRAY) 0.05 % nasal spray Place 1 spray into both nostrils 2 (two) times daily. Patient not taking: Reported on 11/14/2015 09/13/15   Samantha Tripp Dowless, PA-C  predniSONE (DELTASONE) 10 MG  tablet Take 6 on day 1 Take 5 on day 2 Take 4 on day 3 Take 3 on day 4 Take 2 on day 5 Take 1 on day 6 11/15/15   Charlann Lange, PA-C  QUEtiapine (SEROQUEL XR) 300 MG 24 hr tablet Take 300 mg by mouth at bedtime.    Historical Provider, MD  rivaroxaban (XARELTO) 20 MG TABS tablet Take 1 tablet (20 mg total) by mouth daily with supper. Patient not taking: Reported on 05/01/2015 08/30/14   Lorayne Marek, MD   BP 126/74 mmHg  Pulse 82  Temp(Src) 98.2 F (36.8 C) (Oral)  Resp 18  Ht 5\' 9"  (1.753 m)  Wt 105.235 kg  BMI 34.24 kg/m2  SpO2 98% Physical Exam  Constitutional: He is oriented to person, place, and time. He appears well-developed and well-nourished. No distress.  In fetal position, eyes closed with a towel over right jaw.   HENT:  Head: Normocephalic.  Mouth/Throat: Oropharynx is clear and moist.  Generally good dentition, no gingival swelling, erythema or tenderness to palpation. Patient is handling their secretions. There is no tenderness to palpation or firmness underneath tongue bilaterally. No trismus.   Bilateral tympanic membranes with normal architecture and good light reflex.  Eyes: Conjunctivae and EOM are normal. Pupils are equal, round, and reactive to light.  Neck: Normal range of motion.  Cardiovascular: Normal rate.   Pulmonary/Chest: Effort normal. No stridor.  Abdominal: Soft.  Musculoskeletal: Normal range of motion.  Lymphadenopathy:    He has no cervical adenopathy.  Neurological: He is alert and oriented to person, place, and time.  Psychiatric: He has a normal mood and affect.  Nursing note and vitals reviewed.   ED Course  .Nerve Block Date/Time: 01/23/2016 12:01 PM Performed by: Monico Blitz Authorized by: Monico Blitz Consent: Verbal consent obtained. Risks and benefits: risks, benefits and alternatives were discussed Consent given by: patient Required items: required blood products, implants, devices, and special equipment  available Patient identity confirmed: verbally with patient Time out: Immediately prior to procedure a "time out" was called to verify the correct patient, procedure, equipment, support staff and site/side marked as required. Indications: pain relief Patient position: sitting Needle gauge: 26 G Location technique: anatomical landmarks Local anesthetic: bupivacaine 0.5% with epinephrine Anesthetic total: 1.8 ml Outcome: pain improved Patient tolerance: Patient tolerated the procedure well with no immediate complications   (including critical care time) Labs Review Labs Reviewed - No data to display  Imaging Review No results found. I have personally reviewed and evaluated these images and lab results as part of my medical decision-making.   EKG Interpretation None      MDM   Final diagnoses:  Dentalgia    Filed Vitals:   01/23/16 1141  BP: 126/74  Pulse: 82  Temp: 98.2 F (36.8 C)  TempSrc: Oral  Resp: 18  Height: 5\' 9"  (1.753 m)  Weight: 105.235 kg  SpO2: 98%  Medications  bupivacaine-epinephrine (MARCAINE W/ EPI) 0.5% -1:200000 injection 1.8 mL (1.8 mLs Infiltration Given 01/23/16 1217)  amoxicillin (AMOXIL) capsule 500 mg (500 mg Oral Given 01/23/16 1217)    Douglas Sloan is 35 y.o. male presenting with dental pain associated with dental caries but no signs or symptoms of dental abscess. Patient afebrile, non toxic appearing and swallowing secretions well. I gave patient referral to dentist and stressed the importance of dental follow up for definitive management of dental issues. Patient voices understanding and is agreeable to plan.  Evaluation does not show pathology that would require ongoing emergent intervention or inpatient treatment. Pt is hemodynamically stable and mentating appropriately. Discussed findings and plan with patient/guardian, who agrees with care plan. All questions answered. Return precautions discussed and outpatient follow up given.    New Prescriptions   AMOXICILLIN (AMOXIL) 500 MG CAPSULE    Take 1 capsule (500 mg total) by mouth 3 (three) times daily.   OXYCODONE-ACETAMINOPHEN (PERCOCET/ROXICET) 5-325 MG TABLET    1 to 2 tabs PO q6hrs  PRN for pain         Monico Blitz, PA-C 01/23/16 1229  Ripley Fraise, MD 01/24/16 1027

## 2016-01-23 NOTE — Discharge Instructions (Signed)
Take percocet for breakthrough pain, do not drink alcohol, drive, care for children or do other critical tasks while taking percocet.  Return to the emergency room for fever, change in vision, redness to the face that rapidly spreads towards the eye, nausea or vomiting, difficulty swallowing or shortness of breath.   Apply warm compresses to jaw throughout the day.   Take your antibiotics as directed and to the end of the course.   Followup with a dentist is very important for ongoing evaluation and management of recurrent dental pain. Return to emergency department for emergent changing or worsening symptoms."  Low-cost dental clinic: [facility]  at [phone]**   You may also call [place] [address] is a great source of information about community services available.  Access by dialing 2-1-1 from anywhere in [place], or by website -  CustodianSupply.fi.   Other Local Resources (Updated 10/2015)  Dental  Care   Services    Phone Number and Address  Cost  [facility] For children 72 - 53 years of age:   Cleaning  Tooth brushing/flossing instruction  Sealants, fillings, crowns  Extractions  Emergency treatment  [phone] [address], [place] [address] Charges based on family income.  Medicaid and some insurance plans accepted.     [facility] - [facility], fillings, crowns  Extractions  Emergency treatment [phone] [address] [place], [place]  Pregnant women 29 years of age or older with a Medicaid card  [facility] - [place]  Cleaning  Sealants, fillings, crowns  Extractions  Emergency treatment [phone] [address] [place], [place] Pregnant women 36 years of age or older with a Medicaid card  [facility] For children 37 - 17 years of age:   Cleaning  Tooth brushing/flossing instruction  Sealants, fillings, crowns  Extractions  Emergency treatment Limited orthodontic services for patients with Medicaid [phone] [address] [place], [place] [address] Medicaid and [facility] Health Choice cover for children up to age 70 and pregnant women.  Parents of children up to age 12 without Medicaid pay a reduced fee at time of service.  [place] For children 89 - 81 years of age:   Cleaning  Tooth brushing/flossing instruction  Sealants, fillings, crowns  Extractions  Emergency treatment Limited orthodontic services for patients with Medicaid [phone] [place], [place].  Medicaid and [place] Health Choice cover for children up to age 80 and pregnant women.  Parents of children up to age 4 without Medicaid pay a reduced fee.  [facility]  Sealants, fillings, crowns  Extractions  Hours: Tuesdays and Thursdays, 4:15 - 8 pm [phone] [address], [place],  [address] Services free of charge to [facility] residents ages 18-64 who do not have health insurance, Medicare, [place], or [place] benefits and fall within federal poverty guidelines  [place] care in addition to primary medical care, nutritional counseling, and pharmacy:  Engineer, drilling, fillings, crowns  Extractions                  [phone] [facility], [place], [place], [place]  [place], [place], [facility], [place], [facility] [place], [place], [place], most insurance.  Also provides services available to all with fees adjusted based on ability to pay.    [facility]  Cleaning  Tooth brushing/flossing instruction  Sealants, fillings, crowns  Extractions  Emergency treatment Hours: Tuesdays, Thursdays, and Fridays from 8 am to 5 pm by appointment only. [phone] [place], [place] [address] [facility] residents with Medicaid (depending on eligibility) and children with [facility] Health Choice - call for more information.  [facility]  Extractions only  Hours: 2nd and 4th Thursday of each month from 6:30 am - 9 am.   [phone] ext. [place], [place] [address] Ages 7 and older only.  Patients are seen on a first come, first served basis.  [employer]  [employer]  Extractions  Orthodontics  Endodontics  Implants/Crowns/Bridges  Complete and partial dentures [phone] [place], [place] Patients must complete an application for services.  There is often a waiting list.     Dental Assistance If the dentist on-call cannot see you, please use the resources below:   Patients with Medicaid: [place] [phone] [address], [place] [address], [phone]  If unable to pay, or uninsured, contact [facility] [phone]) or [place] [phone] in [place], [place] in [facility]) to become qualified for the adult dental clinic  Other [place]- [place], [place], [place], [address]    [phone], [phone]    2nd and 4th Thursday of the month at 6:30am    10 clients each day by appointment, can sometimes see walk-in     patients if someone does not show for an appointment [place], [place], [place], [address]    [phone] [facility]- [address], [place], [place], [address]    [phone]  [place] Department- [phone] [place] [facility] Department- [phone]

## 2016-01-23 NOTE — ED Notes (Signed)
DC instructions reviewed with pt, provided list of dental clinics for follow up per MD recommendations, also reviewed the two Rx as written by EDP as well, discussed importance of taking all and completing abx and safety while taking PO narcotics, informed pt that rx are being filled here at [facility] and needs to pick up medications immediately. Opportunity for questions provided

## 2016-01-23 NOTE — ED Notes (Signed)
Rt lower jaw/ dental pain, states pain actually began 2 months ago, began to become worse this am.

## 2016-03-30 ENCOUNTER — Encounter (HOSPITAL_BASED_OUTPATIENT_CLINIC_OR_DEPARTMENT_OTHER): Payer: Self-pay | Admitting: *Deleted

## 2016-03-30 ENCOUNTER — Emergency Department (HOSPITAL_BASED_OUTPATIENT_CLINIC_OR_DEPARTMENT_OTHER)
Admission: EM | Admit: 2016-03-30 | Discharge: 2016-03-30 | Disposition: A | Discharge status: Home / Self Care | Payer: No Typology Code available for payment source | Attending: Emergency Medicine | Admitting: Emergency Medicine

## 2016-03-30 DIAGNOSIS — J45909 Unspecified asthma, uncomplicated: Secondary | ICD-10-CM | POA: Insufficient documentation

## 2016-03-30 DIAGNOSIS — K0889 Other specified disorders of teeth and supporting structures: Secondary | ICD-10-CM | POA: Insufficient documentation

## 2016-03-30 DIAGNOSIS — F319 Bipolar disorder, unspecified: Secondary | ICD-10-CM | POA: Insufficient documentation

## 2016-03-30 DIAGNOSIS — F17210 Nicotine dependence, cigarettes, uncomplicated: Secondary | ICD-10-CM | POA: Insufficient documentation

## 2016-03-30 DIAGNOSIS — F209 Schizophrenia, unspecified: Secondary | ICD-10-CM | POA: Insufficient documentation

## 2016-03-30 MED ORDER — PENICILLIN V POTASSIUM 500 MG PO TABS: 500.0000 mg | ORAL_TABLET | Freq: Four times a day (QID) | ORAL | Status: AC

## 2016-03-30 MED ORDER — BUPIVACAINE-EPINEPHRINE (PF) 0.5% -1:200000 IJ SOLN
1.8000 mL | Freq: Once | INTRAMUSCULAR | Status: AC
  Administered 2016-03-30: 1.8 mL

## 2016-03-30 MED ORDER — BUPIVACAINE-EPINEPHRINE (PF) 0.5% -1:200000 IJ SOLN
INTRAMUSCULAR | Status: AC
  Administered 2016-03-30: 1.8 mL
  Filled 2016-03-30: qty 1.8

## 2016-03-30 MED ORDER — NAPROXEN 500 MG PO TABS: 500.0000 mg | ORAL_TABLET | Freq: Two times a day (BID) | ORAL | Status: DC

## 2016-03-30 NOTE — ED Notes (Signed)
ED PA at BS 

## 2016-03-30 NOTE — ED Notes (Signed)
c/o R lower posterior molar, fractured, c/o pain, h/o similar, seen for this tooth previously, mentions "local injection and antibiotic offered temporary relief, dentist was too expensive, problem has returned", (denies: swelling, fever, nv, drainage)

## 2016-03-30 NOTE — Discharge Instructions (Signed)
Please follow up with a Dentist for further evaluation and management.   Check this website for free, low-income or sliding scale dental services in [place]. www.freedental.us   To find a dentist in the [place] or surrounding areas check this website: FinancialFreeze.is  [place] [address] is a great source of information about community services available.  Access by dialing 2-1-1 from anywhere in [place], or by website -  CustodianSupply.fi.   Other Local Resources (Updated 10/2015)  Dental  Care   Services    Phone Number and Address  Cost  [facility] For children 13 - 85 years of age:   Cleaning  Tooth brushing/flossing instruction  Sealants, fillings, crowns  Extractions  Emergency treatment  [phone] [address], [place] [address] Charges based on family income.  Medicaid and some insurance plans accepted.     [facility] - [facility], fillings, crowns  Extractions  Emergency treatment [phone], [place]  Pregnant women 67 years of age or older with a Medicaid card  [facility] - [place]  Cleaning  Sealants, fillings, crowns  Extractions  Emergency treatment [phone] [address] [place], [place] Pregnant women 58 years of age or older with a Medicaid card  [facility] For children 36 - 3 years of age:   Cleaning  Tooth brushing/flossing instruction  Sealants, fillings, crowns  Extractions  Emergency treatment Limited orthodontic services for patients with Medicaid [phone] [address], [place] [address] Medicaid and [facility] Health Choice cover for children up to age 80 and pregnant women.  Parents of children up to age 29 without Medicaid pay a  reduced fee at time of service.  [place] For children 6 - 71 years of age:   Cleaning  Tooth brushing/flossing instruction  Sealants, fillings, crowns  Extractions  Emergency treatment Limited orthodontic services for patients with Medicaid [phone] [place], [place].  Medicaid and [facility] Health Choice cover for children up to age 34 and pregnant women.  Parents of children up to age 37 without Medicaid pay a reduced fee.  [facility]  Sealants, fillings, crowns  Extractions  Hours: Tuesdays and Thursdays, 4:15 - 8 pm [phone] [address], [place], [place] [address] Services free of charge to [facility] residents ages 18-64 who do not have health insurance, Medicare, [place], or [place] benefits and fall within federal poverty guidelines  [place] care in addition to primary medical care, nutritional counseling, and pharmacy:  Engineer, drilling, fillings, crowns  Extractions                  [phone] [facility], [place], [place], [place], [place], [place] [facility], [place], [place] [facility] [place], [place], [place], most insurance.  Also provides services available to all with fees adjusted based on ability to pay.    [facility]  Cleaning  Tooth brushing/flossing instruction  Sealants, fillings, crowns  Extractions  Emergency treatment Hours: Tuesdays, Thursdays, and Fridays from 8 am to 5 pm by appointment only. [phone] [place], [place]  [address] [facility] residents with Medicaid (depending on eligibility) and children with [facility] Health Choice - call for more information.  [facility]  Extractions only  Hours: 2nd and 4th Thursday of each month from 6:30 am - 9 am.   [phone] ext. [place], [place] [address] Ages 18 and older only.  Patients are seen on a first come, first served basis.  [employer]  [employer]  Extractions  Orthodontics  Endodontics  Implants/Crowns/Bridges  Complete and partial dentures [phone] [place], [place] Patients must complete an application for services.  There is often a waiting list.   Dental Pain    Dental pain may be caused by many things, including:  Tooth decay (cavities or caries). Cavities expose the nerve of your tooth to air and hot or cold temperatures. This can cause pain or discomfort.  Abscess or infection. A dental abscess is a collection of infected pus from a bacterial infection in the inner part of the tooth (pulp). It usually occurs at the end of the tooth's root.  Injury.  An unknown reason (idiopathic). Your pain may be mild or severe. It may only occur when:  You are chewing.  You are exposed to hot or cold temperature.  You are eating or drinking sugary foods or beverages, such as soda or candy. Your pain may also be constant.  HOME CARE INSTRUCTIONS  Watch your dental pain for any changes. The following actions may help to lessen any discomfort that you are feeling:  Take medicines only as directed by your dentist.  If you were prescribed an antibiotic medicine, finish all of it even if you start to feel better.  Keep all follow-up visits as directed by your dentist. This is important.  Do not apply heat to the outside of your face.  Rinse your mouth or gargle with salt water if directed by your dentist. This helps with pain and swelling.  You can make salt water by adding  tsp of salt to 1 cup of  warm water. Apply ice to the painful area of your face:  Put ice in a plastic bag.  Place a towel between your skin and the bag.  Leave the ice on for 20 minutes, 2-3 times per day. Avoid foods or drinks that cause you pain, such as:  Very hot or very cold foods or drinks.  Sweet or sugary foods or drinks. SEEK MEDICAL CARE IF:  Your pain is not controlled with medicines.  Your symptoms are worse.  You have new symptoms. SEEK IMMEDIATE MEDICAL CARE IF:  You are unable to open your mouth.  You are having trouble breathing or swallowing.  You have a fever.  Your face, neck, or jaw is swollen. This information is not intended to replace advice given to you by your health care provider. Make sure you discuss any questions you have with your health care provider.  Document Released: 10/13/2005 Document Revised: 02/27/2015 Document Reviewed: 10/09/2014  Elsevier Interactive Patient Education [employer].

## 2016-03-30 NOTE — ED Notes (Signed)
Right sided dental pain that is ongoing.

## 2016-03-30 NOTE — ED Provider Notes (Signed)
CSN: [id number]     Arrival date & time 03/30/16  1829 History   First MD Initiated Contact with Patient 03/30/16 2126     Chief Complaint  Patient presents with  . Dental Pain     (Consider location/radiation/quality/duration/timing/severity/associated sxs/prior Treatment) HPI Patient presents for evaluation of chronic right lower molar dental pain that became worse yesterday. Patient has tried ibuprofen with minimal relief. Patient reports he did follow up with the dentist however, it was going to be $900 to extract the tooth. His symptoms are constant, moderate, and worsening. He denies fever, chills, nausea, vomiting, facial swelling, tongue swelling, or difficulty tolerating secretions.  Past Medical History  Diagnosis Date  . DVT (deep venous thrombosis) ([place])     denies taking xarelto x1 month.  states they plan to put me on some after surgery.  . Asthma     denies meds  . Schizophrenia ([place])     denies taking any meds  . Bipolar disorder ([place])     denies taking any meds   Past Surgical History  Procedure Laterality Date  . Facial cosmetic surgery      reconstructive  . Appendectomy    . Knee arthroscopy Left 08/03/2014    Procedure: LEFT ARTHROSCOPY KNEE WITH DEBRIDEMENT;  Surgeon: Sydnee Cabal, MD;  [facility];  Service: Orthopedics;  Laterality: Left;  . Anterior cruciate ligament repair Left 08/03/2014    Procedure: LEFT ALLOGRAFT ACL RECONSTRUCTION/;  Surgeon: Sydnee Cabal, MD;  Location: [facility];  Service: Orthopedics;  Laterality: Left;   Family History  Problem Relation Age of Onset  . Diabetes Other   . Heart failure Other   . Diabetes Paternal Uncle    Social History  Substance Use Topics  . Smoking status: Current Every Day Smoker -- 0.25 packs/day for 10 years    Types: Cigarettes  . Smokeless tobacco: Never Used  . Alcohol Use: No    Review of Systems All other systems negative unless otherwise stated  in HPI    Allergies  Latex  Home Medications   Prior to Admission medications   Not on File   BP 147/82 mmHg  Pulse 77  Temp(Src) 98.4 F (36.9 C) (Oral)  Resp 18  Ht 5\' 11"  (1.803 m)  Wt 102.059 kg  BMI 31.39 kg/m2  SpO2 100% Physical Exam  Constitutional: He is oriented to person, place, and time. He appears well-developed and well-nourished.  HENT:  Head: Normocephalic and atraumatic.  Mouth/Throat: Uvula is midline, oropharynx is clear and moist and mucous membranes are normal. No trismus in the jaw. Abnormal dentition. Dental caries present.  No tongue swelling or facial swelling.  Airway patent. Patient tolerating secretions without difficulty. Right lower molar #32 cracked.  No nerve root exposure.  Eyes: Conjunctivae are normal.  Neck: Normal range of motion. Neck supple.  No evidence of Ludwigs angina.  Cardiovascular: Normal rate, regular rhythm and normal heart sounds.   Pulmonary/Chest: Effort normal and breath sounds normal.  Abdominal: Bowel sounds are normal. He exhibits no distension.  Musculoskeletal:  Moves all extremities spontaneously.  Lymphadenopathy:    He has no cervical adenopathy.  Neurological: He is alert and oriented to person, place, and time.  Speech clear without dysarthria.   Skin: Skin is warm and dry.    ED Course  Procedures (including critical care time)  NERVE BLOCK Performed by: Gloriann Loan Consent: Verbal consent obtained. Required items: required blood products, implants, devices, and special equipment  available Time out: Immediately prior to procedure a "time out" was called to verify the correct patient, procedure, equipment, support staff and site/side marked as required.  Indication: Dental pain Nerve block body site: right infraalveolar   Preparation: Patient was prepped and draped in the usual sterile fashion. Needle gauge: 23 G Location technique: anatomical landmarks  Local anesthetic: Marcaine 0.5% with  epinephrine  Anesthetic total: 1.8 ml  Outcome: pain improved Patient tolerance: Patient tolerated the procedure well with no immediate complications.  Labs Review Labs Reviewed - No data to display  Imaging Review No results found. I have personally reviewed and evaluated these images and lab results as part of my medical decision-making.   EKG Interpretation None      MDM   Final diagnoses:  Dentalgia   Suspect dental pain associated with dental infection and possible dental abscess with patient afebrile, non toxic appearing and swallowing secretions well. I gave patient referral to dentist and stressed the importance of dental follow up for ultimate management of dental pain. Discussed return precautions.  Patient expresses understanding and agrees with plan.  I will also give penicillin VK and pain control.      Gloriann Loan, PA-C 03/30/16 [place], MD 03/31/16 [phone]

## 2016-04-22 ENCOUNTER — Encounter (HOSPITAL_BASED_OUTPATIENT_CLINIC_OR_DEPARTMENT_OTHER): Payer: Self-pay | Admitting: *Deleted

## 2016-04-22 ENCOUNTER — Emergency Department (HOSPITAL_BASED_OUTPATIENT_CLINIC_OR_DEPARTMENT_OTHER): Payer: Self-pay

## 2016-04-22 ENCOUNTER — Emergency Department (HOSPITAL_BASED_OUTPATIENT_CLINIC_OR_DEPARTMENT_OTHER)
Admission: EM | Admit: 2016-04-22 | Discharge: 2016-04-22 | Disposition: A | Discharge status: Home / Self Care | Payer: Self-pay | Attending: Emergency Medicine | Admitting: Emergency Medicine

## 2016-04-22 DIAGNOSIS — S62364A Nondisplaced fracture of neck of fourth metacarpal bone, right hand, initial encounter for closed fracture: Secondary | ICD-10-CM | POA: Insufficient documentation

## 2016-04-22 DIAGNOSIS — F17210 Nicotine dependence, cigarettes, uncomplicated: Secondary | ICD-10-CM | POA: Insufficient documentation

## 2016-04-22 DIAGNOSIS — S62309A Unspecified fracture of unspecified metacarpal bone, initial encounter for closed fracture: Secondary | ICD-10-CM

## 2016-04-22 DIAGNOSIS — Y999 Unspecified external cause status: Secondary | ICD-10-CM | POA: Insufficient documentation

## 2016-04-22 DIAGNOSIS — Y939 Activity, unspecified: Secondary | ICD-10-CM | POA: Insufficient documentation

## 2016-04-22 DIAGNOSIS — Y92149 Unspecified place in prison as the place of occurrence of the external cause: Secondary | ICD-10-CM | POA: Insufficient documentation

## 2016-04-22 DIAGNOSIS — W2201XA Walked into wall, initial encounter: Secondary | ICD-10-CM | POA: Insufficient documentation

## 2016-04-22 DIAGNOSIS — J45909 Unspecified asthma, uncomplicated: Secondary | ICD-10-CM | POA: Insufficient documentation

## 2016-04-22 DIAGNOSIS — F209 Schizophrenia, unspecified: Secondary | ICD-10-CM | POA: Insufficient documentation

## 2016-04-22 DIAGNOSIS — F319 Bipolar disorder, unspecified: Secondary | ICD-10-CM | POA: Insufficient documentation

## 2016-04-22 MED ORDER — NAPROXEN 500 MG PO TABS: 500.0000 mg | ORAL_TABLET | Freq: Two times a day (BID) | ORAL | Status: DC

## 2016-04-22 MED ORDER — NAPROXEN 250 MG PO TABS
500.0000 mg | ORAL_TABLET | Freq: Once | ORAL | Status: AC
  Administered 2016-04-22: 500 mg via ORAL
  Filled 2016-04-22: qty 2
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
## 2016-04-22 NOTE — Discharge Instructions (Signed)
Call the hand surgeon listed in the morning to schedule follow up appointment. Take Naproxen twice daily as needed for pain. Return to ER for new or worsening symptoms, any additional concerns.
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
## 2016-04-22 NOTE — ED Notes (Signed)
Pt awaiting to be seen by PA. Pt updated on reason for wait. Pt c/o pain in hand.

## 2016-04-22 NOTE — ED Notes (Signed)
PA at bedside.

## 2017-02-20 ENCOUNTER — Emergency Department (HOSPITAL_BASED_OUTPATIENT_CLINIC_OR_DEPARTMENT_OTHER): Payer: Self-pay

## 2017-02-20 ENCOUNTER — Emergency Department (HOSPITAL_BASED_OUTPATIENT_CLINIC_OR_DEPARTMENT_OTHER)
Admission: EM | Admit: 2017-02-20 | Discharge: 2017-02-20 | Disposition: A | Discharge status: Home / Self Care | Payer: Self-pay | Attending: Emergency Medicine | Admitting: Emergency Medicine

## 2017-02-20 ENCOUNTER — Encounter (HOSPITAL_BASED_OUTPATIENT_CLINIC_OR_DEPARTMENT_OTHER): Payer: Self-pay | Admitting: Emergency Medicine

## 2017-02-20 DIAGNOSIS — R609 Edema, unspecified: Secondary | ICD-10-CM

## 2017-02-20 DIAGNOSIS — F17210 Nicotine dependence, cigarettes, uncomplicated: Secondary | ICD-10-CM | POA: Insufficient documentation

## 2017-02-20 DIAGNOSIS — J45909 Unspecified asthma, uncomplicated: Secondary | ICD-10-CM | POA: Insufficient documentation

## 2017-02-20 DIAGNOSIS — R0602 Shortness of breath: Secondary | ICD-10-CM

## 2017-02-20 DIAGNOSIS — M7989 Other specified soft tissue disorders: Secondary | ICD-10-CM | POA: Insufficient documentation

## 2017-02-20 DIAGNOSIS — M79621 Pain in right upper arm: Secondary | ICD-10-CM

## 2017-02-20 DIAGNOSIS — K0889 Other specified disorders of teeth and supporting structures: Secondary | ICD-10-CM

## 2017-02-20 DIAGNOSIS — R0789 Other chest pain: Secondary | ICD-10-CM

## 2017-02-20 LAB — CBC WITH DIFFERENTIAL/PLATELET
BASOS PCT: 0 %
Basophils Absolute: 0 10*3/uL (ref 0.0–0.1)
Eosinophils Absolute: 0.1 10*3/uL (ref 0.0–0.7)
Eosinophils Relative: 1 %
HEMATOCRIT: 42.3 % (ref 39.0–52.0)
HEMOGLOBIN: 14.4 g/dL (ref 13.0–17.0)
LYMPHS ABS: 2.3 10*3/uL (ref 0.7–4.0)
LYMPHS PCT: 21 %
MCH: 29.9 pg (ref 26.0–34.0)
MCHC: 34 g/dL (ref 30.0–36.0)
MCV: 87.9 fL (ref 78.0–100.0)
MONOS PCT: 9 %
Monocytes Absolute: 0.9 10*3/uL (ref 0.1–1.0)
NEUTROS ABS: 7.6 10*3/uL (ref 1.7–7.7)
NEUTROS PCT: 69 %
Platelets: 294 10*3/uL (ref 150–400)
RBC: 4.81 MIL/uL (ref 4.22–5.81)
RDW: 13.1 % (ref 11.5–15.5)
WBC: 10.9 10*3/uL — ABNORMAL HIGH (ref 4.0–10.5)

## 2017-02-20 LAB — COMPREHENSIVE METABOLIC PANEL
ALBUMIN: 3.5 g/dL (ref 3.5–5.0)
ALK PHOS: 57 U/L (ref 38–126)
ALT: 39 U/L (ref 17–63)
ANION GAP: 9 (ref 5–15)
AST: 28 U/L (ref 15–41)
BILIRUBIN TOTAL: 0.6 mg/dL (ref 0.3–1.2)
BUN: 9 mg/dL (ref 6–20)
CALCIUM: 8.8 mg/dL — AB (ref 8.9–10.3)
CO2: 25 mmol/L (ref 22–32)
Chloride: 107 mmol/L (ref 101–111)
Creatinine, Ser: 0.91 mg/dL (ref 0.61–1.24)
GFR calc Af Amer: >60 mL/min (ref >60)
GLUCOSE: 79 mg/dL (ref 65–99)
Potassium: 3.9 mmol/L (ref 3.5–5.1)
Sodium: 141 mmol/L (ref 135–145)
TOTAL PROTEIN: 6.2 g/dL — AB (ref 6.5–8.1)

## 2017-02-20 LAB — TROPONIN I: Troponin I: <0.03 ng/mL (ref <0.03)

## 2017-02-20 MED ORDER — AMOXICILLIN-POT CLAVULANATE 875-125 MG PO TABS: 1.0000 | ORAL_TABLET | Freq: Two times a day (BID) | ORAL | 0 refills | Status: AC

## 2017-02-20 MED ORDER — HYDROCODONE-ACETAMINOPHEN 5-325 MG PO TABS
2.0000 | ORAL_TABLET | Freq: Once | ORAL | Status: AC
  Administered 2017-02-20: 2 via ORAL
  Filled 2017-02-20: qty 2

## 2017-02-20 NOTE — ED Notes (Signed)
Patient transported to Ultrasound 

## 2017-02-20 NOTE — ED Notes (Signed)
Pt's ride is present -- Therapist, sports notified.

## 2017-02-20 NOTE — ED Notes (Signed)
ED Provider at bedside. 
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
## 2017-02-20 NOTE — ED Notes (Signed)
Pt's ride not present at this time. Discussed with pt need for ride d/t narcotic order. When ride arrives, pt will notify staff. He agrees to wait in waiting room until that time.

## 2017-02-20 NOTE — Discharge Instructions (Signed)
As discussed, follow up with Dr. Hoyt Koch for your dental pain listed on this paper. Use ibuprofen for pain in the meantime. Follow up with a primary care provider for annual checkups. Take your entire antibiotics even if you feel better.  Return to the emergency department if you experience difficulty swallowing, facial swelling, fever, chills or other concerning symptoms.

## 2017-02-20 NOTE — ED Triage Notes (Signed)
Pt also c/o right arm pain x 2weeks

## 2017-02-20 NOTE — ED Triage Notes (Signed)
Dental pain x 1 year

## 2017-04-24 ENCOUNTER — Emergency Department (HOSPITAL_BASED_OUTPATIENT_CLINIC_OR_DEPARTMENT_OTHER)
Admission: EM | Admit: 2017-04-24 | Discharge: 2017-04-24 | Disposition: A | Discharge status: Home / Self Care | Payer: Self-pay | Attending: Emergency Medicine | Admitting: Emergency Medicine

## 2017-04-24 ENCOUNTER — Encounter (HOSPITAL_BASED_OUTPATIENT_CLINIC_OR_DEPARTMENT_OTHER): Payer: Self-pay | Admitting: Emergency Medicine

## 2017-04-24 ENCOUNTER — Emergency Department (HOSPITAL_BASED_OUTPATIENT_CLINIC_OR_DEPARTMENT_OTHER): Payer: Self-pay

## 2017-04-24 DIAGNOSIS — Y939 Activity, unspecified: Secondary | ICD-10-CM | POA: Insufficient documentation

## 2017-04-24 DIAGNOSIS — S46002A Unspecified injury of muscle(s) and tendon(s) of the rotator cuff of left shoulder, initial encounter: Secondary | ICD-10-CM | POA: Insufficient documentation

## 2017-04-24 DIAGNOSIS — X58XXXA Exposure to other specified factors, initial encounter: Secondary | ICD-10-CM | POA: Insufficient documentation

## 2017-04-24 DIAGNOSIS — Y999 Unspecified external cause status: Secondary | ICD-10-CM | POA: Insufficient documentation

## 2017-04-24 DIAGNOSIS — Y929 Unspecified place or not applicable: Secondary | ICD-10-CM | POA: Insufficient documentation

## 2017-04-24 DIAGNOSIS — J45909 Unspecified asthma, uncomplicated: Secondary | ICD-10-CM | POA: Insufficient documentation

## 2017-04-24 DIAGNOSIS — F17210 Nicotine dependence, cigarettes, uncomplicated: Secondary | ICD-10-CM | POA: Insufficient documentation

## 2017-04-24 MED ORDER — TRAMADOL HCL 50 MG PO TABS: 50.0000 mg | ORAL_TABLET | Freq: Four times a day (QID) | ORAL | 0 refills | Status: DC | PRN

## 2017-04-24 MED ORDER — NAPROXEN 375 MG PO TABS: 375.0000 mg | ORAL_TABLET | Freq: Two times a day (BID) | ORAL | 0 refills | Status: AC

## 2017-04-24 MED ORDER — CYCLOBENZAPRINE HCL 10 MG PO TABS: 10.0000 mg | ORAL_TABLET | Freq: Three times a day (TID) | ORAL | 0 refills | Status: DC | PRN

## 2017-04-24 MED ORDER — HYDROCODONE-ACETAMINOPHEN 5-325 MG PO TABS
2.0000 | ORAL_TABLET | Freq: Once | ORAL | Status: AC
  Administered 2017-04-24: 2 via ORAL
  Filled 2017-04-24: qty 2

## 2017-04-24 NOTE — ED Notes (Signed)
Patient states that he has had left shoulder pain with movement x "months" - patient states that today he had time to get it checked out. Wondering if he needs an MRI.

## 2017-04-24 NOTE — ED Provider Notes (Signed)
[facility] DEPT [facility] Provider Note   CSN: [id number] Arrival date & time: 04/24/17  1937   By signing my name below, I, Eunice Blase, attest that this documentation has been prepared under the direction and in the presence of Duffy Bruce, MD. Electronically signed, Eunice Blase, ED Scribe. 04/24/17. 7:28 PM.   History   Chief Complaint Chief Complaint  Patient presents with  . Shoulder Pain   The history is provided by the patient and medical records. No language interpreter was used.    Douglas Sloan is a 36 y.o. male presenting to the Emergency Department concerning L shoulder pain 2-3 months. Tingling reported in all fingertips of L hand. He states his shoulder felt like it was popping when moved before it gradually began to feel like it "dropped" recently. He states his L shoulder feels lower than the other. Pt describes fluctuating, moderate, needle-like, pressure-like, aching pain that is worse with certain movements and palpation. No relief with tylenol, ibuprofen. States he works for [employer] and [employer] where he performs a lot of lifting. Pt denies any known h/o trauma or injury. No neck pain or numbness.  Past Medical History:  Diagnosis Date  . Asthma    denies meds  . Bipolar disorder ([place])    denies taking any meds  . DVT (deep venous thrombosis) ([place])    denies taking xarelto x1 month.  states they plan to put me on some after surgery.  . Schizophrenia [facility])    denies taking any meds    Patient Active Problem List   Diagnosis Date Noted  . S/P ACL reconstruction 08/03/2014  . Unspecified vitamin D deficiency 05/02/2014  . Left leg DVT ([place]) 04/03/2014  . Left fibular fracture 04/03/2014  . Unspecified asthma(493.90) 04/03/2014  . Smoking 04/03/2014  . Leg pain 04/03/2014    Past Surgical History:  Procedure Laterality Date  . ANTERIOR CRUCIATE LIGAMENT REPAIR Left 08/03/2014   Procedure: LEFT ALLOGRAFT ACL RECONSTRUCTION/;  Surgeon: Sydnee Cabal,  MD;  Location: [facility];  Service: Orthopedics;  Laterality: Left;  . APPENDECTOMY    . FACIAL COSMETIC SURGERY     reconstructive  . KNEE ARTHROSCOPY Left 08/03/2014   Procedure: LEFT ARTHROSCOPY KNEE WITH DEBRIDEMENT;  Surgeon: Sydnee Cabal, MD;  Location: [facility];  Service: Orthopedics;  Laterality: Left;       Home Medications    Prior to Admission medications   Medication Sig Start Date End Date Taking? Authorizing Provider  cyclobenzaprine (FLEXERIL) 10 MG tablet Take 1 tablet (10 mg total) by mouth 3 (three) times daily as needed for muscle spasms. 04/24/17   Duffy Bruce, MD  naproxen (NAPROSYN) 375 MG tablet Take 1 tablet (375 mg total) by mouth 2 (two) times daily with a meal. 04/24/17 05/01/17  Duffy Bruce, MD  traMADol (ULTRAM) 50 MG tablet Take 1 tablet (50 mg total) by mouth every 6 (six) hours as needed for severe pain. 04/24/17   Duffy Bruce, MD    Family History Family History  Problem Relation Age of Onset  . Diabetes Other   . Heart failure Other   . Diabetes Paternal Uncle     Social History Social History  Substance Use Topics  . Smoking status: Current Every Day Smoker    Packs/day: 0.50    Years: 10.00    Types: Cigarettes  . Smokeless tobacco: Never Used  . Alcohol use No     Allergies   Latex   Review of Systems  Review of Systems  Musculoskeletal: Positive for arthralgias. Negative for neck pain and neck stiffness.  Skin: Negative for color change and wound.  Neurological: Negative for weakness and numbness.  All other systems reviewed and are negative.    Physical Exam Updated Vital Signs BP 139/75 (BP Location: Right Arm)   Pulse 67   Temp 98.5 F (36.9 C) (Oral)   Resp 18   Ht 5\' 11"  (1.803 m)   Wt 223 lb (101.2 kg)   SpO2 100%   BMI 31.10 kg/m   Physical Exam  Constitutional: He is oriented to person, place, and time. He appears well-developed and well-nourished. No distress.    HENT:  Head: Normocephalic and atraumatic.  Eyes: Conjunctivae are normal.  Neck: Neck supple.  Cardiovascular: Normal rate, regular rhythm and normal heart sounds.   Pulmonary/Chest: Effort normal. No respiratory distress. He has no wheezes.  Abdominal: He exhibits no distension.  Musculoskeletal: He exhibits no edema.  Neurological: He is alert and oriented to person, place, and time. He exhibits normal muscle tone.  Skin: Skin is warm. Capillary refill takes less than 2 seconds. No rash noted.  Nursing note and vitals reviewed.   UPPER EXTREMITY EXAM: LEFT  INSPECTION & PALPATION: Mild TTP over anterior GH joint, with no deformity or bruising. ROM full but with significant tenderness upon internal rotation and flexion at shoulder. No impingement.  SENSORY: Sensation is intact to light touch in:  Superficial radial nerve distribution (dorsal first web space) Median nerve distribution (tip of index finger)   Ulnar nerve distribution (tip of small finger)     MOTOR:  + Motor posterior interosseous nerve (thumb IP extension) + Anterior interosseous nerve (thumb IP flexion, index finger DIP flexion) + Radial nerve (wrist extension) + Median nerve (palpable firing thenar mass) + Ulnar nerve (palpable firing of first dorsal interosseous muscle)  VASCULAR: 2+ radial pulse Brisk capillary refill < 2 sec, fingers warm and well-perfused   ED Treatments / Results  DIAGNOSTIC STUDIES: Oxygen Saturation is 100% on RA, NL by my interpretation.    COORDINATION OF CARE: 7:28 PM-Discussed next steps with pt. Pt verbalized understanding and is agreeable with the plan. Will Rx medications and refer to MRI.   Labs (all labs ordered are listed, but only abnormal results are displayed) Labs Reviewed - No data to display  EKG  EKG Interpretation None       Radiology Dg Shoulder Left  Result Date: 04/24/2017 CLINICAL DATA:  LEFT shoulder pain, recurrent subluxation/ dislocation.  EXAM: LEFT SHOULDER - 2+ VIEW COMPARISON:  None. FINDINGS: The humeral head is well-formed and located. The subacromial, glenohumeral and acromioclavicular joint spaces are intact. No destructive bony lesions. Soft tissue planes are non-suspicious. IMPRESSION: Negative. Electronically Signed   By: Elon Alas M.D.   On: 04/24/2017 19:23    Procedures Procedures (including critical care time)  Medications Ordered in ED Medications  HYDROcodone-acetaminophen (NORCO/VICODIN) 5-325 MG per tablet 2 tablet (2 tablets Oral Given 04/24/17 1947)     Initial Impression / Assessment and Plan / ED Course  I have reviewed the triage vital signs and the nursing notes.  Pertinent labs & imaging results that were available during my care of the patient were reviewed by me and considered in my medical decision making (see chart for details).     36 year old right-hand dominant male here with recurrent left shoulder pain. I suspect patient has mild rotator cuff injury with significant pain upon internal rotation as well as flexion  at the shoulder. He has no evidence of significant ligamentous laxity on my exam. The shoulder is located with no abnormalities on plain films. Axillary and distal neurovasculature is fully intact. Will advise supportive care, avoidance of heavy lifting, and referto outpatient orthopedics, for possible further workup and MRI if needed.  Final Clinical Impressions(s) / ED Diagnoses   Final diagnoses:  Injury of left rotator cuff, initial encounter    New Prescriptions Discharge Medication List as of 04/24/2017  7:30 PM    START taking these medications   Details  cyclobenzaprine (FLEXERIL) 10 MG tablet Take 1 tablet (10 mg total) by mouth 3 (three) times daily as needed for muscle spasms., Starting Fri 04/24/2017, Print    traMADol (ULTRAM) 50 MG tablet Take 1 tablet (50 mg total) by mouth every 6 (six) hours as needed for severe pain., Starting Fri 04/24/2017, Print         I personally performed the services described in this documentation, which was scribed in my presence. The recorded information has been reviewed and is accurate.    Duffy Bruce, MD 04/24/17 2330

## 2017-04-27 ENCOUNTER — Ambulatory Visit (INDEPENDENT_AMBULATORY_CARE_PROVIDER_SITE_OTHER): Payer: Self-pay | Admitting: Family Medicine

## 2017-04-27 DIAGNOSIS — M25512 Pain in left shoulder: Secondary | ICD-10-CM

## 2017-04-27 DIAGNOSIS — G8929 Other chronic pain: Secondary | ICD-10-CM

## 2017-04-27 NOTE — Patient Instructions (Signed)
You have rotator cuff impingement Try to avoid painful activities (overhead activities, lifting with extended arm) as much as possible. Aleve 2 tabs twice a day with food OR ibuprofen 3 tabs three times a day with food for pain and inflammation. Can take tylenol in addition to this. Subacromial injection may be beneficial to help with pain and to decrease inflammation - you were given this today. Consider physical therapy with transition to home exercise program. Do home exercise program with theraband and scapular stabilization exercises daily - these are very important for long term relief even if an injection was given. 3 sets of 10 once a day - typically wait a few days before starting these. If not improving at follow-up we will consider further imaging, physical therapy, and/or nitro patches. Follow up with me in 1 month for reevaluation.

## 2017-04-28 DIAGNOSIS — M25512 Pain in left shoulder: Secondary | ICD-10-CM | POA: Insufficient documentation

## 2017-04-28 MED ORDER — METHYLPREDNISOLONE ACETATE 40 MG/ML IJ SUSP
40.0000 mg | Freq: Once | INTRAMUSCULAR | Status: AC
  Administered 2017-04-28: 40 mg via INTRA_ARTICULAR

## 2017-04-28 NOTE — Assessment & Plan Note (Signed)
2/2 rotator cuff impingement.  Aleve or ibuprofen.  Reviewed home exercises to do daily after a few days.  Subacromial injection given today.  F/u in 1 month.  Consider imaging, PT, nitro patches if not improving.  After informed written consent, patient was seated on exam table. Left shoulder was prepped with alcohol swab and utilizing posterior approach, patient's left subacromial space was injected with 3:1 bupivicaine: depomedrol. Patient tolerated the procedure well without immediate complications.

## 2017-04-28 NOTE — Progress Notes (Signed)
PCP: Patient, No Pcp Per  Subjective:   HPI: Patient is a 36 y.o. male here for left shoulder pain.  Patient reports he's had about 6 months of anterior to posterior left shoulder pain. No acute injury or trauma. Pain level 7/10 currently but up to 9/10 and sharp. Worse trying to sleep. Recalls this started around when he was working out and doing dumbbell fly exercises. He tried tramadol, rest. No skin changes, numbness.  Past Medical History:  Diagnosis Date  . Asthma    denies meds  . Bipolar disorder ([place])    denies taking any meds  . DVT (deep venous thrombosis) ([place])    denies taking xarelto x1 month.  states they plan to put me on some after surgery.  . Schizophrenia ([place])    denies taking any meds    Current Outpatient Prescriptions on File Prior to Visit  Medication Sig Dispense Refill  . cyclobenzaprine (FLEXERIL) 10 MG tablet Take 1 tablet (10 mg total) by mouth 3 (three) times daily as needed for muscle spasms. 20 tablet 0  . naproxen (NAPROSYN) 375 MG tablet Take 1 tablet (375 mg total) by mouth 2 (two) times daily with a meal. 14 tablet 0  . traMADol (ULTRAM) 50 MG tablet Take 1 tablet (50 mg total) by mouth every 6 (six) hours as needed for severe pain. 15 tablet 0   No current facility-administered medications on file prior to visit.     Past Surgical History:  Procedure Laterality Date  . ANTERIOR CRUCIATE LIGAMENT REPAIR Left 08/03/2014   Procedure: LEFT ALLOGRAFT ACL RECONSTRUCTION/;  Surgeon: Sydnee Cabal, MD;  Location: [facility];  Service: Orthopedics;  Laterality: Left;  . APPENDECTOMY    . FACIAL COSMETIC SURGERY     reconstructive  . KNEE ARTHROSCOPY Left 08/03/2014   Procedure: LEFT ARTHROSCOPY KNEE WITH DEBRIDEMENT;  Surgeon: Sydnee Cabal, MD;  Location: [facility];  Service: Orthopedics;  Laterality: Left;    Allergies  Allergen Reactions  . Latex Hives    Social History   Social History  . Marital  status: Single    Spouse name: N/A  . Number of children: N/A  . Years of education: N/A   Occupational History  . Not on file.   Social History Main Topics  . Smoking status: Current Every Day Smoker    Packs/day: 0.50    Years: 10.00    Types: Cigarettes  . Smokeless tobacco: Never Used  . Alcohol use No  . Drug use: No     Comment: pt denies  . Sexual activity: Not on file   Other Topics Concern  . Not on file   Social History Narrative  . No narrative on file    Family History  Problem Relation Age of Onset  . Diabetes Other   . Heart failure Other   . Diabetes Paternal Uncle     There were no vitals taken for this visit.  Review of Systems: See HPI above.     Objective:  Physical Exam:  Gen: NAD, comfortable in exam room  Left shoulder: No swelling, ecchymoses.  No gross deformity. No TTP. FROM with painful arc. Positive Hawkins, Neers. Negative Yergasons. Strength 5/5 with empty can and resisted internal/external rotation.  Pain empty can. Negative apprehension. NV intact distally.  Right shoulder: FROM without pain.   Assessment & Plan:  1. Left shoulder pain - 2/2 rotator cuff impingement.  Aleve or ibuprofen.  Reviewed home exercises to do  daily after a few days.  Subacromial injection given today.  F/u in 1 month.  Consider imaging, PT, nitro patches if not improving.  After informed written consent, patient was seated on exam table. Left shoulder was prepped with alcohol swab and utilizing posterior approach, patient's left subacromial space was injected with 3:1 bupivicaine: depomedrol. Patient tolerated the procedure well without immediate complications.

## 2017-05-28 ENCOUNTER — Ambulatory Visit (INDEPENDENT_AMBULATORY_CARE_PROVIDER_SITE_OTHER): Payer: Self-pay | Admitting: Family Medicine

## 2017-05-28 ENCOUNTER — Encounter: Payer: Self-pay | Admitting: Family Medicine

## 2017-05-28 DIAGNOSIS — M25511 Pain in right shoulder: Secondary | ICD-10-CM | POA: Insufficient documentation

## 2017-05-28 DIAGNOSIS — M25522 Pain in left elbow: Secondary | ICD-10-CM | POA: Insufficient documentation

## 2017-05-28 DIAGNOSIS — M25521 Pain in right elbow: Secondary | ICD-10-CM | POA: Insufficient documentation

## 2017-05-28 DIAGNOSIS — M25512 Pain in left shoulder: Secondary | ICD-10-CM

## 2017-05-28 DIAGNOSIS — G8929 Other chronic pain: Secondary | ICD-10-CM

## 2017-05-28 NOTE — Assessment & Plan Note (Signed)
exam reassuring.  Consistent with overuse synovitis of elbow.  Aleve or ibuprofen.  Consider physical therapy when cone coverage goes through.

## 2017-05-28 NOTE — Assessment & Plan Note (Signed)
2/2 rotator cuff impingement.  Rest of exam reassuring.  No improvement with injection and not doing exercises because of pain.  Encouraged to get cone coverage so we can proceed with physical therapy and/or MRI.

## 2017-05-28 NOTE — Progress Notes (Signed)
PCP: Patient, No Pcp Per  Subjective:   HPI: Patient is a 36 y.o. male here for left shoulder pain.  7/2: Patient reports he's had about 6 months of anterior to posterior left shoulder pain. No acute injury or trauma. Pain level 7/10 currently but up to 9/10 and sharp. Worse trying to sleep. Recalls this started around when he was working out and doing dumbbell fly exercises. He tried tramadol, rest. No skin changes, numbness.  8/2: Patient reports injection did not help left shoulder. He tried doing exercises but pain worsened. Pain level up to 6/10 and sharp. Worse with reaching overhead and carrying heavy things (even over 10 pounds). Reports work has not kept him on his light duty restrictions and now right shoulder and elbow bothering hip. Improved with massage, feels like a stiffness in the elbow. No benefit with tramadol. No skin changes, numbness.  Past Medical History:  Diagnosis Date  . Asthma    denies meds  . Bipolar disorder ([place])    denies taking any meds  . DVT (deep venous thrombosis) ([place])    denies taking xarelto x1 month.  states they plan to put me on some after surgery.  . Schizophrenia ([place])    denies taking any meds    Current Outpatient Prescriptions on File Prior to Visit  Medication Sig Dispense Refill  . cyclobenzaprine (FLEXERIL) 10 MG tablet Take 1 tablet (10 mg total) by mouth 3 (three) times daily as needed for muscle spasms. 20 tablet 0  . traMADol (ULTRAM) 50 MG tablet Take 1 tablet (50 mg total) by mouth every 6 (six) hours as needed for severe pain. 15 tablet 0   No current facility-administered medications on file prior to visit.     Past Surgical History:  Procedure Laterality Date  . ANTERIOR CRUCIATE LIGAMENT REPAIR Left 08/03/2014   Procedure: LEFT ALLOGRAFT ACL RECONSTRUCTION/;  Surgeon: Sydnee Cabal, MD;  Location: [facility];  Service: Orthopedics;  Laterality: Left;  . APPENDECTOMY    . FACIAL COSMETIC  SURGERY     reconstructive  . KNEE ARTHROSCOPY Left 08/03/2014   Procedure: LEFT ARTHROSCOPY KNEE WITH DEBRIDEMENT;  Surgeon: Sydnee Cabal, MD;  Location: [facility];  Service: Orthopedics;  Laterality: Left;    Allergies  Allergen Reactions  . Latex Hives    Social History   Social History  . Marital status: Single    Spouse name: N/A  . Number of children: N/A  . Years of education: N/A   Occupational History  . Not on file.   Social History Main Topics  . Smoking status: Current Every Day Smoker    Packs/day: 0.50    Years: 10.00    Types: Cigarettes  . Smokeless tobacco: Never Used  . Alcohol use No  . Drug use: No     Comment: pt denies  . Sexual activity: Not on file   Other Topics Concern  . Not on file   Social History Narrative  . No narrative on file    Family History  Problem Relation Age of Onset  . Diabetes Other   . Heart failure Other   . Diabetes Paternal Uncle     BP 119/75   Pulse 75   Ht 5\' 11"  (1.803 m)   Wt 222 lb (100.7 kg)   BMI 30.96 kg/m   Review of Systems: See HPI above.     Objective:  Physical Exam:  Gen: NAD, comfortable in exam room  Left shoulder:  No swelling, ecchymoses.  No gross deformity. No TTP. FROM with painful arc. Mildly positive Hawkins, Neers. Negative Yergasons. Strength 5/5 with empty can and resisted internal/external rotation. Negative apprehension. NV intact distally.  Right shoulder: No swelling, ecchymoses.  No gross deformity. No TTP. FROM. Mildly positive Hawkins, Neers. Negative Yergasons. Strength 5/5 with empty can and resisted internal/external rotation. Negative apprehension. NV intact distally.  Right elbow: No gross deformity, swelling, bruising. No TTP. Collateral ligaments intact. 5/5 strength with flexion and extension.  No pain on wrist or elbow resisted motions. NVI distally.  Assessment & Plan:  1. Left shoulder pain - 2/2 rotator cuff  impingement.  Rest of exam reassuring.  No improvement with injection and not doing exercises because of pain.  Encouraged to get cone coverage so we can proceed with physical therapy and/or MRI.    2. Right shoulder pain - 2/2 impingement as well.  Aleve or ibuprofen, home exercises.  Consider physical therapy when he gets cone coverage.  3. Right elbow pain - exam reassuring.  Consistent with overuse synovitis of elbow.  Aleve or ibuprofen.  Consider physical therapy when cone coverage goes through.

## 2017-05-28 NOTE — Patient Instructions (Signed)
Get the cone coverage - when this goes through call us and bring the letter for [place] to scan this in. This is important so we can go ahead with physical therapy and MRI. You have rotator cuff impingement Try to avoid painful activities (overhead activities, lifting with extended arm) as much as possible. Aleve 2 tabs twice a day with food OR ibuprofen 3 tabs three times a day with food for pain and inflammation. Can take tylenol in addition to this. Subacromial injection may be beneficial to help with pain and to decrease inflammation- this didn't help so I wouldn't repeat it or do it on the right side. Consider physical therapy with transition to home exercise program. Do home exercise program with theraband and scapular stabilization exercises daily - 3 sets of 10 once a day

## 2017-05-28 NOTE — Assessment & Plan Note (Signed)
2/2 impingement as well.  Aleve or ibuprofen, home exercises.  Consider physical therapy when he gets cone coverage.

## 2017-05-29 ENCOUNTER — Emergency Department (HOSPITAL_BASED_OUTPATIENT_CLINIC_OR_DEPARTMENT_OTHER)
Admission: EM | Admit: 2017-05-29 | Discharge: 2017-05-29 | Disposition: A | Discharge status: Home / Self Care | Payer: Self-pay | Attending: Emergency Medicine | Admitting: Emergency Medicine

## 2017-05-29 ENCOUNTER — Encounter (HOSPITAL_BASED_OUTPATIENT_CLINIC_OR_DEPARTMENT_OTHER): Payer: Self-pay

## 2017-05-29 ENCOUNTER — Emergency Department (HOSPITAL_BASED_OUTPATIENT_CLINIC_OR_DEPARTMENT_OTHER): Payer: Self-pay

## 2017-05-29 DIAGNOSIS — M25511 Pain in right shoulder: Secondary | ICD-10-CM | POA: Insufficient documentation

## 2017-05-29 DIAGNOSIS — F17210 Nicotine dependence, cigarettes, uncomplicated: Secondary | ICD-10-CM | POA: Insufficient documentation

## 2017-05-29 DIAGNOSIS — J45909 Unspecified asthma, uncomplicated: Secondary | ICD-10-CM | POA: Insufficient documentation

## 2017-05-29 DIAGNOSIS — X500XXA Overexertion from strenuous movement or load, initial encounter: Secondary | ICD-10-CM | POA: Insufficient documentation

## 2017-05-29 DIAGNOSIS — R071 Chest pain on breathing: Secondary | ICD-10-CM | POA: Insufficient documentation

## 2017-05-29 DIAGNOSIS — Y990 Civilian activity done for income or pay: Secondary | ICD-10-CM | POA: Insufficient documentation

## 2017-05-29 DIAGNOSIS — M5412 Radiculopathy, cervical region: Secondary | ICD-10-CM | POA: Insufficient documentation

## 2017-05-29 DIAGNOSIS — Y9389 Activity, other specified: Secondary | ICD-10-CM | POA: Insufficient documentation

## 2017-05-29 DIAGNOSIS — Z91040 Latex allergy status: Secondary | ICD-10-CM | POA: Insufficient documentation

## 2017-05-29 DIAGNOSIS — Y929 Unspecified place or not applicable: Secondary | ICD-10-CM | POA: Insufficient documentation

## 2017-05-29 HISTORY — DX: Other chronic pain: G89.29

## 2017-05-29 HISTORY — DX: Pain in left shoulder: M25.512

## 2017-05-29 LAB — COMPREHENSIVE METABOLIC PANEL
ALBUMIN: 2.9 g/dL — AB (ref 3.5–5.0)
ALT: 33 U/L (ref 17–63)
AST: 27 U/L (ref 15–41)
Alkaline Phosphatase: 60 U/L (ref 38–126)
Anion gap: 7 (ref 5–15)
BUN: 9 mg/dL (ref 6–20)
CHLORIDE: 106 mmol/L (ref 101–111)
CO2: 26 mmol/L (ref 22–32)
CREATININE: 0.77 mg/dL (ref 0.61–1.24)
Calcium: 8.2 mg/dL — ABNORMAL LOW (ref 8.9–10.3)
GFR calc Af Amer: >60 mL/min (ref >60)
GLUCOSE: 86 mg/dL (ref 65–99)
POTASSIUM: 3.9 mmol/L (ref 3.5–5.1)
SODIUM: 139 mmol/L (ref 135–145)
Total Bilirubin: 0.4 mg/dL (ref 0.3–1.2)
Total Protein: 5.2 g/dL — ABNORMAL LOW (ref 6.5–8.1)

## 2017-05-29 LAB — CBC WITH DIFFERENTIAL/PLATELET
BASOS PCT: 1 %
Basophils Absolute: 0.1 10*3/uL (ref 0.0–0.1)
EOS PCT: 2 %
Eosinophils Absolute: 0.2 10*3/uL (ref 0.0–0.7)
HCT: 40.1 % (ref 39.0–52.0)
Hemoglobin: 13.6 g/dL (ref 13.0–17.0)
Lymphocytes Relative: 30 %
Lymphs Abs: 3.2 10*3/uL (ref 0.7–4.0)
MCH: 29.8 pg (ref 26.0–34.0)
MCHC: 33.9 g/dL (ref 30.0–36.0)
MCV: 87.7 fL (ref 78.0–100.0)
MONO ABS: 1.1 10*3/uL — AB (ref 0.1–1.0)
Monocytes Relative: 10 %
Neutro Abs: 6.2 10*3/uL (ref 1.7–7.7)
Neutrophils Relative %: 57 %
PLATELETS: 276 10*3/uL (ref 150–400)
RBC: 4.57 MIL/uL (ref 4.22–5.81)
RDW: 13.1 % (ref 11.5–15.5)
WBC: 10.8 10*3/uL — AB (ref 4.0–10.5)

## 2017-05-29 LAB — D-DIMER, QUANTITATIVE: D-Dimer, Quant: 0.32 ug/mL-FEU (ref 0.00–0.50)

## 2017-05-29 LAB — TROPONIN I

## 2017-05-29 MED ORDER — NAPROXEN 375 MG PO TABS: 375.0000 mg | ORAL_TABLET | Freq: Two times a day (BID) | ORAL | 0 refills | Status: AC

## 2017-05-29 MED ORDER — HYDROCODONE-ACETAMINOPHEN 5-325 MG PO TABS
2.0000 | ORAL_TABLET | Freq: Once | ORAL | Status: AC
  Administered 2017-05-29: 2 via ORAL
  Filled 2017-05-29: qty 2

## 2017-05-29 MED ORDER — METHYLPREDNISOLONE 4 MG PO TBPK: ORAL_TABLET | ORAL | 0 refills | Status: DC

## 2017-05-29 MED ORDER — TRAMADOL HCL 50 MG PO TABS: 50.0000 mg | ORAL_TABLET | Freq: Four times a day (QID) | ORAL | 0 refills | Status: DC | PRN

## 2017-05-29 NOTE — ED Notes (Signed)
Pt to XR in bed.

## 2017-05-29 NOTE — ED Notes (Signed)
Family at bedside. 

## 2017-05-29 NOTE — ED Triage Notes (Signed)
C/o right side CP-started yesterday-NAD-steady gait

## 2017-05-29 NOTE — ED Provider Notes (Signed)
[place] DEPT [facility] Provider Note   CSN: [id number] Arrival date & time: 05/29/17  2015  By signing my name below, I, Douglas Sloan, attest that this documentation has been prepared under the direction and in the presence of Duffy Bruce, MD. Electronically Signed: Reola Sloan, ED Scribe. 05/29/17. 10:25 PM.  History   Chief Complaint Chief Complaint  Patient presents with  . Chest Pain   The history is provided by the patient. No language interpreter was used.    HPI Comments: Douglas Sloan is a 36 y.o. male prior DVT, who presents to the Emergency Department complaining of sudden onset, persistent right upper neck and shoulder pain beginning last night. Pt reports that he was at work last night when he lifted a heavy object and felt a popping sensation to his right upper chest/shoulder and his pain has been present since. Pt notes that he does lift heavy object regularly for work. His pain is worse with movement of his neck to the left and deep inspirations. He notes some associated stiffness over the area and into the right upper arm and right-sided neck pain. He also reports that his pain will intermittently radiate posteirorly down his right upper arm and into the elbow. No noted treatments for his pain were tried prior to coming into the ED. He denies cough, shortness of breath, or any other associated symptoms.   Past Medical History:  Diagnosis Date  . Asthma    denies meds  . Bipolar disorder ([place])    denies taking any meds  . Chronic left shoulder pain   . DVT (deep venous thrombosis) ([place])    denies taking xarelto x1 month.  states they plan to put me on some after surgery.  . Schizophrenia [facility])    denies taking any meds   Patient Active Problem List   Diagnosis Date Noted  . Right shoulder pain 05/28/2017  . Right elbow pain 05/28/2017  . Left shoulder pain 04/28/2017  . S/P ACL reconstruction 08/03/2014  . Unspecified vitamin D deficiency  05/02/2014  . Left leg DVT ([place]) 04/03/2014  . Left fibular fracture 04/03/2014  . Unspecified asthma(493.90) 04/03/2014  . Smoking 04/03/2014  . Leg pain 04/03/2014   Past Surgical History:  Procedure Laterality Date  . ANTERIOR CRUCIATE LIGAMENT REPAIR Left 08/03/2014   Procedure: LEFT ALLOGRAFT ACL RECONSTRUCTION/;  Surgeon: Sydnee Cabal, MD;  Location: [facility];  Service: Orthopedics;  Laterality: Left;  . APPENDECTOMY    . FACIAL COSMETIC SURGERY     reconstructive  . KNEE ARTHROSCOPY Left 08/03/2014   Procedure: LEFT ARTHROSCOPY KNEE WITH DEBRIDEMENT;  Surgeon: Sydnee Cabal, MD;  Location: [facility];  Service: Orthopedics;  Laterality: Left;    Home Medications    Prior to Admission medications   Medication Sig Start Date End Date Taking? Authorizing Provider  methylPREDNISolone (MEDROL DOSEPAK) 4 MG TBPK tablet Take as directed on package 05/29/17   Duffy Bruce, MD  naproxen (NAPROSYN) 375 MG tablet Take 1 tablet (375 mg total) by mouth 2 (two) times daily with a meal. 05/29/17 06/05/17  Duffy Bruce, MD  traMADol (ULTRAM) 50 MG tablet Take 1-2 tablets (50-100 mg total) by mouth every 6 (six) hours as needed for moderate pain or severe pain. 05/29/17   Duffy Bruce, MD   Family History Family History  Problem Relation Age of Onset  . Diabetes Other   . Heart failure Other   . Diabetes Paternal Uncle  Social History Social History  Substance Use Topics  . Smoking status: Current Every Day Smoker    Packs/day: 0.50    Years: 10.00    Types: Cigarettes  . Smokeless tobacco: Never Used  . Alcohol use No   Allergies   Latex  Review of Systems Review of Systems  Constitutional: Negative for chills, fatigue and fever.  HENT: Negative for congestion and rhinorrhea.   Eyes: Negative for visual disturbance.  Respiratory: Negative for cough, shortness of breath and wheezing.   Cardiovascular: Negative for chest pain and leg  swelling.  Gastrointestinal: Negative for abdominal pain, diarrhea, nausea and vomiting.  Genitourinary: Negative for dysuria and flank pain.  Musculoskeletal: Positive for myalgias, neck pain and neck stiffness.  Skin: Negative for rash and wound.  Allergic/Immunologic: Negative for immunocompromised state.  Neurological: Negative for syncope, weakness and headaches.  All other systems reviewed and are negative.  Physical Exam Updated Vital Signs BP 118/71 (BP Location: Left Arm)   Pulse 63   Temp 98.2 F (36.8 C) (Oral)   Resp 16   Ht 5\' 11"  (1.803 m)   Wt 100.7 kg (222 lb)   SpO2 99%   BMI 30.96 kg/m   Physical Exam  Constitutional: He is oriented to person, place, and time. He appears well-developed and well-nourished. No distress.  HENT:  Head: Normocephalic and atraumatic.  Eyes: Conjunctivae are normal.  Neck: Normal range of motion. Neck supple.  TTP over right paracervical muscles, worse with rotation of head to left. Positive Spurling's test on right with reproduction of reported shoulder/arm pain shooting down upper arm. No midline TTP or deformity.  Cardiovascular: Normal rate, regular rhythm and normal heart sounds.  Exam reveals no friction rub.   No murmur heard. Pulmonary/Chest: Effort normal and breath sounds normal. No respiratory distress. He has no wheezes. He has no rales.  Abdominal: He exhibits no distension.  Musculoskeletal: Normal range of motion. He exhibits tenderness. He exhibits no edema.  Mild TTP over right superior trapezius extending to proximal shoulder. No deformity. No bruising. No chest wall TTP.  Neurological: He is alert and oriented to person, place, and time. He exhibits normal muscle tone.  Strength 5/5 throughout bilateral UE, proximally and distally. Intact sensation to light touch throughout b/l UE and hands.  Skin: Skin is warm. Capillary refill takes less than 2 seconds. No pallor.  Psychiatric: He has a normal mood and affect. His  behavior is normal.  Nursing note and vitals reviewed.  ED Treatments / Results  DIAGNOSTIC STUDIES: Oxygen Saturation is 100% on RA, normal by my interpretation.   COORDINATION OF CARE: 10:25 PM-Discussed next steps with pt. Pt verbalized understanding and is agreeable with the plan.   Labs (all labs ordered are listed, but only abnormal results are displayed) Labs Reviewed  CBC WITH DIFFERENTIAL/PLATELET - Abnormal; Notable for the following:       Result Value   WBC 10.8 (*)    Monocytes Absolute 1.1 (*)    All other components within normal limits  COMPREHENSIVE METABOLIC PANEL - Abnormal; Notable for the following:    Calcium 8.2 (*)    Total Protein 5.2 (*)    Albumin 2.9 (*)    All other components within normal limits  TROPONIN I  D-DIMER, QUANTITATIVE (NOT AT [facility])   EKG Normal sinus rhythm. Non-specific TWI diffusely that are not present on most recent EKG but have been demonstrated previously on prior EKGs dating back to 2017. No apparent acute changes.  Radiology Dg Chest 2 View  Result Date: 05/29/2017 CLINICAL DATA:  Acute onset right-sided chest pain. EXAM: CHEST  2 VIEW COMPARISON:  02/20/2017 chest radiograph FINDINGS: Stable heart size and mediastinal contours are within normal limits. Both lungs are clear. The visualized skeletal structures are unremarkable. IMPRESSION: No active cardiopulmonary disease. Electronically Signed   By: Kristine Garbe M.D.   On: 05/29/2017 21:50   Procedures Procedures   Medications Ordered in ED Medications  HYDROcodone-acetaminophen (NORCO/VICODIN) 5-325 MG per tablet 2 tablet (2 tablets Oral Given 05/29/17 2319)    Initial Impression / Assessment and Plan / ED Course  I have reviewed the triage vital signs and the nursing notes.  Pertinent labs & imaging results that were available during my care of the patient were reviewed by me and considered in my medical decision making (see chart for details).   36 yo M  here with right paraspinal neck pain shooting down his shoulder/upper arm after lifting at work. I suspect pt has acute cervical radiculopathy, likely from disc herniation in setting of lifting heavy objects. No signs of weakness, numbness, or loss of reflexes in bl UE. No trauma, no falls, no midline TTP and I do not suspect bony abnormality. He is o/w at his baseline state of health. He initially reported CP but I suspect this is referred from his neck. He has no acute changes on EKG and neg trop despite sx >12 hours constantly - doubt ACS. D-Dimer neg, doubt PE. Labs are o/w reassuring. Will tx with anti-inflammatories, steroids, outpt follow-up.   Final Clinical Impressions(s) / ED Diagnoses   Final diagnoses:  Cervical radiculopathy   New Prescriptions Discharge Medication List as of 05/29/2017 11:36 PM    START taking these medications   Details  methylPREDNISolone (MEDROL DOSEPAK) 4 MG TBPK tablet Take as directed on package, Print    naproxen (NAPROSYN) 375 MG tablet Take 1 tablet (375 mg total) by mouth 2 (two) times daily with a meal., Starting Fri 05/29/2017, Until Fri 06/05/2017, Print    traMADol (ULTRAM) 50 MG tablet Take 1-2 tablets (50-100 mg total) by mouth every 6 (six) hours as needed for moderate pain or severe pain., Starting Fri 05/29/2017, Print        I personally performed the services described in this documentation, which was scribed in my presence. The recorded information has been reviewed and is accurate.     Duffy Bruce, MD 05/30/17 1101

## 2017-05-29 NOTE — ED Notes (Signed)
ED Provider at bedside. 

## 2017-06-07 ENCOUNTER — Emergency Department (HOSPITAL_BASED_OUTPATIENT_CLINIC_OR_DEPARTMENT_OTHER): Payer: Self-pay

## 2017-06-07 ENCOUNTER — Other Ambulatory Visit: Payer: Self-pay

## 2017-06-07 ENCOUNTER — Encounter (HOSPITAL_BASED_OUTPATIENT_CLINIC_OR_DEPARTMENT_OTHER): Payer: Self-pay | Admitting: *Deleted

## 2017-06-07 ENCOUNTER — Emergency Department (HOSPITAL_BASED_OUTPATIENT_CLINIC_OR_DEPARTMENT_OTHER)
Admission: EM | Admit: 2017-06-07 | Discharge: 2017-06-08 | Disposition: A | Discharge status: Home / Self Care | Payer: Self-pay | Attending: Emergency Medicine | Admitting: Emergency Medicine

## 2017-06-07 DIAGNOSIS — M79605 Pain in left leg: Secondary | ICD-10-CM | POA: Insufficient documentation

## 2017-06-07 DIAGNOSIS — J45909 Unspecified asthma, uncomplicated: Secondary | ICD-10-CM | POA: Insufficient documentation

## 2017-06-07 DIAGNOSIS — Z91040 Latex allergy status: Secondary | ICD-10-CM | POA: Insufficient documentation

## 2017-06-07 DIAGNOSIS — F17210 Nicotine dependence, cigarettes, uncomplicated: Secondary | ICD-10-CM | POA: Insufficient documentation

## 2017-06-07 DIAGNOSIS — R072 Precordial pain: Secondary | ICD-10-CM | POA: Insufficient documentation

## 2017-06-07 LAB — BASIC METABOLIC PANEL
Anion gap: 8 (ref 5–15)
BUN: 13 mg/dL (ref 6–20)
CALCIUM: 8.3 mg/dL — AB (ref 8.9–10.3)
CHLORIDE: 104 mmol/L (ref 101–111)
CO2: 27 mmol/L (ref 22–32)
CREATININE: 0.86 mg/dL (ref 0.61–1.24)
GFR calc non Af Amer: >60 mL/min (ref >60)
Glucose, Bld: 112 mg/dL — ABNORMAL HIGH (ref 65–99)
Potassium: 3.6 mmol/L (ref 3.5–5.1)
SODIUM: 139 mmol/L (ref 135–145)

## 2017-06-07 LAB — CBC WITH DIFFERENTIAL/PLATELET
BASOS ABS: 0 10*3/uL (ref 0.0–0.1)
BASOS PCT: 0 %
EOS ABS: 0.2 10*3/uL (ref 0.0–0.7)
Eosinophils Relative: 2 %
HCT: 42 % (ref 39.0–52.0)
HEMOGLOBIN: 14.4 g/dL (ref 13.0–17.0)
Lymphocytes Relative: 30 %
Lymphs Abs: 3 10*3/uL (ref 0.7–4.0)
MCH: 29.6 pg (ref 26.0–34.0)
MCHC: 34.3 g/dL (ref 30.0–36.0)
MCV: 86.4 fL (ref 78.0–100.0)
MONOS PCT: 10 %
Monocytes Absolute: 1 10*3/uL (ref 0.1–1.0)
NEUTROS PCT: 58 %
Neutro Abs: 5.8 10*3/uL (ref 1.7–7.7)
Platelets: 297 10*3/uL (ref 150–400)
RBC: 4.86 MIL/uL (ref 4.22–5.81)
RDW: 13.3 % (ref 11.5–15.5)
WBC: 9.9 10*3/uL (ref 4.0–10.5)

## 2017-06-07 LAB — TROPONIN I

## 2017-06-07 MED ORDER — HYDROCODONE-ACETAMINOPHEN 5-325 MG PO TABS
1.0000 | ORAL_TABLET | Freq: Once | ORAL | Status: AC
  Administered 2017-06-07: 1 via ORAL
  Filled 2017-06-07: qty 1

## 2017-06-07 MED ORDER — IOPAMIDOL (ISOVUE-370) INJECTION 76%
100.0000 mL | Freq: Once | INTRAVENOUS | Status: AC | PRN
  Administered 2017-06-08: 100 mL via INTRAVENOUS
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
## 2017-06-07 NOTE — ED Triage Notes (Addendum)
H/o DVT, stopped xarelto on his own 1 year ago, smoker, developed L leg pain (posterior thigh and groin) 1 week ago, gradually progressively worse, developed CP and sob today, also reports nausea sweating and vomited x1 this am. Describes pain as a cramping. No PCP. Rates pain 9/10. EKG initiated in triage with CP complaint. (Denies recent surgery, long distance travel or extended sitting; denies marijuana or other drug use).  Alert, NAD, calm, interactive, resps e/u, speaking in clear complete sentences, no dyspnea noted, skin warm and sweaty.

## 2017-06-07 NOTE — ED Notes (Signed)
EDP into room, at BS.  ?

## 2017-06-07 NOTE — ED Notes (Signed)
Patient transported to X-ray 

## 2017-06-08 ENCOUNTER — Ambulatory Visit (HOSPITAL_BASED_OUTPATIENT_CLINIC_OR_DEPARTMENT_OTHER)
Admission: RE | Admit: 2017-06-08 | Discharge: 2017-06-08 | Disposition: A | Discharge status: Home / Self Care | Payer: Self-pay | Source: Ambulatory Visit | Attending: Emergency Medicine | Admitting: Emergency Medicine

## 2017-06-08 DIAGNOSIS — Z86718 Personal history of other venous thrombosis and embolism: Secondary | ICD-10-CM | POA: Insufficient documentation

## 2017-06-08 DIAGNOSIS — M79652 Pain in left thigh: Secondary | ICD-10-CM | POA: Insufficient documentation

## 2017-06-08 MED ORDER — ENOXAPARIN SODIUM 100 MG/ML ~~LOC~~ SOLN
1.0000 mg/kg | Freq: Once | SUBCUTANEOUS | Status: AC
  Administered 2017-06-08: 100 mg via SUBCUTANEOUS
  Filled 2017-06-08: qty 1

## 2017-06-08 MED ORDER — OXYCODONE-ACETAMINOPHEN 5-325 MG PO TABS
1.0000 | ORAL_TABLET | Freq: Once | ORAL | Status: AC
  Administered 2017-06-08: 1 via ORAL
  Filled 2017-06-08: qty 1

## 2017-06-08 NOTE — ED Notes (Signed)
EDP finished at [facility]. Alert, NAD, calm, interactive, resps e/u, speaking in clear complete sentences, no dyspnea noted, skin W&D, VSS. Family at [facility].

## 2017-06-08 NOTE — Discharge Instructions (Signed)
Were seen today for concerns for chest pain and history of blood clots. Your workup is reassuring including a chest CT. You were given one dose of Lovenox and should return later this morning for ultrasound.

## 2017-06-08 NOTE — ED Notes (Signed)
Imaging in to schedule [place] appointment tomorrow morning.

## 2017-06-08 NOTE — ED Notes (Signed)
Pt returned from CT °

## 2017-07-12 ENCOUNTER — Emergency Department (HOSPITAL_COMMUNITY)
Admission: EM | Admit: 2017-07-12 | Discharge: 2017-07-14 | Disposition: A | Discharge status: Home / Self Care | Payer: Self-pay | Attending: Emergency Medicine | Admitting: Emergency Medicine

## 2017-07-12 ENCOUNTER — Encounter (HOSPITAL_COMMUNITY): Payer: Self-pay | Admitting: *Deleted

## 2017-07-12 DIAGNOSIS — Z91040 Latex allergy status: Secondary | ICD-10-CM | POA: Insufficient documentation

## 2017-07-12 DIAGNOSIS — F1290 Cannabis use, unspecified, uncomplicated: Secondary | ICD-10-CM

## 2017-07-12 DIAGNOSIS — F1220 Cannabis dependence, uncomplicated: Secondary | ICD-10-CM

## 2017-07-12 DIAGNOSIS — T50902A Poisoning by unspecified drugs, medicaments and biological substances, intentional self-harm, initial encounter: Secondary | ICD-10-CM | POA: Insufficient documentation

## 2017-07-12 DIAGNOSIS — F1994 Other psychoactive substance use, unspecified with psychoactive substance-induced mood disorder: Secondary | ICD-10-CM

## 2017-07-12 DIAGNOSIS — J45909 Unspecified asthma, uncomplicated: Secondary | ICD-10-CM | POA: Insufficient documentation

## 2017-07-12 DIAGNOSIS — F17210 Nicotine dependence, cigarettes, uncomplicated: Secondary | ICD-10-CM | POA: Insufficient documentation

## 2017-07-12 DIAGNOSIS — F1210 Cannabis abuse, uncomplicated: Secondary | ICD-10-CM | POA: Diagnosis present

## 2017-07-12 DIAGNOSIS — R45851 Suicidal ideations: Secondary | ICD-10-CM

## 2017-07-12 LAB — ACETAMINOPHEN LEVEL

## 2017-07-12 LAB — COMPREHENSIVE METABOLIC PANEL
ALK PHOS: 68 U/L (ref 38–126)
ALT: 39 U/L (ref 17–63)
AST: 32 U/L (ref 15–41)
Albumin: 3.6 g/dL (ref 3.5–5.0)
Anion gap: 9 (ref 5–15)
BILIRUBIN TOTAL: 0.5 mg/dL (ref 0.3–1.2)
BUN: 7 mg/dL (ref 6–20)
CALCIUM: 9.1 mg/dL (ref 8.9–10.3)
CHLORIDE: 107 mmol/L (ref 101–111)
CO2: 26 mmol/L (ref 22–32)
CREATININE: 0.91 mg/dL (ref 0.61–1.24)
Glucose, Bld: 102 mg/dL — ABNORMAL HIGH (ref 65–99)
Potassium: 3.8 mmol/L (ref 3.5–5.1)
Sodium: 142 mmol/L (ref 135–145)
TOTAL PROTEIN: 6.8 g/dL (ref 6.5–8.1)

## 2017-07-12 LAB — CBC WITH DIFFERENTIAL/PLATELET
BASOS ABS: 0.1 10*3/uL (ref 0.0–0.1)
BASOS PCT: 0 %
Eosinophils Absolute: 0.1 10*3/uL (ref 0.0–0.7)
Eosinophils Relative: 0 %
HEMATOCRIT: 45.2 % (ref 39.0–52.0)
HEMOGLOBIN: 15 g/dL (ref 13.0–17.0)
Lymphocytes Relative: 19 %
Lymphs Abs: 2.5 10*3/uL (ref 0.7–4.0)
MCH: 29.5 pg (ref 26.0–34.0)
MCHC: 33.2 g/dL (ref 30.0–36.0)
MCV: 88.8 fL (ref 78.0–100.0)
Monocytes Absolute: 0.6 10*3/uL (ref 0.1–1.0)
Monocytes Relative: 4 %
NEUTROS ABS: 10.3 10*3/uL — AB (ref 1.7–7.7)
NEUTROS PCT: 77 %
Platelets: 315 10*3/uL (ref 150–400)
RBC: 5.09 MIL/uL (ref 4.22–5.81)
RDW: 14.2 % (ref 11.5–15.5)
WBC: 13.5 10*3/uL — ABNORMAL HIGH (ref 4.0–10.5)

## 2017-07-12 LAB — RAPID URINE DRUG SCREEN, HOSP PERFORMED
Amphetamines: NOT DETECTED
BARBITURATES: NOT DETECTED
Benzodiazepines: NOT DETECTED
Cocaine: NOT DETECTED
OPIATES: NOT DETECTED
TETRAHYDROCANNABINOL: POSITIVE — AB

## 2017-07-12 LAB — ETHANOL: Alcohol, Ethyl (B): 42 mg/dL — ABNORMAL HIGH (ref <5)

## 2017-07-12 LAB — SALICYLATE LEVEL: Salicylate Lvl: <7 mg/dL (ref 2.8–30.0)

## 2017-07-12 MED ORDER — LORAZEPAM 2 MG/ML IJ SOLN: 0.0000 mg | Freq: Two times a day (BID) | INTRAMUSCULAR | Status: DC

## 2017-07-12 MED ORDER — VITAMIN B-1 100 MG PO TABS
100.0000 mg | ORAL_TABLET | Freq: Every day | ORAL | Status: DC
  Administered 2017-07-12 – 2017-07-14 (×3): 100 mg via ORAL
  Filled 2017-07-12 (×4): qty 1

## 2017-07-12 MED ORDER — ALUM & MAG HYDROXIDE-SIMETH 200-200-20 MG/5ML PO SUSP: 30.0000 mL | Freq: Four times a day (QID) | ORAL | Status: DC | PRN

## 2017-07-12 MED ORDER — NICOTINE 21 MG/24HR TD PT24
21.0000 mg | MEDICATED_PATCH | Freq: Once | TRANSDERMAL | Status: AC
  Administered 2017-07-12: 21 mg via TRANSDERMAL

## 2017-07-12 MED ORDER — ACETAMINOPHEN 325 MG PO TABS
650.0000 mg | ORAL_TABLET | ORAL | Status: DC | PRN
  Administered 2017-07-14: 650 mg via ORAL
  Filled 2017-07-12: qty 2

## 2017-07-12 MED ORDER — LORAZEPAM 2 MG/ML IJ SOLN
0.0000 mg | Freq: Four times a day (QID) | INTRAMUSCULAR | Status: DC
  Administered 2017-07-12: 15:00:00 via INTRAVENOUS

## 2017-07-12 MED ORDER — NICOTINE 21 MG/24HR TD PT24
21.0000 mg | MEDICATED_PATCH | Freq: Every day | TRANSDERMAL | Status: DC
  Administered 2017-07-13 – 2017-07-14 (×2): 21 mg via TRANSDERMAL
  Filled 2017-07-12 (×2): qty 1

## 2017-07-12 MED ORDER — LORAZEPAM 2 MG/ML IJ SOLN
INTRAMUSCULAR | Status: AC
  Filled 2017-07-12: qty 1

## 2017-07-12 MED ORDER — NICOTINE 21 MG/24HR TD PT24
MEDICATED_PATCH | TRANSDERMAL | Status: AC
  Filled 2017-07-12: qty 1

## 2017-07-12 MED ORDER — ONDANSETRON HCL 4 MG PO TABS: 4.0000 mg | ORAL_TABLET | Freq: Three times a day (TID) | ORAL | Status: DC | PRN

## 2017-07-12 MED ORDER — LORAZEPAM 1 MG PO TABS: 0.0000 mg | ORAL_TABLET | Freq: Two times a day (BID) | ORAL | Status: DC

## 2017-07-12 MED ORDER — ZOLPIDEM TARTRATE 5 MG PO TABS
5.0000 mg | ORAL_TABLET | Freq: Every evening | ORAL | Status: DC | PRN
  Administered 2017-07-13: 5 mg via ORAL
  Filled 2017-07-12: qty 1

## 2017-07-12 MED ORDER — THIAMINE HCL 100 MG/ML IJ SOLN: 100.0000 mg | Freq: Every day | INTRAMUSCULAR | Status: DC

## 2017-07-12 MED ORDER — LORAZEPAM 1 MG PO TABS: 0.0000 mg | ORAL_TABLET | Freq: Four times a day (QID) | ORAL | Status: DC

## 2017-07-12 NOTE — ED Notes (Signed)
Patients belongings are in the cabinets (16-18) behind the nurses station. There are 2 bags marked with a pt label.

## 2017-07-12 NOTE — ED Notes (Signed)
Call poison control nurse and recommended lab work to be done and EKG. M.D made aware of poison control recommendation.

## 2017-07-12 NOTE — ED Notes (Signed)
Pt refusing EKG, blood work and urine specimen. Will notify RN and MD.

## 2017-07-12 NOTE — BH Assessment (Signed)
[place] Assessment Progress Note     TTS attempted to assess patient, but he had been sedated with Ativan and is sound asleep and unable to be aroused at this time.  Will try to re-assess once he is less sedated and more alert.

## 2017-07-12 NOTE — ED Notes (Signed)
Pt compliant with writer, able to obtain labs and EKG completed, pt changed into scrubs. At present resting in room.
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
## 2017-07-12 NOTE — BH Assessment (Signed)
[place] Assessment Progress Note    Attempted to assess patient again.  He awoke briefly answered one question and fell asleep again.  Will try to assess again when he is transferred at [facility].

## 2017-07-12 NOTE — BH Assessment (Signed)
[place] Assessment Progress Note   TTS completed assessment. Pt is recommended for inpt treatment per Lindon Romp, NP. EDP Duffy Bruce, MD notified of disposition. TTS to seek placement. No appropriate beds at [facility] at this time per [facility], RN.   Lind Covert, MSW, [place] TTS Specialist [phone]

## 2017-07-12 NOTE — ED Triage Notes (Signed)
EMS reports pt took Seroquel 12  300mg  tabs last night with intent to kill self, continues to want to harm self. Family member took pt to [facility] whom had a confrontation with staff. At that time they called EMS and sent him here. Pt still comments her wants to kill self. On questionnaire from [facility] "Death" is the reason he went to the facility.

## 2017-07-12 NOTE — ED Provider Notes (Signed)
[facility] DEPT Provider Note   CSN: [id number] Arrival date & time: 07/12/17  1428     History   Chief Complaint Chief Complaint  Patient presents with  . Medical Clearance    HPI Douglas MCCLINTOCK is a 36 y.o. male.  Pt presents to the ED today after an overdose last night on 12, 300 mg seroquel tablets.  The pt said he wanted to kill himself.  His family member took him to [facility] for evaluation.  Pt sent here for medical clearance.  He still wants to kill himself.  Pt is not happy about being here.  He said life is not worth living.      Past Medical History:  Diagnosis Date  . Asthma    denies meds  . Bipolar disorder ([place])    denies taking any meds  . Chronic left shoulder pain   . DVT (deep venous thrombosis) ([place])    denies taking xarelto x1 month.  states they plan to put me on some after surgery.  . Schizophrenia [facility])    denies taking any meds    Patient Active Problem List   Diagnosis Date Noted  . Right shoulder pain 05/28/2017  . Right elbow pain 05/28/2017  . Left shoulder pain 04/28/2017  . S/P ACL reconstruction 08/03/2014  . Unspecified vitamin D deficiency 05/02/2014  . Left leg DVT ([place]) 04/03/2014  . Left fibular fracture 04/03/2014  . Unspecified asthma(493.90) 04/03/2014  . Smoking 04/03/2014  . Leg pain 04/03/2014    Past Surgical History:  Procedure Laterality Date  . ANTERIOR CRUCIATE LIGAMENT REPAIR Left 08/03/2014   Procedure: LEFT ALLOGRAFT ACL RECONSTRUCTION/;  Surgeon: Sydnee Cabal, MD;  Location: [facility];  Service: Orthopedics;  Laterality: Left;  . APPENDECTOMY    . FACIAL COSMETIC SURGERY     reconstructive  . KNEE ARTHROSCOPY Left 08/03/2014   Procedure: LEFT ARTHROSCOPY KNEE WITH DEBRIDEMENT;  Surgeon: Sydnee Cabal, MD;  Location: [facility];  Service: Orthopedics;  Laterality: Left;       Home Medications    Prior to Admission medications   Medication Sig  Start Date End Date Taking? Authorizing Provider  methylPREDNISolone (MEDROL DOSEPAK) 4 MG TBPK tablet Take as directed on package 05/29/17   Duffy Bruce, MD  traMADol (ULTRAM) 50 MG tablet Take 1-2 tablets (50-100 mg total) by mouth every 6 (six) hours as needed for moderate pain or severe pain. 05/29/17   Duffy Bruce, MD    Family History Family History  Problem Relation Age of Onset  . Diabetes Other   . Heart failure Other   . Diabetes Paternal Uncle     Social History Social History  Substance Use Topics  . Smoking status: Current Every Day Smoker    Packs/day: 0.50    Years: 10.00    Types: Cigarettes  . Smokeless tobacco: Never Used  . Alcohol use Yes     Allergies   Latex   Review of Systems Review of Systems  Psychiatric/Behavioral: Positive for self-injury and suicidal ideas.  All other systems reviewed and are negative.    Physical Exam Updated Vital Signs BP (!) 149/87 (BP Location: Left Arm)   Pulse 78   Temp 98.9 F (37.2 C) (Oral)   Resp 20   Ht 5\' 11"  (1.803 m)   Wt 99.8 kg (220 lb)   SpO2 98%   BMI 30.68 kg/m   Physical Exam  Constitutional: He is oriented to person, place, and  time. He appears well-developed and well-nourished.  HENT:  Head: Normocephalic and atraumatic.  Right Ear: External ear normal.  Left Ear: External ear normal.  Nose: Nose normal.  Mouth/Throat: Oropharynx is clear and moist.  Eyes: Pupils are equal, round, and reactive to light. Conjunctivae and EOM are normal.  Neck: Normal range of motion. Neck supple.  Cardiovascular: Normal rate, regular rhythm, normal heart sounds and intact distal pulses.   Pulmonary/Chest: Effort normal and breath sounds normal.  Abdominal: Soft. Bowel sounds are normal.  Musculoskeletal: Normal range of motion.  Neurological: He is alert and oriented to person, place, and time.  Skin: Skin is warm.  Psychiatric: His affect is angry. He is agitated. He expresses impulsivity. He  expresses suicidal ideation. He expresses suicidal plans.  Nursing note and vitals reviewed.    ED Treatments / Results  Labs (all labs ordered are listed, but only abnormal results are displayed) Labs Reviewed  COMPREHENSIVE METABOLIC PANEL - Abnormal; Notable for the following:       Result Value   Glucose, Bld 102 (*)    All other components within normal limits  ETHANOL - Abnormal; Notable for the following:    Alcohol, Ethyl (B) 42 (*)    All other components within normal limits  RAPID URINE DRUG SCREEN, HOSP PERFORMED - Abnormal; Notable for the following:    Tetrahydrocannabinol POSITIVE (*)    All other components within normal limits  CBC WITH DIFFERENTIAL/PLATELET - Abnormal; Notable for the following:    WBC 13.5 (*)    Neutro Abs 10.3 (*)    All other components within normal limits  ACETAMINOPHEN LEVEL - Abnormal; Notable for the following:    Acetaminophen (Tylenol), Serum <10 (*)    All other components within normal limits  SALICYLATE LEVEL    EKG  EKG Interpretation  Date/Time:  Sunday July 12 2017 15:36:20 EDT Ventricular Rate:  91 PR Interval:    QRS Duration: 80 QT Interval:  350 QTC Calculation: 431 R Axis:   89 Text Interpretation:  Sinus rhythm Confirmed by Isla Pence [phone]) on 07/12/2017 3:41:47 PM       Radiology No results found.  Procedures Procedures (including critical care time)  Medications Ordered in ED Medications  nicotine (NICODERM CQ - dosed in mg/24 hours) patch 21 mg (21 mg Transdermal Patch Applied 07/12/17 1521)  LORazepam (ATIVAN) injection 0-4 mg (not administered)    Or  LORazepam (ATIVAN) tablet 0-4 mg (not administered)  LORazepam (ATIVAN) injection 0-4 mg (not administered)    Or  LORazepam (ATIVAN) tablet 0-4 mg (not administered)  thiamine (VITAMIN B-1) tablet 100 mg (not administered)    Or  thiamine (B-1) injection 100 mg (not administered)  acetaminophen (TYLENOL) tablet 650 mg (not  administered)  zolpidem (AMBIEN) tablet 5 mg (not administered)  ondansetron (ZOFRAN) tablet 4 mg (not administered)  alum & mag hydroxide-simeth (MAALOX/MYLANTA) 200-200-20 MG/5ML suspension 30 mL (not administered)  nicotine (NICODERM CQ - dosed in mg/24 hours) patch 21 mg (not administered)  LORazepam (ATIVAN) 2 MG/ML injection (  Given 07/12/17 1503)     Initial Impression / Assessment and Plan / ED Course  I have reviewed the triage vital signs and the nursing notes.  Pertinent labs & imaging results that were available during my care of the patient were reviewed by me and considered in my medical decision making (see chart for details).     IVC papers were completed b/c he attempted to leave.  He did let the  nurse give him 2 mg of ativan which helped him calm down.  Pt is medically clear.  TTS consult ordered.  Pt will need inpatient admission.  Final Clinical Impressions(s) / ED Diagnoses   Final diagnoses:  Suicidal ideation  Intentional drug overdose, initial encounter ([place])  Marijuana use    New Prescriptions New Prescriptions   No medications on file     Isla Pence, MD 07/12/17 [phone]

## 2017-07-12 NOTE — ED Provider Notes (Signed)
For clarification purposes, per Dr. Elyse Hsu original note and discussion with Poison Control, patient is medically clear for psychiatric evaluation, treatment, and disposition. Lab work reassuring.   Duffy Bruce, MD 07/12/17 [phone]

## 2017-07-13 DIAGNOSIS — T43592A Poisoning by other antipsychotics and neuroleptics, intentional self-harm, initial encounter: Secondary | ICD-10-CM

## 2017-07-13 DIAGNOSIS — R450 Nervousness: Secondary | ICD-10-CM

## 2017-07-13 DIAGNOSIS — F419 Anxiety disorder, unspecified: Secondary | ICD-10-CM

## 2017-07-13 DIAGNOSIS — F1910 Other psychoactive substance abuse, uncomplicated: Secondary | ICD-10-CM

## 2017-07-13 DIAGNOSIS — T1491XA Suicide attempt, initial encounter: Secondary | ICD-10-CM

## 2017-07-13 DIAGNOSIS — R4587 Impulsiveness: Secondary | ICD-10-CM

## 2017-07-13 DIAGNOSIS — F17210 Nicotine dependence, cigarettes, uncomplicated: Secondary | ICD-10-CM

## 2017-07-13 DIAGNOSIS — F1210 Cannabis abuse, uncomplicated: Secondary | ICD-10-CM

## 2017-07-13 MED ORDER — ASENAPINE MALEATE 5 MG SL SUBL
10.0000 mg | SUBLINGUAL_TABLET | Freq: Two times a day (BID) | SUBLINGUAL | Status: DC | PRN
  Administered 2017-07-13: 10 mg via SUBLINGUAL
  Filled 2017-07-13: qty 2

## 2017-07-13 NOTE — ED Notes (Signed)
Visitor at bedside.

## 2017-07-13 NOTE — Progress Notes (Signed)
07/13/17 1342:  LRT went to pt room to offer activities, pt was sleep.   Victorino Sparrow, LRT/CTRS

## 2017-07-13 NOTE — ED Notes (Signed)
Patient in day room during this assessment. Mood and affect sad and blunted. He denied SI/Hi and denied Hallucinations. Writer encouraged and supported patient. Offered patient snack and beverages. Q 15 minute checks continues for safety as ordered.
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
## 2017-07-13 NOTE — ED Notes (Signed)
Pt becoming increasingly agitated, threatening to "flip out and throw shit." This nurse notified Margarita Grizzle NP of pt behavior.

## 2017-07-13 NOTE — ED Notes (Signed)
CIWA assessment deferred, pt is currently sleeping.  

## 2017-07-14 DIAGNOSIS — F1220 Cannabis dependence, uncomplicated: Secondary | ICD-10-CM

## 2017-07-14 DIAGNOSIS — F1994 Other psychoactive substance use, unspecified with psychoactive substance-induced mood disorder: Secondary | ICD-10-CM

## 2017-07-14 MED ORDER — HALOPERIDOL 2 MG PO TABS
2.0000 mg | ORAL_TABLET | Freq: Two times a day (BID) | ORAL | Status: DC
  Administered 2017-07-14: 2 mg via ORAL
  Filled 2017-07-14: qty 1

## 2017-07-14 MED ORDER — GABAPENTIN 300 MG PO CAPS
300.0000 mg | ORAL_CAPSULE | Freq: Two times a day (BID) | ORAL | Status: DC
  Administered 2017-07-14: 300 mg via ORAL
  Filled 2017-07-14: qty 1

## 2017-07-14 MED ORDER — HALOPERIDOL 2 MG PO TABS: 2.0000 mg | ORAL_TABLET | Freq: Two times a day (BID) | ORAL | 0 refills | Status: DC

## 2017-07-14 MED ORDER — GABAPENTIN 300 MG PO CAPS: 300.0000 mg | ORAL_CAPSULE | Freq: Two times a day (BID) | ORAL | 0 refills | Status: DC

## 2017-07-14 NOTE — ED Notes (Signed)
Pt c/o tooth pain on his left side. This nurse notified EDP. Will continue to monitor.

## 2017-07-14 NOTE — ED Notes (Signed)
Pt taking a shower 

## 2017-07-14 NOTE — ED Notes (Signed)
Pt d/c home per MD order. Discharge summary reviewed with pt. Pt verbalizes understanding. RX provided. Pt denies SI/HI/AVH. Pt signed for personal property and property returned. Pt signed e-signature. Ambulatory off unit with [facility].

## 2017-07-14 NOTE — Consult Note (Signed)
[facility] Consult   Reason for Consult:  Suicidal ideation with self reported overdose Referring Physician:  EDP Patient Identification: Douglas Sloan MRN:  [id number] Principal Diagnosis: Substance induced mood disorder ([place]) Diagnosis:   Patient Active Problem List   Diagnosis Date Noted  . Cannabis use disorder, severe, dependence ([place]) [F12.20] 07/14/2017  . Substance induced mood disorder ([place]) [F19.94] 07/14/2017  . Marijuana abuse, continuous [F12.10] 07/13/2017  . Right shoulder pain [M25.511] 05/28/2017  . Right elbow pain [M25.521] 05/28/2017  . Left shoulder pain [M25.512] 04/28/2017  . S/P ACL reconstruction [P50.932] 08/03/2014  . Unspecified vitamin D deficiency [E55.9] 05/02/2014  . Left leg DVT ([place]) [I82.402] 04/03/2014  . Left fibular fracture [S82.402A] 04/03/2014  . Unspecified asthma(493.90) [I71.245] 04/03/2014  . Smoking [F17.200] 04/03/2014  . Leg pain [M79.606] 04/03/2014    Total Time spent with patient: 45 minutes  Subjective:   Douglas Sloan is a 36 y.o. male patient admitted with self reported overdose on 12 300 mg Seroquel tablets.  HPI:  Pt was seen and chart reviewed with treatment team and Dr Darleene Cleaver. Pt stated he intentionally took pills in an attempt to end his life because he feels his life is not worth living. Pt reported he smokes 14-28 grams of marijuana daily and is upset because the mother of his daughter refuses to let him see the child. Pt's UDS positive for THC and BAL 42 on admission. Today,  Pt denies suicidal/homicidal ideation, denies auditory/visual hallucinations and does not appear to be responding to internal stimuli. Pt denies paranoia, psychosis and mood swings. Pt was started on gabapentin for anxiety and haldol for mood stabilization and provided with prescriptions. pt is psychiatrically clear for discharge home and follow up at [facility] for medication management and therapy.   Past Psychiatric History: As  above   Risk to Self: None Risk to Others:  None Prior Inpatient Therapy: Prior Inpatient Therapy: No Prior Outpatient Therapy: Prior Outpatient Therapy: Yes Prior Therapy Dates: current Prior Therapy Facilty/Provider(s): [facility] Reason for Treatment: med management, group therapy  Does patient have an ACCT team?: No Does patient have Intensive In-House Services?  : No Does patient have [facility] services? : Yes Does patient have [facility] services?: No  Past Medical History:  Past Medical History:  Diagnosis Date  . Asthma    denies meds  . Bipolar disorder ([place])    denies taking any meds  . Chronic left shoulder pain   . DVT (deep venous thrombosis) ([place])    denies taking xarelto x1 month.  states they plan to put me on some after surgery.  . Schizophrenia ([place])    denies taking any meds    Past Surgical History:  Procedure Laterality Date  . ANTERIOR CRUCIATE LIGAMENT REPAIR Left 08/03/2014   Procedure: LEFT ALLOGRAFT ACL RECONSTRUCTION/;  Surgeon: Sydnee Cabal, MD;  Location: [facility];  Service: Orthopedics;  Laterality: Left;  . APPENDECTOMY    . FACIAL COSMETIC SURGERY     reconstructive  . KNEE ARTHROSCOPY Left 08/03/2014   Procedure: LEFT ARTHROSCOPY KNEE WITH DEBRIDEMENT;  Surgeon: Sydnee Cabal, MD;  Location: [facility];  Service: Orthopedics;  Laterality: Left;   Family History:  Family History  Problem Relation Age of Onset  . Diabetes Other   . Heart failure Other   . Diabetes Paternal Uncle    Family Psychiatric  History: Unknown Social History:  History  Alcohol Use  . Yes     History  Drug Use No    Social History   Social History  . Marital status: Single    Spouse name: N/A  . Number of children: N/A  . Years of education: N/A   Social History Main Topics  . Smoking status: Current Every Day Smoker    Packs/day: 0.50    Years: 10.00    Types: Cigarettes  . Smokeless tobacco: Never Used  . Alcohol use  Yes  . Drug use: No  . Sexual activity: Not Asked   Other Topics Concern  . None   Social History Narrative  . None   Additional Social History:    Allergies:   Allergies  Allergen Reactions  . Latex Hives    Labs:  Results for orders placed or performed during the hospital encounter of 07/12/17 (from the past 48 hour(s))  Comprehensive metabolic panel     Status: Abnormal   Collection Time: 07/12/17  3:25 PM  Result Value Ref Range   Sodium 142 135 - 145 mmol/L   Potassium 3.8 3.5 - 5.1 mmol/L   Chloride 107 101 - 111 mmol/L   CO2 26 22 - 32 mmol/L   Glucose, Bld 102 (H) 65 - 99 mg/dL   BUN 7 6 - 20 mg/dL   Creatinine, Ser 0.91 0.61 - 1.24 mg/dL   Calcium 9.1 8.9 - 10.3 mg/dL   Total Protein 6.8 6.5 - 8.1 g/dL   Albumin 3.6 3.5 - 5.0 g/dL   AST 32 15 - 41 U/L   ALT 39 17 - 63 U/L   Alkaline Phosphatase 68 38 - 126 U/L   Total Bilirubin 0.5 0.3 - 1.2 mg/dL   GFR calc non Af Amer >60 >60 mL/min   GFR calc Af Amer >60 >60 mL/min    Comment: (NOTE) The eGFR has been calculated using the CKD EPI equation. This calculation has not been validated in all clinical situations. eGFR's persistently <60 mL/min signify possible Chronic Kidney Disease.    Anion gap 9 5 - 15  Ethanol     Status: Abnormal   Collection Time: 07/12/17  3:25 PM  Result Value Ref Range   Alcohol, Ethyl (B) 42 (H) <5 mg/dL    Comment:        LOWEST DETECTABLE LIMIT FOR SERUM ALCOHOL IS 5 mg/dL FOR MEDICAL PURPOSES ONLY   CBC with Diff     Status: Abnormal   Collection Time: 07/12/17  3:25 PM  Result Value Ref Range   WBC 13.5 (H) 4.0 - 10.5 K/uL   RBC 5.09 4.22 - 5.81 MIL/uL   Hemoglobin 15.0 13.0 - 17.0 g/dL   HCT 45.2 39.0 - 52.0 %   MCV 88.8 78.0 - 100.0 fL   MCH 29.5 26.0 - 34.0 pg   MCHC 33.2 30.0 - 36.0 g/dL   RDW 14.2 11.5 - 15.5 %   Platelets 315 150 - 400 K/uL   Neutrophils Relative % 77 %   Neutro Abs 10.3 (H) 1.7 - 7.7 K/uL   Lymphocytes Relative 19 %   Lymphs Abs 2.5 0.7  - 4.0 K/uL   Monocytes Relative 4 %   Monocytes Absolute 0.6 0.1 - 1.0 K/uL   Eosinophils Relative 0 %   Eosinophils Absolute 0.1 0.0 - 0.7 K/uL   Basophils Relative 0 %   Basophils Absolute 0.1 0.0 - 0.1 K/uL  Salicylate level     Status: None   Collection Time: 07/12/17  3:25 PM  Result Value Ref Range  Salicylate Lvl <8.6 2.8 - 30.0 mg/dL  Acetaminophen level     Status: Abnormal   Collection Time: 07/12/17  3:25 PM  Result Value Ref Range   Acetaminophen (Tylenol), Serum <10 (L) 10 - 30 ug/mL    Comment:        THERAPEUTIC CONCENTRATIONS VARY SIGNIFICANTLY. A RANGE OF 10-30 ug/mL MAY BE AN EFFECTIVE CONCENTRATION FOR MANY PATIENTS. HOWEVER, SOME ARE BEST TREATED AT CONCENTRATIONS OUTSIDE THIS RANGE. ACETAMINOPHEN CONCENTRATIONS >150 ug/mL AT 4 HOURS AFTER INGESTION AND >50 ug/mL AT 12 HOURS AFTER INGESTION ARE OFTEN ASSOCIATED WITH TOXIC REACTIONS.   Urine rapid drug screen (hosp performed)     Status: Abnormal   Collection Time: 07/12/17  3:31 PM  Result Value Ref Range   Opiates NONE DETECTED NONE DETECTED   Cocaine NONE DETECTED NONE DETECTED   Benzodiazepines NONE DETECTED NONE DETECTED   Amphetamines NONE DETECTED NONE DETECTED   Tetrahydrocannabinol POSITIVE (A) NONE DETECTED   Barbiturates NONE DETECTED NONE DETECTED    Comment:        DRUG SCREEN FOR MEDICAL PURPOSES ONLY.  IF CONFIRMATION IS NEEDED FOR ANY PURPOSE, NOTIFY LAB WITHIN 5 DAYS.        LOWEST DETECTABLE LIMITS FOR URINE DRUG SCREEN Drug Class       Cutoff (ng/mL) Amphetamine      1000 Barbiturate      200 Benzodiazepine   761 Tricyclics       950 Opiates          300 Cocaine          300 THC              50     Current Facility-Administered Medications  Medication Dose Route Frequency Provider Last Rate Last Dose  . acetaminophen (TYLENOL) tablet 650 mg  650 mg Oral Q4H PRN Isla Pence, MD   650 mg at 07/14/17 9326  . alum & mag hydroxide-simeth (MAALOX/MYLANTA) 200-200-20  MG/5ML suspension 30 mL  30 mL Oral Q6H PRN Isla Pence, MD      . asenapine (SAPHRIS) sublingual tablet 10 mg  10 mg Sublingual Q12H PRN Juleon Narang, MD   10 mg at 07/13/17 1030  . gabapentin (NEURONTIN) capsule 300 mg  300 mg Oral BID Shakinah Navis, MD   300 mg at 07/14/17 1016  . haloperidol (HALDOL) tablet 2 mg  2 mg Oral BID Vesna Kable, MD   2 mg at 07/14/17 1016  . nicotine (NICODERM CQ - dosed in mg/24 hours) patch 21 mg  21 mg Transdermal Daily Isla Pence, MD   21 mg at 07/14/17 0902  . ondansetron (ZOFRAN) tablet 4 mg  4 mg Oral Q8H PRN Isla Pence, MD      . thiamine (VITAMIN B-1) tablet 100 mg  100 mg Oral Daily Isla Pence, MD   100 mg at 07/14/17 0901   Current Outpatient Prescriptions  Medication Sig Dispense Refill  . methylPREDNISolone (MEDROL DOSEPAK) 4 MG TBPK tablet Take as directed on package (Patient not taking: Reported on 07/13/2017) 21 tablet 0  . traMADol (ULTRAM) 50 MG tablet Take 1-2 tablets (50-100 mg total) by mouth every 6 (six) hours as needed for moderate pain or severe pain. (Patient not taking: Reported on 07/13/2017) 15 tablet 0    Musculoskeletal: Strength & Muscle Tone: within normal limits Gait & Station: normal Patient leans: N/A  Psychiatric Specialty Exam: Physical Exam  Constitutional: He is oriented to person, place, and time. He appears well-developed and well-nourished.  Respiratory: Effort normal.  Musculoskeletal: Normal range of motion.  Neurological: He is alert and oriented to person, place, and time.  Psychiatric: His speech is normal. His mood appears anxious. His affect is angry. He is agitated. Cognition and memory are normal. He expresses impulsivity. He exhibits a depressed mood. He expresses suicidal ideation. He expresses suicidal plans (overdose).    Review of Systems  Psychiatric/Behavioral: Positive for depression, substance abuse and suicidal ideas. Negative for hallucinations and memory loss. The  patient is nervous/anxious. The patient does not have insomnia.   All other systems reviewed and are negative.   Blood pressure 127/85, pulse 69, temperature 98.1 F (36.7 C), temperature source Oral, resp. rate 18, height '5\' 11"'  (1.803 m), weight 99.8 kg (220 lb), SpO2 100 %.Body mass index is 30.68 kg/m.  General Appearance: Disheveled  Eye Contact:  Good  Speech:  Clear and Coherent and Normal Rate  Volume:  Normal  Mood:  Depressed and Euthymic  Affect:  Congruent  Thought Process:  Coherent, Goal Directed and Linear  Orientation:  Full (Time, Place, and Person)  Thought Content:  Logical  Suicidal Thoughts:  No  Homicidal Thoughts:  No  Memory:  Immediate;   Good Recent;   Good Remote;   Fair  Judgement:  Fair  Insight:  Lacking  Psychomotor Activity:  Normal  Concentration:  Concentration: Good and Attention Span: Good  Recall:  Good  Fund of Knowledge:  Good  Language:  Good  Akathisia:  No  Handed:  Right  AIMS (if indicated):     Assets:  Agricultural consultant Housing Resilience  ADL's:  Intact  Cognition:  WNL  Sleep:        Treatment Plan Summary: Plan Substance induced mood disorder  Discharge Home Follow up with [facility] for medication management and therapy Avoid the use of alcohol and illicit drugs Take all medications as prescribed  Disposition: No evidence of imminent risk to self or others at present.   Patient does not meet criteria for psychiatric inpatient admission. Supportive therapy provided about ongoing stressors. Discussed crisis plan, support from social network, calling 911, coming to the Emergency Department, and calling Suicide Hotline. TTS to seek placement  Ethelene Hal, NP 07/14/2017 12:37 PM  Patient seen face-to-face for psychiatric evaluation, chart reviewed and case discussed with the physician extender and developed treatment plan. Reviewed the information documented and agree with the  treatment plan. Corena Pilgrim, MD

## 2017-07-14 NOTE — [facility] Suicide Risk Assessment (Signed)
Suicide Risk Assessment  Discharge Assessment   [facility] Discharge Suicide Risk Assessment   Principal Problem: Substance induced mood disorder [facility]) Discharge Diagnoses:  Patient Active Problem List   Diagnosis Date Noted  . Cannabis use disorder, severe, dependence ([place]) [F12.20] 07/14/2017  . Substance induced mood disorder ([place]) [F19.94] 07/14/2017  . Marijuana abuse, continuous [F12.10] 07/13/2017  . Right shoulder pain [M25.511] 05/28/2017  . Right elbow pain [M25.521] 05/28/2017  . Left shoulder pain [M25.512] 04/28/2017  . S/P ACL reconstruction [W41.324] 08/03/2014  . Unspecified vitamin D deficiency [E55.9] 05/02/2014  . Left leg DVT ([place]) [I82.402] 04/03/2014  . Left fibular fracture [S82.402A] 04/03/2014  . Unspecified asthma(493.90) [M01.027] 04/03/2014  . Smoking [F17.200] 04/03/2014  . Leg pain [M79.606] 04/03/2014    Total Time spent with patient: 45 minutes  Musculoskeletal: Strength & Muscle Tone: within normal limits Gait & Station: normal Patient leans: N/A  Psychiatric Specialty Exam: Physical Exam  Constitutional: He is oriented to person, place, and time. He appears well-developed and well-nourished.  Respiratory: Effort normal.  Musculoskeletal: Normal range of motion.  Neurological: He is alert and oriented to person, place, and time.  Psychiatric: His speech is normal. His mood appears anxious. His affect is angry. He is agitated. Cognition and memory are normal. He expresses impulsivity. He exhibits a depressed mood. He expresses suicidal ideation. He expresses suicidal plans (overdose).   Review of Systems  Psychiatric/Behavioral: Positive for depression, substance abuse and suicidal ideas. Negative for hallucinations and memory loss. The patient is nervous/anxious. The patient does not have insomnia.   All other systems reviewed and are negative.  Blood pressure 127/85, pulse 69, temperature 98.1 F (36.7 C), temperature source Oral, resp. rate 18,  height 5\' 11"  (1.803 m), weight 99.8 kg (220 lb), SpO2 100 %.Body mass index is 30.68 kg/m. General Appearance: Disheveled Eye Contact:  Good Speech:  Clear and Coherent and Normal Rate Volume:  Normal Mood:  Depressed and Euthymic Affect:  Congruent Thought Process:  Coherent, Goal Directed and Linear Orientation:  Full (Time, Place, and Person) Thought Content:  Logical Suicidal Thoughts:  No Homicidal Thoughts:  No Memory:  Immediate;   Good Recent;   Good Remote;   Fair Judgement:  Fair Insight:  Lacking Psychomotor Activity:  Normal Concentration:  Concentration: Good and Attention Span: Good Recall:  Good Fund of Knowledge:  Good Language:  Good Akathisia:  No Handed:  Right AIMS (if indicated):    Assets:  Agricultural consultant Housing Resilience ADL's:  Intact Cognition:  WNL  Mental Status Per Nursing Assessment::   On Admission:    Suicidal ideations with a plan to overdose Demographic Factors:  Male, Low socioeconomic status, Living alone and Unemployed  Loss Factors: Legal issues and Financial problems/change in socioeconomic status  Historical Factors: Impulsivity  Risk Reduction Factors:   Responsible for children under 40 years of age, Sense of responsibility to family and Living with another person, especially a relative  Continued Clinical Symptoms:  Severe Anxiety and/or Agitation Depression:   Impulsivity Alcohol/Substance Abuse/Dependencies More than one psychiatric diagnosis Previous Psychiatric Diagnoses and Treatments  Cognitive Features That Contribute To Risk:  Closed-mindedness    Suicide Risk:  Minimal: No identifiable suicidal ideation.  Patients presenting with no risk factors but with morbid ruminations; may be classified as minimal risk based on the severity of the depressive symptoms    Plan Of Care/Follow-up recommendations:  Activity:  as tolerated Diet:  Heart Healthy  Ethelene Hal, NP 07/14/2017,  12:43 PM

## 2017-10-21 ENCOUNTER — Emergency Department (HOSPITAL_COMMUNITY)
Admission: EM | Admit: 2017-10-21 | Discharge: 2017-10-21 | Disposition: A | Discharge status: Home / Self Care | Payer: Self-pay | Attending: Emergency Medicine | Admitting: Emergency Medicine

## 2017-10-21 ENCOUNTER — Encounter (HOSPITAL_COMMUNITY): Payer: Self-pay | Admitting: Emergency Medicine

## 2017-10-21 DIAGNOSIS — R05 Cough: Secondary | ICD-10-CM | POA: Insufficient documentation

## 2017-10-21 DIAGNOSIS — Z5321 Procedure and treatment not carried out due to patient leaving prior to being seen by health care provider: Secondary | ICD-10-CM | POA: Insufficient documentation

## 2017-10-21 NOTE — ED Notes (Signed)
At the end of triage, after patient was told to go back out into the lobby to wait for a room, patient states "my chest is really hurting man." Offered to do an EKG and work patient up for chest pain. Patient walked into the lobby stating "I don't have time for this, man."

## 2017-10-21 NOTE — ED Triage Notes (Signed)
Patient c/o cough and congestion with hx asthma. States he does not have an inhaler. Speaking in full sentences without difficulty.

## 2017-10-22 ENCOUNTER — Emergency Department (HOSPITAL_COMMUNITY): Payer: Self-pay

## 2017-10-22 ENCOUNTER — Encounter (HOSPITAL_COMMUNITY): Payer: Self-pay

## 2017-10-22 ENCOUNTER — Other Ambulatory Visit: Payer: Self-pay

## 2017-10-22 ENCOUNTER — Emergency Department (HOSPITAL_COMMUNITY)
Admission: EM | Admit: 2017-10-22 | Discharge: 2017-10-22 | Disposition: A | Discharge status: Home / Self Care | Payer: Self-pay | Attending: Emergency Medicine | Admitting: Emergency Medicine

## 2017-10-22 DIAGNOSIS — B349 Viral infection, unspecified: Secondary | ICD-10-CM | POA: Insufficient documentation

## 2017-10-22 DIAGNOSIS — Z79899 Other long term (current) drug therapy: Secondary | ICD-10-CM | POA: Insufficient documentation

## 2017-10-22 DIAGNOSIS — J45909 Unspecified asthma, uncomplicated: Secondary | ICD-10-CM | POA: Insufficient documentation

## 2017-10-22 DIAGNOSIS — Z720 Tobacco use: Secondary | ICD-10-CM

## 2017-10-22 DIAGNOSIS — F17210 Nicotine dependence, cigarettes, uncomplicated: Secondary | ICD-10-CM | POA: Insufficient documentation

## 2017-10-22 MED ORDER — IPRATROPIUM-ALBUTEROL 0.5-2.5 (3) MG/3ML IN SOLN
3.0000 mL | Freq: Once | RESPIRATORY_TRACT | Status: AC
  Administered 2017-10-22: 3 mL via RESPIRATORY_TRACT
  Filled 2017-10-22: qty 3

## 2017-10-22 MED ORDER — ALBUTEROL SULFATE HFA 108 (90 BASE) MCG/ACT IN AERS: 1.0000 | INHALATION_SPRAY | Freq: Four times a day (QID) | RESPIRATORY_TRACT | 0 refills | Status: DC | PRN

## 2017-10-22 MED ORDER — BENZONATATE 100 MG PO CAPS: 100.0000 mg | ORAL_CAPSULE | Freq: Three times a day (TID) | ORAL | 0 refills | Status: DC | PRN

## 2017-10-22 MED ORDER — FLUTICASONE PROPIONATE 50 MCG/ACT NA SUSP: 1.0000 | Freq: Every day | NASAL | 2 refills | Status: DC

## 2017-10-22 MED ORDER — ALBUTEROL SULFATE HFA 108 (90 BASE) MCG/ACT IN AERS
1.0000 | INHALATION_SPRAY | Freq: Four times a day (QID) | RESPIRATORY_TRACT | Status: DC | PRN
  Administered 2017-10-22: 1 via RESPIRATORY_TRACT
  Filled 2017-10-22: qty 6.7

## 2017-10-22 MED ORDER — IBUPROFEN 800 MG PO TABS: 800.0000 mg | ORAL_TABLET | Freq: Three times a day (TID) | ORAL | 0 refills | Status: DC

## 2017-10-22 NOTE — ED Notes (Signed)
Patient transported to X-ray 
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
## 2017-10-22 NOTE — ED Triage Notes (Signed)
Pt states increased shortness of breath X1 week. Hx of asthma. Was seen at [facility] yesterday for cough and congestion. Pt states he has been smoking a lot lately.

## 2017-10-22 NOTE — Discharge Instructions (Signed)
You were seen in the emergency department for cough, congestion, and some difficulty breathing.  Your chest x-ray did not show any pneumonia.  You received a breathing treatment while in the emergency department with improvement.   You were diagnosed with a viral illness.  I have prescribed you multiple medications to treat your symptoms.   -Flonase to be used 1 spray in each nostril daily.  This medication is used to treat your congestion.  -Tessalon can be taken once every 8 hours as needed.  This medication is used to treat your cough.  -Ibuprofen to be taken once every 8 hours as needed for pain.  - Inhaler to be used 1-2 puffs as needed every 6 hours.   You will need to follow-up with your primary care provider in 1 week if your symptoms have not improved.  If you do not have a primary care provider one is provided in your discharge instructions.  Return to the emergency department for any new or worsening symptoms including but not limited to difficulty breathing, chest pain, or passing out.  It is also important that you continue to try to stop smoking, this will help with your current symptoms and help to lessen future illness.

## 2018-02-07 ENCOUNTER — Emergency Department (HOSPITAL_COMMUNITY)
Admission: EM | Admit: 2018-02-07 | Discharge: 2018-02-07 | Disposition: A | Discharge status: Home / Self Care | Payer: Self-pay | Attending: Emergency Medicine | Admitting: Emergency Medicine

## 2018-02-07 ENCOUNTER — Encounter (HOSPITAL_COMMUNITY): Payer: Self-pay | Admitting: Emergency Medicine

## 2018-02-07 ENCOUNTER — Other Ambulatory Visit: Payer: Self-pay

## 2018-02-07 DIAGNOSIS — J45909 Unspecified asthma, uncomplicated: Secondary | ICD-10-CM | POA: Insufficient documentation

## 2018-02-07 DIAGNOSIS — Z79899 Other long term (current) drug therapy: Secondary | ICD-10-CM | POA: Insufficient documentation

## 2018-02-07 DIAGNOSIS — Z91040 Latex allergy status: Secondary | ICD-10-CM | POA: Insufficient documentation

## 2018-02-07 DIAGNOSIS — F17210 Nicotine dependence, cigarettes, uncomplicated: Secondary | ICD-10-CM | POA: Insufficient documentation

## 2018-02-07 DIAGNOSIS — K0889 Other specified disorders of teeth and supporting structures: Secondary | ICD-10-CM | POA: Insufficient documentation

## 2018-02-07 MED ORDER — BENZOCAINE 10 % MT GEL
Freq: Once | OROMUCOSAL | Status: AC
Start: 2018-02-07 — End: 2018-02-07
  Administered 2018-02-07: 11:00:00 via OROMUCOSAL
  Filled 2018-02-07: qty 9

## 2018-02-07 MED ORDER — BUPIVACAINE-EPINEPHRINE (PF) 0.5% -1:200000 IJ SOLN
1.8000 mL | Freq: Once | INTRAMUSCULAR | Status: AC
  Administered 2018-02-07: 1.8 mL

## 2018-02-07 MED ORDER — PENICILLIN V POTASSIUM 500 MG PO TABS: 500.0000 mg | ORAL_TABLET | Freq: Four times a day (QID) | ORAL | 0 refills | Status: AC

## 2018-02-07 MED ORDER — BENZOCAINE 20 % MT PSTE
PASTE | Freq: Once | OROMUCOSAL | Status: DC
  Filled 2018-02-07: qty 11.9

## 2018-02-07 NOTE — Discharge Instructions (Signed)
Please take Ibuprofen (Advil, motrin) and Tylenol (acetaminophen) to relieve your pain.  You may take up to 600 MG (3 pills) of normal strength ibuprofen every 8 hours as needed.  In between doses of ibuprofen you make take tylenol, up to 1,000 mg (two extra strength pills).  Do not take more than 3,000 mg tylenol in a 24 hour period.  Please check all medication labels as many medications such as pain and cold medications may contain tylenol.  Do not drink alcohol while taking these medications.  Do not take other NSAID'S while taking ibuprofen (such as aleve or naproxen).  Please take ibuprofen with food to decrease stomach upset.  Please make sure not to bite your tongue or lip while numb.

## 2018-02-07 NOTE — ED Provider Notes (Signed)
[facility] EMERGENCY DEPARTMENT Provider Note   CSN: [id number] Arrival date & time: 02/07/18  6295     History   Chief Complaint Chief Complaint  Patient presents with  . Dental Problem    HPI Douglas Sloan is a 37 y.o. male with a history of schizophrenia, bipolar, dental problems who presents today for evaluation of dental pain.  He reports that it has been gradually worsening over the past 3 months, however last night he denies any nausea vomiting diarrhea, no fevers or chills.  He reports that in the past when he has had dental problems he was given a referral from the emergency room to a dentist where he did not have to pay any money to be seen.  He is requesting a dental block and referral to a free dentist again today.  HPI  Past Medical History:  Diagnosis Date  . Asthma    denies meds  . Bipolar disorder ([place])    denies taking any meds  . Chronic left shoulder pain   . DVT (deep venous thrombosis) ([place])    denies taking xarelto x1 month.  states they plan to put me on some after surgery.  . Schizophrenia [facility])    denies taking any meds    Patient Active Problem List   Diagnosis Date Noted  . Cannabis use disorder, severe, dependence ([place]) 07/14/2017  . Substance induced mood disorder ([place]) 07/14/2017  . Marijuana abuse, continuous 07/13/2017  . Right shoulder pain 05/28/2017  . Right elbow pain 05/28/2017  . Left shoulder pain 04/28/2017  . S/P ACL reconstruction 08/03/2014  . Unspecified vitamin D deficiency 05/02/2014  . Left leg DVT ([place]) 04/03/2014  . Left fibular fracture 04/03/2014  . Unspecified asthma(493.90) 04/03/2014  . Smoking 04/03/2014  . Leg pain 04/03/2014    Past Surgical History:  Procedure Laterality Date  . ANTERIOR CRUCIATE LIGAMENT REPAIR Left 08/03/2014   Procedure: LEFT ALLOGRAFT ACL RECONSTRUCTION/;  Surgeon: Sydnee Cabal, MD;  Location: [facility];  Service: Orthopedics;  Laterality: Left;    . APPENDECTOMY    . FACIAL COSMETIC SURGERY     reconstructive  . KNEE ARTHROSCOPY Left 08/03/2014   Procedure: LEFT ARTHROSCOPY KNEE WITH DEBRIDEMENT;  Surgeon: Sydnee Cabal, MD;  Location: [facility];  Service: Orthopedics;  Laterality: Left;        Home Medications    Prior to Admission medications   Medication Sig Start Date End Date Taking? Authorizing Provider  albuterol (PROVENTIL HFA;VENTOLIN HFA) 108 (90 Base) MCG/ACT inhaler Inhale 1-2 puffs into the lungs every 6 (six) hours as needed for wheezing or shortness of breath. 10/22/17   Petrucelli, Samantha R, PA-C  benzonatate (TESSALON) 100 MG capsule Take 1 capsule (100 mg total) by mouth 3 (three) times daily as needed for cough. 10/22/17   Petrucelli, Samantha R, PA-C  fluticasone (FLONASE) 50 MCG/ACT nasal spray Place 1 spray into both nostrils daily. 10/22/17   Petrucelli, Samantha R, PA-C  ibuprofen (ADVIL,MOTRIN) 800 MG tablet Take 1 tablet (800 mg total) by mouth 3 (three) times daily. 10/22/17   Petrucelli, Samantha R, PA-C  penicillin v potassium (VEETID) 500 MG tablet Take 1 tablet (500 mg total) by mouth 4 (four) times daily for 7 days. 02/07/18 02/14/18  Lorin Glass, PA-C    Family History Family History  Problem Relation Age of Onset  . Diabetes Other   . Heart failure Other   . Diabetes Paternal Uncle  Social History Social History   Tobacco Use  . Smoking status: Current Every Day Smoker    Packs/day: 0.50    Years: 10.00    Pack years: 5.00    Types: Cigarettes  . Smokeless tobacco: Never Used  Substance Use Topics  . Alcohol use: Yes  . Drug use: No     Allergies   Latex   Review of Systems Review of Systems  Constitutional: Negative for chills and fever.  HENT: Positive for dental problem. Negative for drooling and facial swelling.   Gastrointestinal: Negative for diarrhea, nausea and vomiting.  Musculoskeletal: Negative for neck pain.     Physical  Exam Updated Vital Signs BP (!) 142/97 (BP Location: Right Arm)   Pulse 63   Temp 98.7 F (37.1 C) (Oral)   Resp 18   Ht 5\' 11"  (1.803 m)   Wt 102.1 kg (225 lb)   SpO2 100%   BMI 31.38 kg/m   Physical Exam  Constitutional: He appears well-developed and well-nourished.  HENT:  Head: Normocephalic and atraumatic.  General dentition is in a poor state.  There is multiple cavities, broken teeth.  He has no obvious swelling, fluctuance, or dental abscess.  Uvula is midline, tonsils are not enlarged. The painful tooth is on the left lower mandible, of the remaining teeth that is the furthest back.  Eyes: Conjunctivae are normal.  Neck: Normal range of motion. Neck supple.  Pulmonary/Chest: No respiratory distress.  Skin: He is not diaphoretic.  Nursing note and vitals reviewed.    ED Treatments / Results  Labs (all labs ordered are listed, but only abnormal results are displayed) Labs Reviewed - No data to display  EKG None  Radiology No results found.  Procedures Dental Block Date/Time: 02/07/2018 11:17 AM Performed by: Lorin Glass, PA-C Authorized by: Lorin Glass, PA-C   Consent:    Consent obtained:  Verbal   Consent given by:  Patient   Risks discussed:  Allergic reaction, infection, hematoma, intravascular injection, unsuccessful block, swelling, pain and nerve damage (Spread of infection, injection in to unintended areas, other complications that were not explicitely discussed.)   Alternatives discussed:  No treatment, alternative treatment and referral Indications:    Indications: dental pain   Location:    Block type:  Inferior alveolar   Laterality:  Left Procedure details (see MAR for exact dosages):    Topical anesthetic:  Benzocaine gel   Syringe type:  Controlled syringe   Needle gauge:  27 G   Anesthetic injected:  Bupivacaine 0.5% WITH epi   Injection procedure:  Anatomic landmarks identified, introduced needle, incremental  injection, anatomic landmarks palpated and negative aspiration for blood Post-procedure details:    Outcome:  Pain unchanged   Patient tolerance of procedure:  Tolerated well, no immediate complications Comments:     Patient moved slightly while attempting injection, had to be reminded of the risks of moving.   After procedure patient did not report significant pain relief, he was offered a supraperiosteal block or addition of lidocaine and he declined.    (including critical care time)  Medications Ordered in ED Medications  bupivacaine-epinephrine (MARCAINE W/ EPI) 0.5% -1:200000 injection 1.8 mL (has no administration in time range)  benzocaine (ORAJEL) 10 % mucosal gel (has no administration in time range)     Initial Impression / Assessment and Plan / ED Course  I have reviewed the triage vital signs and the nursing notes.  Pertinent labs & imaging results that were  available during my care of the patient were reviewed by me and considered in my medical decision making (see chart for details).    Patient presents today for evaluation of dental pain, is requesting a dental block referral to a dentist where he does not have to pay anything.  Informed him that there is no dentist on call today so I am able to provide him with the dental resource list, he voiced being upset that he will have to pay to be seen.  He requested a dental box and something stronger than ibuprofen Tylenol, I informed him that we do not routinely provide narcotic pain medicine for dental pain.  He requested a dental block stating that last time it helped him.  We discussed the risks and benefits, including damage to other structures and that the block would not work and he still wished to proceed.  Dental block was performed.  Patient reported that he did not have a significant pain relief from the dental block.  He was offered a supraperiosteal dental block, or the addition of lidocaine to help provide faster  relief, and he declined this.  Patient discharged home with instructions on alternating ibuprofen and Tylenol, prescription for penicillin.  I do not suspect spread of infection or deep space infection.  Given dental resource list.      Final Clinical Impressions(s) / ED Diagnoses   Final diagnoses:  Pain, dental    ED Discharge Orders        Ordered    penicillin v potassium (VEETID) 500 MG tablet  4 times daily     02/07/18 1112       Ollen Gross 02/07/18 1122    Carmin Muskrat, MD 02/07/18 1555

## 2018-02-07 NOTE — ED Triage Notes (Signed)
P[t. Stated, I have a tooth problem for 3 weeks. Its in my nerve.

## 2018-04-17 ENCOUNTER — Emergency Department (HOSPITAL_COMMUNITY)
Admission: EM | Admit: 2018-04-17 | Discharge: 2018-04-17 | Disposition: A | Discharge status: Home / Self Care | Payer: Self-pay | Attending: Emergency Medicine | Admitting: Emergency Medicine

## 2018-04-17 ENCOUNTER — Encounter (HOSPITAL_COMMUNITY): Payer: Self-pay | Admitting: Emergency Medicine

## 2018-04-17 ENCOUNTER — Other Ambulatory Visit: Payer: Self-pay

## 2018-04-17 ENCOUNTER — Emergency Department (HOSPITAL_COMMUNITY): Payer: Self-pay

## 2018-04-17 DIAGNOSIS — W2209XA Striking against other stationary object, initial encounter: Secondary | ICD-10-CM | POA: Insufficient documentation

## 2018-04-17 DIAGNOSIS — J45909 Unspecified asthma, uncomplicated: Secondary | ICD-10-CM | POA: Insufficient documentation

## 2018-04-17 DIAGNOSIS — S60221A Contusion of right hand, initial encounter: Secondary | ICD-10-CM | POA: Insufficient documentation

## 2018-04-17 DIAGNOSIS — Y939 Activity, unspecified: Secondary | ICD-10-CM | POA: Insufficient documentation

## 2018-04-17 DIAGNOSIS — Y999 Unspecified external cause status: Secondary | ICD-10-CM | POA: Insufficient documentation

## 2018-04-17 DIAGNOSIS — Z79899 Other long term (current) drug therapy: Secondary | ICD-10-CM | POA: Insufficient documentation

## 2018-04-17 DIAGNOSIS — F17210 Nicotine dependence, cigarettes, uncomplicated: Secondary | ICD-10-CM | POA: Insufficient documentation

## 2018-04-17 DIAGNOSIS — Y929 Unspecified place or not applicable: Secondary | ICD-10-CM | POA: Insufficient documentation

## 2018-04-17 DIAGNOSIS — Z91040 Latex allergy status: Secondary | ICD-10-CM | POA: Insufficient documentation

## 2018-04-17 MED ORDER — IBUPROFEN 800 MG PO TABS
800.0000 mg | ORAL_TABLET | Freq: Once | ORAL | Status: AC
  Administered 2018-04-17: 800 mg via ORAL
  Filled 2018-04-17: qty 1
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
## 2018-04-17 NOTE — Discharge Instructions (Addendum)
Take ibuprofen or Tylenol for pain.  Ice and elevate your hand.  Ace wrap for compression.  Follow-up in 1 week if not improving

## 2018-04-17 NOTE — ED Provider Notes (Signed)
[place] DEPT Provider Note   CSN: [id number] Arrival date & time: 04/17/18  0046     History   Chief Complaint Chief Complaint  Patient presents with  . Hand Injury    HPI Douglas Sloan is a 37 y.o. male.  HPI Douglas Sloan is a 37 y.o. male with history of asthma, bipolar disorder, DVT, schizophrenia, chronic pain, presents to emergency department with complaint of right hand pain.  Patient states he punched a wall with right hand. Reports pain and swelling to the knuckles of the hand. No difficulty moving it. Ice it. No other treatment.   Past Medical History:  Diagnosis Date  . Asthma    denies meds  . Bipolar disorder ([place])    denies taking any meds  . Chronic left shoulder pain   . DVT (deep venous thrombosis) ([place])    denies taking xarelto x1 month.  states they plan to put me on some after surgery.  . Schizophrenia [facility])    denies taking any meds    Patient Active Problem List   Diagnosis Date Noted  . Cannabis use disorder, severe, dependence ([place]) 07/14/2017  . Substance induced mood disorder ([place]) 07/14/2017  . Marijuana abuse, continuous 07/13/2017  . Right shoulder pain 05/28/2017  . Right elbow pain 05/28/2017  . Left shoulder pain 04/28/2017  . S/P ACL reconstruction 08/03/2014  . Unspecified vitamin D deficiency 05/02/2014  . Left leg DVT ([place]) 04/03/2014  . Left fibular fracture 04/03/2014  . Unspecified asthma(493.90) 04/03/2014  . Smoking 04/03/2014  . Leg pain 04/03/2014    Past Surgical History:  Procedure Laterality Date  . ANTERIOR CRUCIATE LIGAMENT REPAIR Left 08/03/2014   Procedure: LEFT ALLOGRAFT ACL RECONSTRUCTION/;  Surgeon: Sydnee Cabal, MD;  Location: [facility];  Service: Orthopedics;  Laterality: Left;  . APPENDECTOMY    . FACIAL COSMETIC SURGERY     reconstructive  . KNEE ARTHROSCOPY Left 08/03/2014   Procedure: LEFT ARTHROSCOPY KNEE WITH DEBRIDEMENT;  Surgeon: Sydnee Cabal,  MD;  Location: [facility];  Service: Orthopedics;  Laterality: Left;        Home Medications    Prior to Admission medications   Medication Sig Start Date End Date Taking? Authorizing Provider  ibuprofen (ADVIL,MOTRIN) 800 MG tablet Take 1 tablet (800 mg total) by mouth 3 (three) times daily. 10/22/17  Yes Petrucelli, Samantha R, PA-C  albuterol (PROVENTIL HFA;VENTOLIN HFA) 108 (90 Base) MCG/ACT inhaler Inhale 1-2 puffs into the lungs every 6 (six) hours as needed for wheezing or shortness of breath. 10/22/17   Petrucelli, Glynda Jaeger, PA-C    Family History Family History  Problem Relation Age of Onset  . Diabetes Other   . Heart failure Other   . Diabetes Paternal Uncle     Social History Social History   Tobacco Use  . Smoking status: Current Every Day Smoker    Packs/day: 0.50    Years: 10.00    Pack years: 5.00    Types: Cigarettes  . Smokeless tobacco: Never Used  Substance Use Topics  . Alcohol use: Yes  . Drug use: No     Allergies   Latex   Review of Systems Review of Systems  Constitutional: Negative for chills and fever.  Respiratory: Negative for cough, chest tightness and shortness of breath.   Cardiovascular: Negative for chest pain, palpitations and leg swelling.  Musculoskeletal: Positive for arthralgias and joint swelling. Negative for myalgias, neck pain and neck stiffness.  Skin: Negative for rash.  Allergic/Immunologic: Negative for immunocompromised state.  Neurological: Negative for weakness and numbness.     Physical Exam Updated Vital Signs BP (!) 145/82 (BP Location: Right Arm)   Pulse 94   Temp 98 F (36.7 C) (Oral)   Resp 18   Ht 5\' 11"  (1.803 m)   Wt 102.1 kg (225 lb)   SpO2 100%   BMI 31.38 kg/m   Physical Exam  Constitutional: He is oriented to person, place, and time. He appears well-developed and well-nourished. No distress.  HENT:  Head: Normocephalic and atraumatic.  Eyes: Conjunctivae are normal.    Neck: Neck supple.  Cardiovascular: Normal rate, regular rhythm and normal heart sounds.  Musculoskeletal: He exhibits no edema.  Swelling noted to the third and fifth MCP joints of the right hand over the dorsal surface.  Tenderness to palpation noted over the joints.  Full range of motion of all fingers, patient is able to make a fist.  No malrotation of the fingers noted.  Able to spread all the fingers.  Normal wrist.  Capillary refill less than 2 seconds distally  Neurological: He is alert and oriented to person, place, and time.  Skin: Skin is warm and dry.  Nursing note and vitals reviewed.    ED Treatments / Results  Labs (all labs ordered are listed, but only abnormal results are displayed) Labs Reviewed - No data to display  EKG None  Radiology Dg Hand Complete Right  Result Date: 04/17/2018 CLINICAL DATA:  Patient struck a wall. Right knuckles are swollen. Pain in the middle finger. EXAM: RIGHT HAND - COMPLETE 3+ VIEW COMPARISON:  04/22/2016 FINDINGS: Right hand appears intact. No evidence of acute fracture or subluxation. No focal bone lesion or bone destruction. Bone cortex and trabecular architecture appear intact. No radiopaque soft tissue foreign bodies. Soft tissue swelling over the metacarpal heads. IMPRESSION: No acute fracture or dislocation identified. Soft tissue swelling over the metacarpal heads. Electronically Signed   By: Lucienne Capers M.D.   On: 04/17/2018 01:43    Procedures Procedures (including critical care time)  Medications Ordered in ED Medications  ibuprofen (ADVIL,MOTRIN) tablet 800 mg (has no administration in time range)     Initial Impression / Assessment and Plan / ED Course  I have reviewed the triage vital signs and the nursing notes.  Pertinent labs & imaging results that were available during my care of the patient were reviewed by me and considered in my medical decision making (see chart for details).     Patient emergency  department with right hand pain after punching a wall.  X-rays negative.  Ace wrap applied.  Ibuprofen given for pain.  Home with ice, elevation, compression, NSAIDs, instructed to repeat x-rays if pain is not improving in 1 week.  Vitals:   04/17/18 0051 04/17/18 0055  BP: (!) 145/82   Pulse: 94   Resp: 18   Temp: 98 F (36.7 C)   TempSrc: Oral   SpO2: 100%   Weight:  102.1 kg (225 lb)  Height:  5\' 11"  (1.803 m)     Final Clinical Impressions(s) / ED Diagnoses   Final diagnoses:  Contusion of right hand, initial encounter    ED Discharge Orders    None       Jeannett Senior, PA-C 04/17/18 0302    Molpus, Jenny Reichmann, MD 04/17/18 [phone]

## 2018-04-17 NOTE — ED Notes (Signed)
Patient transported to X-ray 

## 2018-06-21 ENCOUNTER — Other Ambulatory Visit: Payer: Self-pay

## 2018-06-21 ENCOUNTER — Encounter: Payer: Self-pay | Admitting: Emergency Medicine

## 2018-06-21 ENCOUNTER — Emergency Department: Payer: Self-pay

## 2018-06-21 DIAGNOSIS — Z5321 Procedure and treatment not carried out due to patient leaving prior to being seen by health care provider: Secondary | ICD-10-CM | POA: Insufficient documentation

## 2018-06-21 DIAGNOSIS — R05 Cough: Secondary | ICD-10-CM | POA: Insufficient documentation

## 2018-06-21 NOTE — ED Triage Notes (Signed)
Pt arrived to the ED accompanied by his significant other for complaints of cough, shortness of breath and chest tightness. Pt reports that he has bronchitis and lost his inhaler. Pt is AOx4 in no apparent distress speaking in complete sentences.

## 2018-06-22 ENCOUNTER — Emergency Department
Admission: EM | Admit: 2018-06-22 | Discharge: 2018-06-22 | Disposition: A | Discharge status: Home / Self Care | Payer: Self-pay | Attending: Emergency Medicine | Admitting: Emergency Medicine

## 2018-06-22 NOTE — ED Notes (Signed)
Pt ambulatory to STAT desk without difficulty or distress noted; pt reports that he just needs an inhaler to go home as he is out of his; informed pt that he does need a prescription for such and is the next patient to go an exam room

## 2018-07-08 ENCOUNTER — Emergency Department
Admission: EM | Admit: 2018-07-08 | Discharge: 2018-07-09 | Disposition: A | Discharge status: Home / Self Care | Payer: Self-pay | Attending: Emergency Medicine | Admitting: Emergency Medicine

## 2018-07-08 ENCOUNTER — Other Ambulatory Visit: Payer: Self-pay

## 2018-07-08 DIAGNOSIS — K0889 Other specified disorders of teeth and supporting structures: Secondary | ICD-10-CM

## 2018-07-08 DIAGNOSIS — S025XXA Fracture of tooth (traumatic), initial encounter for closed fracture: Secondary | ICD-10-CM

## 2018-07-08 DIAGNOSIS — X58XXXA Exposure to other specified factors, initial encounter: Secondary | ICD-10-CM | POA: Insufficient documentation

## 2018-07-08 DIAGNOSIS — Z91040 Latex allergy status: Secondary | ICD-10-CM | POA: Insufficient documentation

## 2018-07-08 DIAGNOSIS — G8929 Other chronic pain: Secondary | ICD-10-CM

## 2018-07-08 DIAGNOSIS — Y998 Other external cause status: Secondary | ICD-10-CM | POA: Insufficient documentation

## 2018-07-08 DIAGNOSIS — J45909 Unspecified asthma, uncomplicated: Secondary | ICD-10-CM | POA: Insufficient documentation

## 2018-07-08 DIAGNOSIS — Y929 Unspecified place or not applicable: Secondary | ICD-10-CM | POA: Insufficient documentation

## 2018-07-08 DIAGNOSIS — Y939 Activity, unspecified: Secondary | ICD-10-CM | POA: Insufficient documentation

## 2018-07-08 DIAGNOSIS — M25512 Pain in left shoulder: Secondary | ICD-10-CM | POA: Insufficient documentation

## 2018-07-08 DIAGNOSIS — F17210 Nicotine dependence, cigarettes, uncomplicated: Secondary | ICD-10-CM | POA: Insufficient documentation

## 2018-07-08 MED ORDER — AMOXICILLIN 500 MG PO CAPS: 500.0000 mg | ORAL_CAPSULE | Freq: Three times a day (TID) | ORAL | 0 refills | Status: DC

## 2018-07-08 MED ORDER — OXYCODONE-ACETAMINOPHEN 5-325 MG PO TABS
1.0000 | ORAL_TABLET | Freq: Once | ORAL | Status: AC
  Administered 2018-07-09: 1 via ORAL
  Filled 2018-07-08: qty 1

## 2018-07-08 MED ORDER — LIDOCAINE VISCOUS HCL 2 % MT SOLN
15.0000 mL | Freq: Once | OROMUCOSAL | Status: AC
  Administered 2018-07-09: 15 mL via OROMUCOSAL
  Filled 2018-07-08: qty 15

## 2018-07-08 MED ORDER — AMOXICILLIN 500 MG PO CAPS
500.0000 mg | ORAL_CAPSULE | Freq: Once | ORAL | Status: AC
  Administered 2018-07-09: 500 mg via ORAL
  Filled 2018-07-08: qty 1

## 2018-07-08 MED ORDER — OXYCODONE-ACETAMINOPHEN 5-325 MG PO TABS: 1.0000 | ORAL_TABLET | ORAL | 0 refills | Status: DC | PRN

## 2018-07-08 NOTE — Discharge Instructions (Signed)
1.  Take antibiotic as prescribed (Amoxicillin 500 mg 3 times daily x7 days). 2.  You may continue Ibuprofen as needed for pain; take Percocet as needed for more severe pain. 3.  Wear sling as needed for comfort. 4.  Return to the ER for worsening symptoms, persistent vomiting, difficulty breathing or other concerns.

## 2018-07-08 NOTE — ED Provider Notes (Signed)
[facility] Emergency Department Provider Note   ____________________________________________   First MD Initiated Contact with Patient 07/08/18 2347     (approximate)  I have reviewed the triage vital signs and the nursing notes.   HISTORY  Chief Complaint Dental Pain    HPI Douglas Sloan is a 37 y.o. male who presents to the ED from home with a chief complaint of toothache and left shoulder pain.  Patient reports left lower molar toothache for the past 3 months due to a broken tooth.  Also complains of left shoulder pain for the past 6 months due to a torn rotator cuff.  States he was seen in the ED for both of these but "they did not give me follow-up".  Both have increased in pain over the past several days.  Denies associated fever, chills, chest pain, shortness of breath, abdominal pain, nausea or vomiting.  Denies recent travel or trauma.   Past Medical History:  Diagnosis Date  . Asthma    denies meds  . Bipolar disorder ([place])    denies taking any meds  . Chronic left shoulder pain   . DVT (deep venous thrombosis) ([place])    denies taking xarelto x1 month.  states they plan to put me on some after surgery.  . Schizophrenia [facility])    denies taking any meds    Patient Active Problem List   Diagnosis Date Noted  . Cannabis use disorder, severe, dependence ([place]) 07/14/2017  . Substance induced mood disorder ([place]) 07/14/2017  . Marijuana abuse, continuous 07/13/2017  . Right shoulder pain 05/28/2017  . Right elbow pain 05/28/2017  . Left shoulder pain 04/28/2017  . S/P ACL reconstruction 08/03/2014  . Unspecified vitamin D deficiency 05/02/2014  . Left leg DVT ([place]) 04/03/2014  . Left fibular fracture 04/03/2014  . Unspecified asthma(493.90) 04/03/2014  . Smoking 04/03/2014  . Leg pain 04/03/2014    Past Surgical History:  Procedure Laterality Date  . ANTERIOR CRUCIATE LIGAMENT REPAIR Left 08/03/2014   Procedure: LEFT ALLOGRAFT ACL  RECONSTRUCTION/;  Surgeon: Sydnee Cabal, MD;  Location: [facility];  Service: Orthopedics;  Laterality: Left;  . APPENDECTOMY    . FACIAL COSMETIC SURGERY     reconstructive  . KNEE ARTHROSCOPY Left 08/03/2014   Procedure: LEFT ARTHROSCOPY KNEE WITH DEBRIDEMENT;  Surgeon: Sydnee Cabal, MD;  Location: [facility];  Service: Orthopedics;  Laterality: Left;    Prior to Admission medications   Medication Sig Start Date End Date Taking? Authorizing Provider  albuterol (PROVENTIL HFA;VENTOLIN HFA) 108 (90 Base) MCG/ACT inhaler Inhale 1-2 puffs into the lungs every 6 (six) hours as needed for wheezing or shortness of breath. 10/22/17   Petrucelli, Samantha R, PA-C  amoxicillin (AMOXIL) 500 MG capsule Take 1 capsule (500 mg total) by mouth 3 (three) times daily. 07/08/18   Paulette Blanch, MD  ibuprofen (ADVIL,MOTRIN) 800 MG tablet Take 1 tablet (800 mg total) by mouth 3 (three) times daily. 10/22/17   Petrucelli, Glynda Jaeger, PA-C  oxyCODONE-acetaminophen (PERCOCET/ROXICET) 5-325 MG tablet Take 1 tablet by mouth every 4 (four) hours as needed for severe pain. 07/08/18   Paulette Blanch, MD    Allergies Latex  Family History  Problem Relation Age of Onset  . Diabetes Other   . Heart failure Other   . Diabetes Paternal Uncle     Social History Social History   Tobacco Use  . Smoking status: Current Every Day Smoker    Packs/day:  0.50    Years: 10.00    Pack years: 5.00    Types: Cigarettes  . Smokeless tobacco: Never Used  Substance Use Topics  . Alcohol use: Yes  . Drug use: No    Review of Systems  Constitutional: No fever/chills Eyes: No visual changes. ENT: Positive for dentalgia.  No sore throat. Cardiovascular: Denies chest pain. Respiratory: Denies shortness of breath. Gastrointestinal: No abdominal pain.  No nausea, no vomiting.  No diarrhea.  No constipation. Genitourinary: Negative for dysuria. Musculoskeletal: Positive for left shoulder  pain.  Negative for back pain. Skin: Negative for rash. Neurological: Negative for headaches, focal weakness or numbness.   ____________________________________________   PHYSICAL EXAM:  VITAL SIGNS: ED Triage Vitals  Enc Vitals Group     BP 07/08/18 2253 (!) 145/82     Pulse Rate 07/08/18 2253 80     Resp 07/08/18 2253 18     Temp 07/08/18 2253 98.2 F (36.8 C)     Temp Source 07/08/18 2253 Oral     SpO2 07/08/18 2253 100 %     Weight 07/08/18 2254 225 lb (102.1 kg)     Height 07/08/18 2254 5\' 11"  (1.803 m)     Head Circumference --      Peak Flow --      Pain Score 07/08/18 2254 10     Pain Loc --      Pain Edu? --      Excl. in [place]? --     Constitutional: Alert and oriented. Well appearing and in no acute distress. Eyes: Conjunctivae are normal. PERRL. EOMI. Head: Atraumatic. Nose: No congestion/rhinnorhea. Mouth/Throat: Mucous membranes are moist.  Broken left lower molar which is tender to palpation with tongue blade without surrounding abscess.  No intra-or extraoral swelling. Neck: No stridor.  No cervical spine tenderness to palpation. Cardiovascular: Normal rate, regular rhythm. Grossly normal heart sounds.  Good peripheral circulation. Respiratory: Normal respiratory effort.  No retractions. Lungs CTAB. Gastrointestinal: Soft and nontender. No distention. No abdominal bruits. No CVA tenderness. Musculoskeletal: Left shoulder tender to palpation.  Limited range of motion secondary to pain.  2+ radial pulse.  Brisk, less than 5-second capillary refill. Neurologic:  Normal speech and language. No gross focal neurologic deficits are appreciated. No gait instability. Skin:  Skin is warm, dry and intact. No rash noted. Psychiatric: Mood and affect are normal. Speech and behavior are normal.  ____________________________________________   LABS (all labs ordered are listed, but only abnormal results are displayed)  Labs Reviewed - No data to  display ____________________________________________  EKG  None ____________________________________________  RADIOLOGY  ED MD interpretation: None  Official radiology report(s): No results found.  ____________________________________________   PROCEDURES  Procedure(s) performed: None  Procedures  Critical Care performed: No  ____________________________________________   INITIAL IMPRESSION / ASSESSMENT AND PLAN / ED COURSE  As part of my medical decision making, I reviewed the following data within the [facility] notes reviewed and incorporated and Notes from prior ED visits   37 year old male who presents with dentalgia and left shoulder pain.  Will place on amoxicillin, analgesia, sling and refer to dentistry and orthopedics.  Strict return precautions given.  Patient and family member verbalize understanding and agree with plan of care.      ____________________________________________   FINAL CLINICAL IMPRESSION(S) / ED DIAGNOSES  Final diagnoses:  Pain, dental  Closed fracture of tooth, initial encounter  Chronic left shoulder pain     ED Discharge Orders  Ordered    amoxicillin (AMOXIL) 500 MG capsule  3 times daily     07/08/18 2357    oxyCODONE-acetaminophen (PERCOCET/ROXICET) 5-325 MG tablet  Every 4 hours PRN     07/08/18 2357           Note:  This document was prepared using Dragon voice recognition software and may include unintentional dictation errors.    Paulette Blanch, MD 07/09/18 [phone]

## 2018-07-08 NOTE — ED Triage Notes (Signed)
Pt in with co toothache and left shoulder pain. Pt states he knows he has a rotator cuff tear.

## 2018-07-19 ENCOUNTER — Other Ambulatory Visit: Payer: Self-pay | Admitting: Orthopedic Surgery

## 2018-07-19 DIAGNOSIS — M25512 Pain in left shoulder: Secondary | ICD-10-CM

## 2018-07-23 ENCOUNTER — Ambulatory Visit
Admission: RE | Admit: 2018-07-23 | Discharge: 2018-07-23 | Disposition: A | Discharge status: Home / Self Care | Payer: Self-pay | Source: Ambulatory Visit | Attending: Orthopedic Surgery | Admitting: Orthopedic Surgery

## 2018-07-23 DIAGNOSIS — S46012A Strain of muscle(s) and tendon(s) of the rotator cuff of left shoulder, initial encounter: Secondary | ICD-10-CM | POA: Insufficient documentation

## 2018-07-23 DIAGNOSIS — M7989 Other specified soft tissue disorders: Secondary | ICD-10-CM | POA: Insufficient documentation

## 2018-07-23 DIAGNOSIS — M25512 Pain in left shoulder: Secondary | ICD-10-CM

## 2018-07-23 DIAGNOSIS — M12812 Other specific arthropathies, not elsewhere classified, left shoulder: Secondary | ICD-10-CM | POA: Insufficient documentation

## 2019-03-30 ENCOUNTER — Emergency Department (HOSPITAL_COMMUNITY): Payer: No Typology Code available for payment source

## 2019-03-30 ENCOUNTER — Emergency Department (HOSPITAL_COMMUNITY)
Admission: EM | Admit: 2019-03-30 | Discharge: 2019-03-30 | Disposition: A | Discharge status: Home / Self Care | Payer: No Typology Code available for payment source | Attending: Emergency Medicine | Admitting: Emergency Medicine

## 2019-03-30 ENCOUNTER — Other Ambulatory Visit: Payer: Self-pay

## 2019-03-30 ENCOUNTER — Encounter (HOSPITAL_COMMUNITY): Payer: Self-pay | Admitting: Emergency Medicine

## 2019-03-30 DIAGNOSIS — Z86718 Personal history of other venous thrombosis and embolism: Secondary | ICD-10-CM | POA: Insufficient documentation

## 2019-03-30 DIAGNOSIS — F17210 Nicotine dependence, cigarettes, uncomplicated: Secondary | ICD-10-CM | POA: Diagnosis not present

## 2019-03-30 DIAGNOSIS — M25562 Pain in left knee: Secondary | ICD-10-CM | POA: Insufficient documentation

## 2019-03-30 DIAGNOSIS — M25512 Pain in left shoulder: Secondary | ICD-10-CM | POA: Diagnosis not present

## 2019-03-30 DIAGNOSIS — F250 Schizoaffective disorder, bipolar type: Secondary | ICD-10-CM | POA: Diagnosis not present

## 2019-03-30 MED ORDER — IBUPROFEN 800 MG PO TABS
800.0000 mg | ORAL_TABLET | Freq: Once | ORAL | Status: AC
  Administered 2019-03-30: 21:00:00 800 mg via ORAL
  Filled 2019-03-30: qty 1

## 2019-03-30 MED ORDER — NAPROXEN 375 MG PO TABS: 375.0000 mg | ORAL_TABLET | Freq: Two times a day (BID) | ORAL | 0 refills | Status: DC

## 2019-03-30 NOTE — Progress Notes (Signed)
Orthopedic Tech Progress Note Patient Details:  Douglas Sloan 25-Jan-1981 [id number]  Ortho Devices Type of Ortho Device: Arm sling, Knee Sleeve Ortho Device/Splint Location: lle and lue Ortho Device/Splint Interventions: Ordered, Application, Adjustment   Post Interventions Patient Tolerated: Well Instructions Provided: Care of device, Adjustment of device   Karolee Stamps 03/30/2019, 9:07 PM
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
## 2019-03-30 NOTE — ED Notes (Signed)
Ortho tech paged  

## 2019-03-30 NOTE — ED Notes (Signed)
Patient transported to X-ray 
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
## 2019-05-10 ENCOUNTER — Other Ambulatory Visit: Payer: Self-pay

## 2019-05-10 ENCOUNTER — Emergency Department (HOSPITAL_COMMUNITY): Payer: Self-pay

## 2019-05-10 ENCOUNTER — Emergency Department (HOSPITAL_COMMUNITY)
Admission: EM | Admit: 2019-05-10 | Discharge: 2019-05-10 | Disposition: A | Discharge status: Home / Self Care | Payer: Self-pay | Attending: Emergency Medicine | Admitting: Emergency Medicine

## 2019-05-10 DIAGNOSIS — F17210 Nicotine dependence, cigarettes, uncomplicated: Secondary | ICD-10-CM | POA: Insufficient documentation

## 2019-05-10 DIAGNOSIS — J45909 Unspecified asthma, uncomplicated: Secondary | ICD-10-CM | POA: Insufficient documentation

## 2019-05-10 DIAGNOSIS — Z91040 Latex allergy status: Secondary | ICD-10-CM | POA: Insufficient documentation

## 2019-05-10 DIAGNOSIS — M20011 Mallet finger of right finger(s): Secondary | ICD-10-CM | POA: Insufficient documentation

## 2019-05-10 MED ORDER — OXYCODONE-ACETAMINOPHEN 5-325 MG PO TABS
1.0000 | ORAL_TABLET | Freq: Once | ORAL | Status: AC
  Administered 2019-05-10: 17:00:00 1 via ORAL
  Filled 2019-05-10: qty 1

## 2019-05-10 MED ORDER — OXYCODONE-ACETAMINOPHEN 5-325 MG PO TABS: 1.0000 | ORAL_TABLET | Freq: Four times a day (QID) | ORAL | 0 refills | Status: DC | PRN
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
## 2019-05-10 NOTE — Discharge Instructions (Signed)
Please read attached information. If you experience any new or worsening signs or symptoms please return to the emergency room for evaluation. Please follow-up with your primary care provider or specialist as discussed. Please use medication prescribed only as directed and discontinue taking if you have any concerning signs or symptoms.   °

## 2019-05-10 NOTE — ED Provider Notes (Signed)
[facility] Provider Note   CSN: [id number] Arrival date & time: 05/10/19  1534    History   Chief Complaint Chief Complaint  Patient presents with  . Hand Pain    HPI CLIFFARD HAIR is a 38 y.o. male.     HPI   38 year old male presents today with complaints of right finger injury.  Patient notes shortly prior to arrival he slammed his right finger in a door.  He notes inability to extend at the DIP.  Denies any open wounds loss of sensation.  No other injuries.  He is right-hand dominant.  No meds prior to arrival.   Past Medical History:  Diagnosis Date  . Asthma    denies meds  . Bipolar disorder ([place])    denies taking any meds  . Chronic left shoulder pain   . DVT (deep venous thrombosis) ([place])    denies taking xarelto x1 month.  states they plan to put me on some after surgery.  . Schizophrenia [facility])    denies taking any meds    Patient Active Problem List   Diagnosis Date Noted  . Cannabis use disorder, severe, dependence ([place]) 07/14/2017  . Substance induced mood disorder ([place]) 07/14/2017  . Marijuana abuse, continuous 07/13/2017  . Right shoulder pain 05/28/2017  . Right elbow pain 05/28/2017  . Left shoulder pain 04/28/2017  . S/P ACL reconstruction 08/03/2014  . Unspecified vitamin D deficiency 05/02/2014  . Left leg DVT ([place]) 04/03/2014  . Left fibular fracture 04/03/2014  . Unspecified asthma(493.90) 04/03/2014  . Smoking 04/03/2014  . Leg pain 04/03/2014    Past Surgical History:  Procedure Laterality Date  . ANTERIOR CRUCIATE LIGAMENT REPAIR Left 08/03/2014   Procedure: LEFT ALLOGRAFT ACL RECONSTRUCTION/;  Surgeon: Sydnee Cabal, MD;  Location: [facility];  Service: Orthopedics;  Laterality: Left;  . APPENDECTOMY    . FACIAL COSMETIC SURGERY     reconstructive  . KNEE ARTHROSCOPY Left 08/03/2014   Procedure: LEFT ARTHROSCOPY KNEE WITH DEBRIDEMENT;  Surgeon: Sydnee Cabal, MD;  Location:  [facility];  Service: Orthopedics;  Laterality: Left;        Home Medications    Prior to Admission medications   Medication Sig Start Date End Date Taking? Authorizing Provider  albuterol (PROVENTIL HFA;VENTOLIN HFA) 108 (90 Base) MCG/ACT inhaler Inhale 1-2 puffs into the lungs every 6 (six) hours as needed for wheezing or shortness of breath. 10/22/17   Petrucelli, Samantha R, PA-C  amoxicillin (AMOXIL) 500 MG capsule Take 1 capsule (500 mg total) by mouth 3 (three) times daily. 07/08/18   Paulette Blanch, MD  ibuprofen (ADVIL,MOTRIN) 800 MG tablet Take 1 tablet (800 mg total) by mouth 3 (three) times daily. 10/22/17   Petrucelli, Samantha R, PA-C  naproxen (NAPROSYN) 375 MG tablet Take 1 tablet (375 mg total) by mouth 2 (two) times daily. 03/30/19   Etta Quill, NP  oxyCODONE-acetaminophen (PERCOCET/ROXICET) 5-325 MG tablet Take 1 tablet by mouth every 6 (six) hours as needed. 05/10/19   Okey Regal, PA-C    Family History Family History  Problem Relation Age of Onset  . Diabetes Other   . Heart failure Other   . Diabetes Paternal Uncle     Social History Social History   Tobacco Use  . Smoking status: Current Every Day Smoker    Packs/day: 0.50    Years: 10.00    Pack years: 5.00    Types: Cigarettes  . Smokeless tobacco:  Never Used  Substance Use Topics  . Alcohol use: Yes  . Drug use: No     Allergies   Latex   Review of Systems Review of Systems  All other systems reviewed and are negative.  Physical Exam Updated Vital Signs BP (!) 146/75 (BP Location: Left Arm)   Pulse (!) 109   Temp 98.6 F (37 C) (Oral)   Resp 16   Ht 5\' 11"  (1.803 m)   Wt 102.1 kg   SpO2 100%   BMI 31.38 kg/m   Physical Exam Vitals signs and nursing note reviewed.  Constitutional:      Appearance: He is well-developed.  HENT:     Head: Normocephalic and atraumatic.  Eyes:     General: No scleral icterus.       Right eye: No discharge.        Left eye: No  discharge.     Conjunctiva/sclera: Conjunctivae normal.     Pupils: Pupils are equal, round, and reactive to light.  Neck:     Musculoskeletal: Normal range of motion.     Vascular: No JVD.     Trachea: No tracheal deviation.  Pulmonary:     Effort: Pulmonary effort is normal.     Breath sounds: No stridor.  Musculoskeletal:     Comments: Right DIP in flexion with inability to extend the DIP, manual extension without difficulty, no open wounds, sensation intact, cap refill less than 3 seconds, remainder of hand nontender to palpation  Neurological:     Mental Status: He is alert and oriented to person, place, and time.     Coordination: Coordination normal.  Psychiatric:        Behavior: Behavior normal.        Thought Content: Thought content normal.        Judgment: Judgment normal.      ED Treatments / Results  Labs (all labs ordered are listed, but only abnormal results are displayed) Labs Reviewed - No data to display  EKG None  Radiology Dg Hand Complete Right  Result Date: 05/10/2019 CLINICAL DATA:  Closed pain he in car door today with pain and flexion, initial encounter EXAM: RIGHT HAND - COMPLETE 3+ VIEW COMPARISON:  04/17/2018 FINDINGS: Flexion at the fifth DIP joint is noted. No dislocation is noted. A tiny bony density is noted along the distal aspect of the fifth middle phalanx with a small defect at the base of the distal phalanx consistent with avulsion. IMPRESSION: Avulsion from the base of the fifth distal phalanx likely related to the extensor tendon and subsequent flexion. Electronically Signed   By: Inez Catalina M.D.   On: 05/10/2019 16:20    Procedures Procedures (including critical care time)  Medications Ordered in ED Medications  oxyCODONE-acetaminophen (PERCOCET/ROXICET) 5-325 MG per tablet 1 tablet (1 tablet Oral Given 05/10/19 1729)     Initial Impression / Assessment and Plan / ED Course  I have reviewed the triage vital signs and the nursing  notes.  Pertinent labs & imaging results that were available during my care of the patient were reviewed by me and considered in my medical decision making (see chart for details).        38 year old male presents today with rupture of distal extensor tendon.  This is closed no wounds.  Splinted in extension with outpatient follow-up information given.  Return precautions given.  Verbalized understanding and agreement to today's plan.  Final Clinical Impressions(s) / ED Diagnoses   Final diagnoses:  Mallet finger of right hand    ED Discharge Orders         Ordered    oxyCODONE-acetaminophen (PERCOCET/ROXICET) 5-325 MG tablet  Every 6 hours PRN     05/10/19 1756           Okey Regal, PA-C 05/10/19 1759    Carmin Muskrat, MD 05/10/19 1956

## 2020-01-01 ENCOUNTER — Encounter (HOSPITAL_BASED_OUTPATIENT_CLINIC_OR_DEPARTMENT_OTHER): Payer: Self-pay

## 2020-01-01 ENCOUNTER — Emergency Department (HOSPITAL_BASED_OUTPATIENT_CLINIC_OR_DEPARTMENT_OTHER): Payer: Self-pay

## 2020-01-01 ENCOUNTER — Emergency Department (HOSPITAL_BASED_OUTPATIENT_CLINIC_OR_DEPARTMENT_OTHER)
Admission: EM | Admit: 2020-01-01 | Discharge: 2020-01-01 | Disposition: A | Discharge status: Home / Self Care | Payer: Self-pay | Attending: Emergency Medicine | Admitting: Emergency Medicine

## 2020-01-01 ENCOUNTER — Other Ambulatory Visit: Payer: Self-pay

## 2020-01-01 DIAGNOSIS — R05 Cough: Secondary | ICD-10-CM

## 2020-01-01 DIAGNOSIS — R059 Cough, unspecified: Secondary | ICD-10-CM

## 2020-01-01 DIAGNOSIS — Z20822 Contact with and (suspected) exposure to covid-19: Secondary | ICD-10-CM | POA: Insufficient documentation

## 2020-01-01 DIAGNOSIS — J40 Bronchitis, not specified as acute or chronic: Secondary | ICD-10-CM | POA: Insufficient documentation

## 2020-01-01 DIAGNOSIS — J45909 Unspecified asthma, uncomplicated: Secondary | ICD-10-CM | POA: Insufficient documentation

## 2020-01-01 DIAGNOSIS — Z86718 Personal history of other venous thrombosis and embolism: Secondary | ICD-10-CM | POA: Insufficient documentation

## 2020-01-01 DIAGNOSIS — Z91040 Latex allergy status: Secondary | ICD-10-CM | POA: Insufficient documentation

## 2020-01-01 DIAGNOSIS — J069 Acute upper respiratory infection, unspecified: Secondary | ICD-10-CM | POA: Insufficient documentation

## 2020-01-01 LAB — SARS CORONAVIRUS 2 (TAT 6-24 HRS): SARS Coronavirus 2: NEGATIVE

## 2020-01-01 LAB — GROUP A STREP BY PCR: Group A Strep by PCR: NOT DETECTED

## 2020-01-01 MED ORDER — ALBUTEROL SULFATE HFA 108 (90 BASE) MCG/ACT IN AERS
2.0000 | INHALATION_SPRAY | RESPIRATORY_TRACT | Status: DC | PRN
  Administered 2020-01-01: 13:00:00 2 via RESPIRATORY_TRACT
  Filled 2020-01-01: qty 6.7

## 2020-01-01 MED ORDER — ACETAMINOPHEN 500 MG PO TABS
1000.0000 mg | ORAL_TABLET | Freq: Once | ORAL | Status: AC
  Administered 2020-01-01: 1000 mg via ORAL
  Filled 2020-01-01: qty 2

## 2020-01-01 MED ORDER — AEROCHAMBER PLUS FLO-VU MEDIUM MISC
1.0000 | Freq: Once | Status: AC
  Administered 2020-01-01: 13:00:00 1
  Filled 2020-01-01: qty 1

## 2020-01-01 NOTE — ED Triage Notes (Signed)
Pt reports history of bronchitis states he has lost his inhaler and had some SOB with productive cough with [place] mucus, states he has also had a runny nose.

## 2020-01-01 NOTE — ED Notes (Signed)
Upon entering room to discharge patient he states that we should have done more for him and started ripping up discharge paperwork. Offered multiple times to get ED provider, pt declined and walked out of room.

## 2020-01-01 NOTE — ED Provider Notes (Signed)
[facility] EMERGENCY DEPARTMENT Provider Note   CSN: [id number] Arrival date & time: 01/01/20  1046     History Chief Complaint  Patient presents with  . Cough    Douglas Sloan is a 39 y.o. male.  HPI Patient presents to the emergency department with what he feels like is an exacerbation of his chronic bronchitis and asthma.  The patient states that he has been feeling heaviness of breathing over the last week.  The patient states he is also had some mild cough and sore throat.  Patient states that nothing seems to make the condition better but activity seems to make the breathing worse.  Patient states that he lost his inhaler.  The patient denies chest pain, shortness of breath, headache,blurred vision, neck pain, fever, weakness, numbness, dizziness, anorexia, edema, abdominal pain, nausea, vomiting, diarrhea, rash, back pain, dysuria, hematemesis, bloody stool, near syncope, or syncope.  Past Medical History:  Diagnosis Date  . Asthma    denies meds  . Bipolar disorder ([place])    denies taking any meds  . Chronic left shoulder pain   . DVT (deep venous thrombosis) ([place])    denies taking xarelto x1 month.  states they plan to put me on some after surgery.  . Schizophrenia [facility])    denies taking any meds    Patient Active Problem List   Diagnosis Date Noted  . Cannabis use disorder, severe, dependence ([place]) 07/14/2017  . Substance induced mood disorder ([place]) 07/14/2017  . Marijuana abuse, continuous 07/13/2017  . Right shoulder pain 05/28/2017  . Right elbow pain 05/28/2017  . Left shoulder pain 04/28/2017  . S/P ACL reconstruction 08/03/2014  . Unspecified vitamin D deficiency 05/02/2014  . Left leg DVT ([place]) 04/03/2014  . Left fibular fracture 04/03/2014  . Unspecified asthma(493.90) 04/03/2014  . Smoking 04/03/2014  . Leg pain 04/03/2014    Past Surgical History:  Procedure Laterality Date  . ANTERIOR CRUCIATE LIGAMENT REPAIR Left 08/03/2014   Procedure: LEFT ALLOGRAFT ACL RECONSTRUCTION/;  Surgeon: Sydnee Cabal, MD;  Location: [facility];  Service: Orthopedics;  Laterality: Left;  . APPENDECTOMY    . FACIAL COSMETIC SURGERY     reconstructive  . KNEE ARTHROSCOPY Left 08/03/2014   Procedure: LEFT ARTHROSCOPY KNEE WITH DEBRIDEMENT;  Surgeon: Sydnee Cabal, MD;  Location: [facility];  Service: Orthopedics;  Laterality: Left;       Family History  Problem Relation Age of Onset  . Diabetes Other   . Heart failure Other   . Diabetes Paternal Uncle     Social History   Tobacco Use  . Smoking status: Current Every Day Smoker    Packs/day: 0.50    Years: 10.00    Pack years: 5.00    Types: Cigarettes  . Smokeless tobacco: Never Used  Substance Use Topics  . Alcohol use: Yes  . Drug use: No    Home Medications Prior to Admission medications   Medication Sig Start Date End Date Taking? Authorizing Provider  albuterol (PROVENTIL HFA;VENTOLIN HFA) 108 (90 Base) MCG/ACT inhaler Inhale 1-2 puffs into the lungs every 6 (six) hours as needed for wheezing or shortness of breath. 10/22/17   Petrucelli, Samantha R, PA-C  amoxicillin (AMOXIL) 500 MG capsule Take 1 capsule (500 mg total) by mouth 3 (three) times daily. 07/08/18   Paulette Blanch, MD  ibuprofen (ADVIL,MOTRIN) 800 MG tablet Take 1 tablet (800 mg total) by mouth 3 (three) times daily. 10/22/17   Petrucelli,  Samantha R, PA-C  naproxen (NAPROSYN) 375 MG tablet Take 1 tablet (375 mg total) by mouth 2 (two) times daily. 03/30/19   Etta Quill, NP  oxyCODONE-acetaminophen (PERCOCET/ROXICET) 5-325 MG tablet Take 1 tablet by mouth every 6 (six) hours as needed. 05/10/19   Hedges, Dellis Filbert, PA-C    Allergies    Latex  Review of Systems   Review of Systems All other systems negative except as documented in the HPI. All pertinent positives and negatives as reviewed in the HPI. Physical Exam Updated Vital Signs BP 132/80 (BP Location: Left Arm)    Pulse 67   Temp 98.4 F (36.9 C) (Oral)   Resp 14   Ht 5\' 11"  (1.803 m)   Wt 104.3 kg   SpO2 100%   BMI 32.08 kg/m   Physical Exam Vitals and nursing note reviewed.  Constitutional:      General: He is not in acute distress.    Appearance: He is well-developed.  HENT:     Head: Normocephalic and atraumatic.  Eyes:     Pupils: Pupils are equal, round, and reactive to light.  Cardiovascular:     Rate and Rhythm: Normal rate and regular rhythm.     Heart sounds: Normal heart sounds. No murmur. No friction rub. No gallop.   Pulmonary:     Effort: Pulmonary effort is normal. No respiratory distress.     Breath sounds: Normal breath sounds. No wheezing.  Abdominal:     General: Bowel sounds are normal. There is no distension.     Palpations: Abdomen is soft.     Tenderness: There is no abdominal tenderness.  Musculoskeletal:     Cervical back: Normal range of motion and neck supple.  Skin:    General: Skin is warm and dry.     Capillary Refill: Capillary refill takes less than 2 seconds.     Findings: No erythema or rash.  Neurological:     Mental Status: He is alert and oriented to person, place, and time.     Motor: No abnormal muscle tone.     Coordination: Coordination normal.  Psychiatric:        Behavior: Behavior normal.     ED Results / Procedures / Treatments   Labs (all labs ordered are listed, but only abnormal results are displayed) Labs Reviewed  GROUP A STREP BY PCR  SARS CORONAVIRUS 2 (TAT 6-24 HRS)    EKG None  Radiology DG Chest Port 1 View  Result Date: 01/01/2020 CLINICAL DATA:  Productive cough and shortness of breath. EXAM: PORTABLE CHEST 1 VIEW COMPARISON:  06/21/2028 FINDINGS: The heart size and mediastinal contours are within normal limits. Both lungs are clear. The visualized skeletal structures are unremarkable. IMPRESSION: No active disease. Electronically Signed   By: Marlaine Hind M.D.   On: 01/01/2020 12:28    Procedures Procedures  (including critical care time)  Medications Ordered in ED Medications  albuterol (VENTOLIN HFA) 108 (90 Base) MCG/ACT inhaler 2 puff (2 puffs Inhalation Given 01/01/20 1238)  acetaminophen (TYLENOL) tablet 1,000 mg (1,000 mg Oral Given 01/01/20 1158)  AeroChamber Plus Flo-Vu Medium MISC 1 each (1 each Other Given 01/01/20 1238)    ED Course  I have reviewed the triage vital signs and the nursing notes.  Pertinent labs & imaging results that were available during my care of the patient were reviewed by me and considered in my medical decision making (see chart for details).    MDM Rules/Calculators/A&P  Patient will be tested for Covid to further assess this is a possibility of his symptoms.  The patient be given an inhaler for home.  Told to return here as needed.  Told to increase his fluid intake and rest as much as possible. Final Clinical Impression(s) / ED Diagnoses Final diagnoses:  None    Rx / DC Orders ED Discharge Orders    None       Dalia Heading, PA-C 01/01/20 [place], Nathan, MD 01/01/20 1431

## 2020-01-01 NOTE — Discharge Instructions (Addendum)
Return here as needed.  Follow-up with a primary care doctor.  Increase your fluid intake.  Your Covid test should result in the next 24 hours.

## 2020-06-09 ENCOUNTER — Encounter (HOSPITAL_COMMUNITY): Payer: Self-pay | Admitting: Emergency Medicine

## 2020-06-09 ENCOUNTER — Emergency Department (HOSPITAL_COMMUNITY)
Admission: EM | Admit: 2020-06-09 | Discharge: 2020-06-09 | Disposition: A | Discharge status: Home / Self Care | Payer: BLUE CROSS/BLUE SHIELD | Attending: Emergency Medicine | Admitting: Emergency Medicine

## 2020-06-09 ENCOUNTER — Other Ambulatory Visit: Payer: Self-pay

## 2020-06-09 ENCOUNTER — Emergency Department (HOSPITAL_COMMUNITY): Payer: BLUE CROSS/BLUE SHIELD

## 2020-06-09 DIAGNOSIS — Z5321 Procedure and treatment not carried out due to patient leaving prior to being seen by health care provider: Secondary | ICD-10-CM | POA: Diagnosis not present

## 2020-06-09 DIAGNOSIS — R079 Chest pain, unspecified: Secondary | ICD-10-CM | POA: Diagnosis present

## 2020-06-09 LAB — BASIC METABOLIC PANEL
Anion gap: 11 (ref 5–15)
BUN: 10 mg/dL (ref 6–20)
CO2: 22 mmol/L (ref 22–32)
Calcium: 9.4 mg/dL (ref 8.9–10.3)
Chloride: 107 mmol/L (ref 98–111)
Creatinine, Ser: 0.9 mg/dL (ref 0.61–1.24)
GFR calc Af Amer: >60 mL/min (ref >60)
GFR calc non Af Amer: >60 mL/min (ref >60)
Glucose, Bld: 93 mg/dL (ref 70–99)
Potassium: 3.6 mmol/L (ref 3.5–5.1)
Sodium: 140 mmol/L (ref 135–145)

## 2020-06-09 LAB — CBC
HCT: 45.7 % (ref 39.0–52.0)
Hemoglobin: 15.2 g/dL (ref 13.0–17.0)
MCH: 29.9 pg (ref 26.0–34.0)
MCHC: 33.3 g/dL (ref 30.0–36.0)
MCV: 90 fL (ref 80.0–100.0)
Platelets: 316 10*3/uL (ref 150–400)
RBC: 5.08 MIL/uL (ref 4.22–5.81)
RDW: 13.2 % (ref 11.5–15.5)
WBC: 11.7 10*3/uL — ABNORMAL HIGH (ref 4.0–10.5)
nRBC: 0 % (ref 0.0–0.2)

## 2020-06-09 LAB — TROPONIN I (HIGH SENSITIVITY): Troponin I (High Sensitivity): 6 ng/L (ref <18)

## 2020-06-09 NOTE — ED Triage Notes (Signed)
Pt to triage via [facility].  Reports using ecstasy last night.  Got up this morning and was upset and went for a 2-3 mile walk.  States when he got back he laid down and was wheezing and remembers EMS being there next.  States he feels like he is dehydrated.  EMS reports pt was lethargic on their arrival.  EMS administered NS 300cc.  Pt reports intermittent chest pain.  No chest pain at present.  Reports L testicle pain.  States he is scheduled to see oncologist for testicular cancer.  Pt talking on phone.  Alert and oriented.

## 2020-06-09 NOTE — ED Notes (Signed)
Pt called for vitals x3 with no response.  

## 2020-06-09 NOTE — ED Notes (Signed)
Pt called for vitals x2

## 2020-06-09 NOTE — ED Notes (Signed)
Pt called for vitals x1

## 2020-06-17 ENCOUNTER — Emergency Department (HOSPITAL_COMMUNITY): Payer: BLUE CROSS/BLUE SHIELD

## 2020-06-17 ENCOUNTER — Emergency Department (HOSPITAL_COMMUNITY)
Admission: EM | Admit: 2020-06-17 | Discharge: 2020-06-18 | Disposition: A | Discharge status: Home / Self Care | Payer: BLUE CROSS/BLUE SHIELD | Attending: Emergency Medicine | Admitting: Emergency Medicine

## 2020-06-17 DIAGNOSIS — Z91040 Latex allergy status: Secondary | ICD-10-CM | POA: Diagnosis not present

## 2020-06-17 DIAGNOSIS — Z79899 Other long term (current) drug therapy: Secondary | ICD-10-CM | POA: Insufficient documentation

## 2020-06-17 DIAGNOSIS — Y999 Unspecified external cause status: Secondary | ICD-10-CM | POA: Insufficient documentation

## 2020-06-17 DIAGNOSIS — S21109A Unspecified open wound of unspecified front wall of thorax without penetration into thoracic cavity, initial encounter: Secondary | ICD-10-CM | POA: Diagnosis not present

## 2020-06-17 DIAGNOSIS — Y939 Activity, unspecified: Secondary | ICD-10-CM | POA: Insufficient documentation

## 2020-06-17 DIAGNOSIS — J45909 Unspecified asthma, uncomplicated: Secondary | ICD-10-CM | POA: Insufficient documentation

## 2020-06-17 DIAGNOSIS — M545 Low back pain: Secondary | ICD-10-CM | POA: Insufficient documentation

## 2020-06-17 DIAGNOSIS — W3400XA Accidental discharge from unspecified firearms or gun, initial encounter: Secondary | ICD-10-CM | POA: Insufficient documentation

## 2020-06-17 DIAGNOSIS — F17210 Nicotine dependence, cigarettes, uncomplicated: Secondary | ICD-10-CM | POA: Diagnosis not present

## 2020-06-17 DIAGNOSIS — Y929 Unspecified place or not applicable: Secondary | ICD-10-CM | POA: Diagnosis not present

## 2020-06-17 DIAGNOSIS — Z20822 Contact with and (suspected) exposure to covid-19: Secondary | ICD-10-CM | POA: Diagnosis not present

## 2020-06-17 LAB — COMPREHENSIVE METABOLIC PANEL
ALT: 37 U/L (ref 0–44)
AST: 24 U/L (ref 15–41)
Albumin: 3.8 g/dL (ref 3.5–5.0)
Alkaline Phosphatase: 59 U/L (ref 38–126)
Anion gap: 9 (ref 5–15)
BUN: 10 mg/dL (ref 6–20)
CO2: 23 mmol/L (ref 22–32)
Calcium: 9.1 mg/dL (ref 8.9–10.3)
Chloride: 107 mmol/L (ref 98–111)
Creatinine, Ser: 1.03 mg/dL (ref 0.61–1.24)
GFR calc Af Amer: >60 mL/min (ref >60)
GFR calc non Af Amer: >60 mL/min (ref >60)
Glucose, Bld: 109 mg/dL — ABNORMAL HIGH (ref 70–99)
Potassium: 3.6 mmol/L (ref 3.5–5.1)
Sodium: 139 mmol/L (ref 135–145)
Total Bilirubin: 0.6 mg/dL (ref 0.3–1.2)
Total Protein: 6.8 g/dL (ref 6.5–8.1)

## 2020-06-17 LAB — CBC
HCT: 44.6 % (ref 39.0–52.0)
Hemoglobin: 14.4 g/dL (ref 13.0–17.0)
MCH: 29.4 pg (ref 26.0–34.0)
MCHC: 32.3 g/dL (ref 30.0–36.0)
MCV: 91 fL (ref 80.0–100.0)
Platelets: 344 10*3/uL (ref 150–400)
RBC: 4.9 MIL/uL (ref 4.22–5.81)
RDW: 13.1 % (ref 11.5–15.5)
WBC: 11.1 10*3/uL — ABNORMAL HIGH (ref 4.0–10.5)
nRBC: 0 % (ref 0.0–0.2)

## 2020-06-17 LAB — SAMPLE TO BLOOD BANK

## 2020-06-17 LAB — PROTIME-INR
INR: 1 (ref 0.8–1.2)
Prothrombin Time: 12.4 seconds (ref 11.4–15.2)

## 2020-06-17 LAB — LACTIC ACID, PLASMA: Lactic Acid, Venous: 2.3 mmol/L (ref 0.5–1.9)

## 2020-06-17 LAB — SARS CORONAVIRUS 2 BY RT PCR (HOSPITAL ORDER, PERFORMED IN ~~LOC~~ HOSPITAL LAB): SARS Coronavirus 2: NEGATIVE

## 2020-06-17 LAB — ETHANOL: Alcohol, Ethyl (B): <10 mg/dL (ref <10)

## 2020-06-17 MED ORDER — LACTATED RINGERS IV BOLUS
1000.0000 mL | Freq: Once | INTRAVENOUS | Status: AC
  Administered 2020-06-17: 1000 mL via INTRAVENOUS

## 2020-06-17 MED ORDER — IOHEXOL 300 MG/ML  SOLN
100.0000 mL | Freq: Once | INTRAMUSCULAR | Status: AC | PRN
  Administered 2020-06-17: 100 mL via INTRAVENOUS

## 2020-06-17 MED ORDER — MORPHINE SULFATE (PF) 4 MG/ML IV SOLN
4.0000 mg | Freq: Once | INTRAVENOUS | Status: AC
  Administered 2020-06-17: 4 mg via INTRAVENOUS
  Filled 2020-06-17: qty 1

## 2020-06-17 MED ORDER — OXYCODONE-ACETAMINOPHEN 5-325 MG PO TABS: 1.0000 | ORAL_TABLET | Freq: Four times a day (QID) | ORAL | 0 refills | Status: DC | PRN
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
## 2020-06-17 NOTE — ED Notes (Signed)
Pt in CT with Dr. Dema Severin
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
## 2020-06-17 NOTE — Discharge Instructions (Addendum)
You can take 600 mg of ibuprofen every 6 hours, you can take 650 mg of Tylenol every 6 hours, you can alternate these every 3 or you can take them together.  You can take the additional pain medication I gave you as prescribed, follow-up with your primary care provider for further wound care and pain control
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
## 2020-06-18 NOTE — ED Notes (Signed)
Pt stood and ambulated without assistance. Steady gait noted.

## 2020-06-19 ENCOUNTER — Emergency Department (HOSPITAL_COMMUNITY): Payer: BLUE CROSS/BLUE SHIELD

## 2020-06-19 ENCOUNTER — Emergency Department (HOSPITAL_COMMUNITY)
Admission: EM | Admit: 2020-06-19 | Discharge: 2020-06-19 | Disposition: A | Discharge status: Home / Self Care | Payer: BLUE CROSS/BLUE SHIELD | Attending: Emergency Medicine | Admitting: Emergency Medicine

## 2020-06-19 DIAGNOSIS — S31000A Unspecified open wound of lower back and pelvis without penetration into retroperitoneum, initial encounter: Secondary | ICD-10-CM | POA: Insufficient documentation

## 2020-06-19 DIAGNOSIS — F17210 Nicotine dependence, cigarettes, uncomplicated: Secondary | ICD-10-CM | POA: Diagnosis not present

## 2020-06-19 DIAGNOSIS — Y939 Activity, unspecified: Secondary | ICD-10-CM | POA: Diagnosis not present

## 2020-06-19 DIAGNOSIS — Y999 Unspecified external cause status: Secondary | ICD-10-CM | POA: Insufficient documentation

## 2020-06-19 DIAGNOSIS — Y929 Unspecified place or not applicable: Secondary | ICD-10-CM | POA: Insufficient documentation

## 2020-06-19 DIAGNOSIS — J45909 Unspecified asthma, uncomplicated: Secondary | ICD-10-CM | POA: Insufficient documentation

## 2020-06-19 DIAGNOSIS — Z8547 Personal history of malignant neoplasm of testis: Secondary | ICD-10-CM | POA: Insufficient documentation

## 2020-06-19 DIAGNOSIS — Z7951 Long term (current) use of inhaled steroids: Secondary | ICD-10-CM | POA: Insufficient documentation

## 2020-06-19 DIAGNOSIS — Z79899 Other long term (current) drug therapy: Secondary | ICD-10-CM | POA: Diagnosis not present

## 2020-06-19 DIAGNOSIS — W3400XA Accidental discharge from unspecified firearms or gun, initial encounter: Secondary | ICD-10-CM | POA: Diagnosis not present

## 2020-06-19 DIAGNOSIS — Z91040 Latex allergy status: Secondary | ICD-10-CM | POA: Diagnosis not present

## 2020-06-19 DIAGNOSIS — R531 Weakness: Secondary | ICD-10-CM | POA: Diagnosis not present

## 2020-06-19 MED ORDER — ALBUTEROL SULFATE HFA 108 (90 BASE) MCG/ACT IN AERS: 2.0000 | INHALATION_SPRAY | Freq: Once | RESPIRATORY_TRACT | Status: DC

## 2020-06-19 MED ORDER — DIAZEPAM 5 MG PO TABS
5.0000 mg | ORAL_TABLET | Freq: Once | ORAL | Status: AC
  Administered 2020-06-19: 5 mg via ORAL
  Filled 2020-06-19: qty 1

## 2020-06-19 MED ORDER — METHOCARBAMOL 500 MG PO TABS: 500.0000 mg | ORAL_TABLET | Freq: Two times a day (BID) | ORAL | 0 refills | Status: DC

## 2020-06-19 MED ORDER — OXYCODONE-ACETAMINOPHEN 5-325 MG PO TABS
1.0000 | ORAL_TABLET | Freq: Once | ORAL | Status: AC
  Administered 2020-06-19: 1 via ORAL
  Filled 2020-06-19: qty 1

## 2020-06-19 MED ORDER — NAPROXEN 375 MG PO TABS: 375.0000 mg | ORAL_TABLET | Freq: Two times a day (BID) | ORAL | 0 refills | Status: DC

## 2020-06-19 MED ORDER — NAPROXEN 500 MG PO TABS
500.0000 mg | ORAL_TABLET | Freq: Once | ORAL | Status: AC
  Administered 2020-06-19: 500 mg via ORAL
  Filled 2020-06-19: qty 1
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
## 2020-07-05 ENCOUNTER — Ambulatory Visit: Payer: Self-pay | Admitting: Surgery
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
## 2020-08-10 ENCOUNTER — Encounter (HOSPITAL_BASED_OUTPATIENT_CLINIC_OR_DEPARTMENT_OTHER): Payer: Self-pay | Admitting: Emergency Medicine

## 2020-08-10 ENCOUNTER — Other Ambulatory Visit: Payer: Self-pay

## 2020-08-10 DIAGNOSIS — R079 Chest pain, unspecified: Secondary | ICD-10-CM | POA: Insufficient documentation

## 2020-08-10 DIAGNOSIS — Z5321 Procedure and treatment not carried out due to patient leaving prior to being seen by health care provider: Secondary | ICD-10-CM | POA: Insufficient documentation
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
## 2020-08-11 ENCOUNTER — Ambulatory Visit (HOSPITAL_BASED_OUTPATIENT_CLINIC_OR_DEPARTMENT_OTHER): Admission: RE | Admit: 2020-08-11 | Payer: BLUE CROSS/BLUE SHIELD | Source: Ambulatory Visit

## 2020-08-11 ENCOUNTER — Emergency Department (HOSPITAL_BASED_OUTPATIENT_CLINIC_OR_DEPARTMENT_OTHER)
Admission: EM | Admit: 2020-08-11 | Discharge: 2020-08-11 | Disposition: A | Discharge status: Home / Self Care | Payer: BLUE CROSS/BLUE SHIELD | Attending: Emergency Medicine | Admitting: Emergency Medicine

## 2020-09-03 ENCOUNTER — Encounter (HOSPITAL_BASED_OUTPATIENT_CLINIC_OR_DEPARTMENT_OTHER): Payer: Self-pay | Admitting: Surgery

## 2020-09-03 ENCOUNTER — Other Ambulatory Visit: Payer: Self-pay

## 2020-09-04 ENCOUNTER — Other Ambulatory Visit (HOSPITAL_COMMUNITY): Payer: BLUE CROSS/BLUE SHIELD

## 2020-09-06 ENCOUNTER — Other Ambulatory Visit (HOSPITAL_COMMUNITY)
Admission: RE | Admit: 2020-09-06 | Discharge: 2020-09-06 | Disposition: A | Discharge status: Home / Self Care | Payer: BLUE CROSS/BLUE SHIELD | Source: Ambulatory Visit | Attending: Surgery | Admitting: Surgery

## 2020-09-06 NOTE — Progress Notes (Signed)
Reminded pt to go for covid test today, gave address and times to patient, he verbalized understanding and states is "going now".

## 2020-09-06 NOTE — Progress Notes (Signed)
° ° ° ° °  Enhanced Recovery after Surgery for Orthopedics Enhanced Recovery after Surgery is a protocol used to improve the stress on your body and your recovery after surgery.  Patient Instructions   The night before surgery:  o No food after midnight. ONLY clear liquids after midnight   The day of surgery (if you do NOT have diabetes):  o Drink ONE (1) Pre-Surgery Clear Ensure as directed.   o This drink was given to you during your hospital  pre-op appointment visit. o The pre-op nurse will instruct you on the time to drink the  Pre-Surgery Ensure depending on your surgery time. o Finish the drink at the designated time by the pre-op nurse.  o Nothing else to drink after completing the  Pre-Surgery Clear Ensure.   The day of surgery (if you have diabetes): o Drink ONE (1) Gatorade 2 (G2) as directed. o This drink was given to you during your hospital  pre-op appointment visit.  o The pre-op nurse will instruct you on the time to drink the   Gatorade 2 (G2) depending on your surgery time. o Color of the Gatorade may vary. Red is not allowed. o Nothing else to drink after completing the  Gatorade 2 (G2).         If you have questions, please contact your surgeons office.  Pt given soap and instructions, verbalized understanding.

## 2020-09-07 ENCOUNTER — Ambulatory Visit (HOSPITAL_BASED_OUTPATIENT_CLINIC_OR_DEPARTMENT_OTHER): Payer: BLUE CROSS/BLUE SHIELD | Admitting: Certified Registered"

## 2020-09-07 ENCOUNTER — Ambulatory Visit (HOSPITAL_BASED_OUTPATIENT_CLINIC_OR_DEPARTMENT_OTHER)
Admission: RE | Admit: 2020-09-07 | Discharge: 2020-09-07 | Disposition: A | Discharge status: Home / Self Care | Payer: BLUE CROSS/BLUE SHIELD | Attending: Surgery | Admitting: Surgery

## 2020-09-07 ENCOUNTER — Ambulatory Visit (HOSPITAL_COMMUNITY): Payer: BLUE CROSS/BLUE SHIELD

## 2020-09-07 ENCOUNTER — Encounter (HOSPITAL_BASED_OUTPATIENT_CLINIC_OR_DEPARTMENT_OTHER): Payer: Self-pay | Admitting: Surgery

## 2020-09-07 ENCOUNTER — Other Ambulatory Visit: Payer: Self-pay

## 2020-09-07 ENCOUNTER — Encounter (HOSPITAL_BASED_OUTPATIENT_CLINIC_OR_DEPARTMENT_OTHER)
Admission: RE | Disposition: A | Discharge status: Home / Self Care | Payer: Self-pay | Source: Home / Self Care | Attending: Surgery

## 2020-09-07 DIAGNOSIS — F17210 Nicotine dependence, cigarettes, uncomplicated: Secondary | ICD-10-CM | POA: Diagnosis not present

## 2020-09-07 DIAGNOSIS — F319 Bipolar disorder, unspecified: Secondary | ICD-10-CM | POA: Insufficient documentation

## 2020-09-07 DIAGNOSIS — Z91040 Latex allergy status: Secondary | ICD-10-CM | POA: Insufficient documentation

## 2020-09-07 DIAGNOSIS — F209 Schizophrenia, unspecified: Secondary | ICD-10-CM | POA: Diagnosis not present

## 2020-09-07 DIAGNOSIS — M795 Residual foreign body in soft tissue: Secondary | ICD-10-CM | POA: Insufficient documentation

## 2020-09-07 DIAGNOSIS — Z833 Family history of diabetes mellitus: Secondary | ICD-10-CM | POA: Insufficient documentation

## 2020-09-07 DIAGNOSIS — Z8249 Family history of ischemic heart disease and other diseases of the circulatory system: Secondary | ICD-10-CM | POA: Insufficient documentation

## 2020-09-07 DIAGNOSIS — Z20822 Contact with and (suspected) exposure to covid-19: Secondary | ICD-10-CM | POA: Diagnosis not present

## 2020-09-07 DIAGNOSIS — Z5309 Procedure and treatment not carried out because of other contraindication: Secondary | ICD-10-CM | POA: Diagnosis not present

## 2020-09-07 DIAGNOSIS — Z86718 Personal history of other venous thrombosis and embolism: Secondary | ICD-10-CM | POA: Insufficient documentation

## 2020-09-07 HISTORY — PX: FOREIGN BODY REMOVAL: SHX962

## 2020-09-07 LAB — SARS CORONAVIRUS 2 BY RT PCR (HOSPITAL ORDER, PERFORMED IN ~~LOC~~ HOSPITAL LAB): SARS Coronavirus 2: NEGATIVE

## 2020-09-07 SURGERY — FOREIGN BODY REMOVAL ADULT
Anesthesia: General | Site: Back | Laterality: Left

## 2020-09-07 MED ORDER — HYDROMORPHONE HCL 1 MG/ML IJ SOLN: 0.2500 mg | INTRAMUSCULAR | Status: DC | PRN

## 2020-09-07 MED ORDER — DEXMEDETOMIDINE (PRECEDEX) IN NS 20 MCG/5ML (4 MCG/ML) IV SYRINGE
PREFILLED_SYRINGE | INTRAVENOUS | Status: AC
  Filled 2020-09-07: qty 5

## 2020-09-07 MED ORDER — FENTANYL CITRATE (PF) 100 MCG/2ML IJ SOLN
INTRAMUSCULAR | Status: AC
  Filled 2020-09-07: qty 2

## 2020-09-07 MED ORDER — PROPOFOL 500 MG/50ML IV EMUL
INTRAVENOUS | Status: AC
  Filled 2020-09-07: qty 50

## 2020-09-07 MED ORDER — ENOXAPARIN SODIUM 40 MG/0.4ML ~~LOC~~ SOLN
SUBCUTANEOUS | Status: AC
  Filled 2020-09-07: qty 0.4

## 2020-09-07 MED ORDER — IBUPROFEN 800 MG PO TABS: 800.0000 mg | ORAL_TABLET | Freq: Three times a day (TID) | ORAL | 1 refills | Status: DC | PRN

## 2020-09-07 MED ORDER — CHLORHEXIDINE GLUCONATE CLOTH 2 % EX PADS: 6.0000 | MEDICATED_PAD | Freq: Once | CUTANEOUS | Status: DC

## 2020-09-07 MED ORDER — CEFAZOLIN SODIUM-DEXTROSE 2-4 GM/100ML-% IV SOLN
2.0000 g | INTRAVENOUS | Status: AC
  Administered 2020-09-07: 2 g via INTRAVENOUS

## 2020-09-07 MED ORDER — FENTANYL CITRATE (PF) 100 MCG/2ML IJ SOLN
INTRAMUSCULAR | Status: DC | PRN
  Administered 2020-09-07: 50 ug via INTRAVENOUS
  Administered 2020-09-07: 100 ug via INTRAVENOUS

## 2020-09-07 MED ORDER — LIDOCAINE HCL (CARDIAC) PF 100 MG/5ML IV SOSY
PREFILLED_SYRINGE | INTRAVENOUS | Status: DC | PRN
  Administered 2020-09-07: 100 mg via INTRAVENOUS

## 2020-09-07 MED ORDER — BUPIVACAINE LIPOSOME 1.3 % IJ SUSP
INTRAMUSCULAR | Status: DC | PRN
  Administered 2020-09-07: 20 mL

## 2020-09-07 MED ORDER — LIDOCAINE-EPINEPHRINE (PF) 1 %-1:200000 IJ SOLN
INTRAMUSCULAR | Status: AC
  Filled 2020-09-07: qty 30

## 2020-09-07 MED ORDER — OXYCODONE HCL 5 MG PO TABS: 5.0000 mg | ORAL_TABLET | Freq: Once | ORAL | Status: DC | PRN

## 2020-09-07 MED ORDER — ROCURONIUM BROMIDE 10 MG/ML (PF) SYRINGE
PREFILLED_SYRINGE | INTRAVENOUS | Status: AC
  Filled 2020-09-07: qty 10

## 2020-09-07 MED ORDER — PROPOFOL 500 MG/50ML IV EMUL
INTRAVENOUS | Status: DC | PRN
  Administered 2020-09-07: 25 ug/kg/min via INTRAVENOUS

## 2020-09-07 MED ORDER — LIDOCAINE-EPINEPHRINE (PF) 1 %-1:200000 IJ SOLN
INTRAMUSCULAR | Status: DC | PRN
  Administered 2020-09-07: 20 mL

## 2020-09-07 MED ORDER — DOCUSATE SODIUM 100 MG PO CAPS: 100.0000 mg | ORAL_CAPSULE | Freq: Two times a day (BID) | ORAL | 2 refills | Status: DC

## 2020-09-07 MED ORDER — MIDAZOLAM HCL 5 MG/5ML IJ SOLN
INTRAMUSCULAR | Status: DC | PRN
  Administered 2020-09-07: 2 mg via INTRAVENOUS

## 2020-09-07 MED ORDER — CEFAZOLIN SODIUM-DEXTROSE 2-4 GM/100ML-% IV SOLN
INTRAVENOUS | Status: AC
  Filled 2020-09-07: qty 100

## 2020-09-07 MED ORDER — OXYCODONE HCL 5 MG/5ML PO SOLN: 5.0000 mg | Freq: Once | ORAL | Status: DC | PRN

## 2020-09-07 MED ORDER — PROMETHAZINE HCL 25 MG/ML IJ SOLN: 6.2500 mg | INTRAMUSCULAR | Status: DC | PRN

## 2020-09-07 MED ORDER — BUPIVACAINE LIPOSOME 1.3 % IJ SUSP
INTRAMUSCULAR | Status: AC
  Filled 2020-09-07: qty 20

## 2020-09-07 MED ORDER — LIDOCAINE 2% (20 MG/ML) 5 ML SYRINGE
INTRAMUSCULAR | Status: AC
  Filled 2020-09-07: qty 5

## 2020-09-07 MED ORDER — ONDANSETRON HCL 4 MG/2ML IJ SOLN
INTRAMUSCULAR | Status: DC | PRN
  Administered 2020-09-07: 4 mg via INTRAVENOUS

## 2020-09-07 MED ORDER — OXYCODONE HCL 5 MG PO TABS
5.0000 mg | ORAL_TABLET | Freq: Three times a day (TID) | ORAL | 0 refills | Status: DC | PRN
Start: 2020-09-07 — End: 2020-11-21

## 2020-09-07 MED ORDER — SUGAMMADEX SODIUM 200 MG/2ML IV SOLN
INTRAVENOUS | Status: DC | PRN
  Administered 2020-09-07: 200 mg via INTRAVENOUS

## 2020-09-07 MED ORDER — ENOXAPARIN SODIUM 40 MG/0.4ML ~~LOC~~ SOLN
40.0000 mg | Freq: Once | SUBCUTANEOUS | Status: AC
  Administered 2020-09-07: 40 mg via SUBCUTANEOUS

## 2020-09-07 MED ORDER — LACTATED RINGERS IV SOLN: INTRAVENOUS | Status: DC

## 2020-09-07 MED ORDER — KETAMINE HCL 100 MG/ML IJ SOLN
INTRAMUSCULAR | Status: AC
  Filled 2020-09-07: qty 1

## 2020-09-07 MED ORDER — MIDAZOLAM HCL 2 MG/2ML IJ SOLN
INTRAMUSCULAR | Status: AC
  Filled 2020-09-07: qty 2

## 2020-09-07 MED ORDER — MEPERIDINE HCL 25 MG/ML IJ SOLN: 6.2500 mg | INTRAMUSCULAR | Status: DC | PRN

## 2020-09-07 MED ORDER — PROPOFOL 10 MG/ML IV BOLUS
INTRAVENOUS | Status: DC | PRN
  Administered 2020-09-07: 200 mg via INTRAVENOUS

## 2020-09-07 MED ORDER — ROCURONIUM BROMIDE 100 MG/10ML IV SOLN
INTRAVENOUS | Status: DC | PRN
  Administered 2020-09-07: 40 mg via INTRAVENOUS

## 2020-09-07 SURGICAL SUPPLY — 66 items
ADH SKN CLS APL DERMABOND .7 (GAUZE/BANDAGES/DRESSINGS) ×1
APL PRP STRL LF DISP 70% ISPRP (MISCELLANEOUS)
BLADE CLIPPER SURG (BLADE) IMPLANT
BLADE SURG 10 STRL SS (BLADE) ×3 IMPLANT
BLADE SURG 15 STRL LF DISP TIS (BLADE) ×1 IMPLANT
BLADE SURG 15 STRL SS (BLADE) ×3
CANISTER SUCT 1200ML W/VALVE (MISCELLANEOUS) ×2 IMPLANT
CHLORAPREP W/TINT 26 (MISCELLANEOUS) ×1 IMPLANT
CLOSURE STERI-STRIP 1/2X4 (GAUZE/BANDAGES/DRESSINGS)
CLSR STERI-STRIP ANTIMIC 1/2X4 (GAUZE/BANDAGES/DRESSINGS) ×1 IMPLANT
COVER BACK TABLE 60X90IN (DRAPES) ×3 IMPLANT
COVER MAYO STAND STRL (DRAPES) ×3 IMPLANT
COVER PROBE 5X48 (MISCELLANEOUS) ×3
COVER WAND RF STERILE (DRAPES) IMPLANT
DECANTER SPIKE VIAL GLASS SM (MISCELLANEOUS) IMPLANT
DERMABOND ADVANCED (GAUZE/BANDAGES/DRESSINGS) ×2
DERMABOND ADVANCED .7 DNX12 (GAUZE/BANDAGES/DRESSINGS) ×1 IMPLANT
DRAPE C-ARM 42X72 X-RAY (DRAPES) ×4 IMPLANT
DRAPE LAPAROTOMY 100X72 PEDS (DRAPES) ×3 IMPLANT
DRAPE UTILITY XL STRL (DRAPES) ×3 IMPLANT
DRSG TEGADERM 4X4.75 (GAUZE/BANDAGES/DRESSINGS) IMPLANT
ELECT BLADE 4.0 EZ CLEAN MEGAD (MISCELLANEOUS) ×3
ELECT COATED BLADE 2.86 ST (ELECTRODE) IMPLANT
ELECT REM PT RETURN 9FT ADLT (ELECTROSURGICAL) ×3
ELECTRODE BLDE 4.0 EZ CLN MEGD (MISCELLANEOUS) IMPLANT
ELECTRODE REM PT RTRN 9FT ADLT (ELECTROSURGICAL) ×1 IMPLANT
GAUZE PACKING IODOFORM 1/4X15 (PACKING) IMPLANT
GAUZE SPONGE 4X4 12PLY STRL LF (GAUZE/BANDAGES/DRESSINGS) IMPLANT
GLOVE BIO SURGEON STRL SZ 6.5 (GLOVE) ×1 IMPLANT
GLOVE BIO SURGEONS STRL SZ 6.5 (GLOVE)
GLOVE BIOGEL PI IND STRL 6 (GLOVE) ×1 IMPLANT
GLOVE BIOGEL PI INDICATOR 6 (GLOVE) ×2
GLOVE SURG SS PI 6.5 STRL IVOR (GLOVE) ×6 IMPLANT
GLOVE SURG SS PI 7.5 STRL IVOR (GLOVE) ×2 IMPLANT
GOWN STRL REUS W/ TWL LRG LVL3 (GOWN DISPOSABLE) ×2 IMPLANT
GOWN STRL REUS W/ TWL XL LVL3 (GOWN DISPOSABLE) IMPLANT
GOWN STRL REUS W/TWL LRG LVL3 (GOWN DISPOSABLE) ×6
GOWN STRL REUS W/TWL XL LVL3 (GOWN DISPOSABLE) ×3
KIT CVR 48X5XPRB PLUP LF (MISCELLANEOUS) IMPLANT
KIT MARKER MARGIN INK (KITS) IMPLANT
NDL HYPO 25X1 1.5 SAFETY (NEEDLE) ×1 IMPLANT
NEEDLE HYPO 25X1 1.5 SAFETY (NEEDLE) ×3 IMPLANT
NS IRRIG 1000ML POUR BTL (IV SOLUTION) ×2 IMPLANT
PACK BASIN DAY SURGERY FS (CUSTOM PROCEDURE TRAY) ×3 IMPLANT
PENCIL SMOKE EVACUATOR (MISCELLANEOUS) ×3 IMPLANT
SLEEVE SCD COMPRESS KNEE MED (MISCELLANEOUS) ×3 IMPLANT
SPONGE LAP 18X18 RF (DISPOSABLE) ×5 IMPLANT
SUT ETHILON 2 0 FS 18 (SUTURE) IMPLANT
SUT MNCRL AB 4-0 PS2 18 (SUTURE) ×4 IMPLANT
SUT SILK 2 0 SH (SUTURE) IMPLANT
SUT VIC AB 0 SH 27 (SUTURE) ×2 IMPLANT
SUT VIC AB 2-0 SH 27 (SUTURE) ×3
SUT VIC AB 2-0 SH 27XBRD (SUTURE) IMPLANT
SUT VIC AB 3-0 SH 27 (SUTURE)
SUT VIC AB 3-0 SH 27X BRD (SUTURE) IMPLANT
SUT VICRYL 3-0 CR8 SH (SUTURE) IMPLANT
SWAB COLLECTION DEVICE MRSA (MISCELLANEOUS) IMPLANT
SWAB CULTURE ESWAB REG 1ML (MISCELLANEOUS) IMPLANT
SYR BULB IRRIG 60ML STRL (SYRINGE) ×1 IMPLANT
SYR CONTROL 10ML LL (SYRINGE) ×3 IMPLANT
TOWEL GREEN STERILE FF (TOWEL DISPOSABLE) ×3 IMPLANT
TRAY DSU PREP LF (CUSTOM PROCEDURE TRAY) ×2 IMPLANT
TUBE CONNECTING 20'X1/4 (TUBING) ×1
TUBE CONNECTING 20X1/4 (TUBING) ×1 IMPLANT
UNDERPAD 30X36 HEAVY ABSORB (UNDERPADS AND DIAPERS) ×2 IMPLANT
YANKAUER SUCT BULB TIP NO VENT (SUCTIONS) ×2 IMPLANT
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
## 2020-09-07 NOTE — Anesthesia Preprocedure Evaluation (Addendum)
Anesthesia Evaluation  Patient identified by MRN, date of birth, ID band Patient awake    Reviewed: Allergy & Precautions, H&P , NPO status , Patient's Chart, lab work & pertinent test results  Airway Mallampati: II  TM Distance: >3 FB Neck ROM: Full    Dental  (+) Dental Advisory Given,    Pulmonary asthma , sleep apnea , Current Smoker and Patient abstained from smoking.,    Pulmonary exam normal breath sounds clear to auscultation       Cardiovascular + Peripheral Vascular Disease  Normal cardiovascular exam Rhythm:Regular Rate:Normal     Neuro/Psych PSYCHIATRIC DISORDERS Bipolar Disorder Schizophrenia negative neurological ROS     GI/Hepatic negative GI ROS, Neg liver ROS,   Endo/Other  negative endocrine ROS  Renal/GU negative Renal ROS     Musculoskeletal negative musculoskeletal ROS (+)   Abdominal   Peds  Hematology negative hematology ROS (+)   Anesthesia Other Findings   Reproductive/Obstetrics negative OB ROS                          Anesthesia Physical  Anesthesia Plan  ASA: II  Anesthesia Plan: General   Post-op Pain Management:    Induction: Intravenous  PONV Risk Score and Plan: 1 and Ondansetron, Dexamethasone, Treatment may vary due to age or medical condition, Midazolam, Propofol infusion and TIVA  Airway Management Planned: Oral ETT  Additional Equipment:   Intra-op Plan:   Post-operative Plan: Extubation in OR  Informed Consent: I have reviewed the patients History and Physical, chart, labs and discussed the procedure including the risks, benefits and alternatives for the proposed anesthesia with the patient or authorized representative who has indicated his/her understanding and acceptance.     Dental advisory given  Plan Discussed with: CRNA  Anesthesia Plan Comments:       Anesthesia Quick Evaluation

## 2020-09-07 NOTE — Transfer of Care (Signed)
Immediate Anesthesia Transfer of Care Note  Patient: Douglas Sloan  Procedure(s) Performed: FOREIGN BODY REMOVAL FROM BACK (Left Back)  Patient Location: PACU  Anesthesia Type:General  Level of Consciousness: drowsy, patient cooperative and responds to stimulation  Airway & Oxygen Therapy: Patient Spontanous Breathing and Patient connected to face mask oxygen  Post-op Assessment: Report given to RN and Post -op Vital signs reviewed and stable  Post vital signs: Reviewed and stable  Last Vitals:  Vitals Value Taken Time  BP    Temp    Pulse    Resp 16 09/07/20 1632  SpO2    Vitals shown include unvalidated device data.  Last Pain:  Vitals:   09/07/20 1238  TempSrc: Oral  PainSc: 9          Complications: No complications documented.

## 2020-09-07 NOTE — Anesthesia Procedure Notes (Signed)
Procedure Name: Intubation Date/Time: 09/07/2020 2:38 PM Performed by: Glory Buff, CRNA Pre-anesthesia Checklist: Patient identified, Emergency Drugs available, Suction available and Patient being monitored Patient Re-evaluated:Patient Re-evaluated prior to induction Oxygen Delivery Method: Circle system utilized Preoxygenation: Pre-oxygenation with 100% oxygen Induction Type: IV induction Ventilation: Mask ventilation without difficulty Laryngoscope Size: Miller and 3 Grade View: Grade II Tube type: Oral Tube size: 7.5 mm Number of attempts: 1 Airway Equipment and Method: Stylet and Oral airway Placement Confirmation: ETT inserted through vocal cords under direct vision,  positive ETCO2 and breath sounds checked- equal and bilateral Secured at: 22 cm Tube secured with: Tape Dental Injury: Teeth and Oropharynx as per pre-operative assessment

## 2020-09-07 NOTE — Discharge Instructions (Signed)
May shower beginning 09/08/2020. Do not peel off or scrub skin glue. May allow warm soapy water to run over incision, then rinse and pat dry. Do not soak in any water (tubs, hot tubs, pools, lakes, oceans) for one week.   No lifting or raising your arms over your head for one week.    Call the office at [phone] for temperature greater than 101.89F, worsening pain, redness or warmth at the incision site.  Please call [phone] to make an appointment for 2 weeks after surgery for wound check.    Post Anesthesia Home Care Instructions  Activity: Get plenty of rest for the remainder of the day. A responsible individual must stay with you for 24 hours following the procedure.  For the next 24 hours, DO NOT: -Drive a car -Paediatric nurse -Drink alcoholic beverages -Take any medication unless instructed by your physician -Make any legal decisions or sign important papers.  Meals: Start with liquid foods such as gelatin or soup. Progress to regular foods as tolerated. Avoid greasy, spicy, heavy foods. If nausea and/or vomiting occur, drink only clear liquids until the nausea and/or vomiting subsides. Call your physician if vomiting continues.  Special Instructions/Symptoms: Your throat may feel dry or sore from the anesthesia or the breathing tube placed in your throat during surgery. If this causes discomfort, gargle with warm salt water. The discomfort should disappear within 24 hours.  If you had a scopolamine patch placed behind your ear for the management of post- operative nausea and/or vomiting:  1. The medication in the patch is effective for 72 hours, after which it should be removed.  Wrap patch in a tissue and discard in the trash. Wash hands thoroughly with soap and water. 2. You may remove the patch earlier than 72 hours if you experience unpleasant side effects which may include dry mouth, dizziness or visual disturbances. 3. Avoid touching the patch. Wash your hands with  soap and water after contact with the patch.
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
## 2020-09-07 NOTE — Progress Notes (Signed)
Pt refused to let nurses do anything for discharge. He would not let nurses get vital signs or let discharge instructions be reviewed however he took the packet with him. Dr. Lissa Hoard attempted to speak with patient and he refused to be spoken to. He stated he was leaving and nurse walked him and his S/O out.
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
## 2020-09-12 ENCOUNTER — Encounter (HOSPITAL_BASED_OUTPATIENT_CLINIC_OR_DEPARTMENT_OTHER): Payer: Self-pay | Admitting: Surgery

## 2020-10-30 DIAGNOSIS — M795 Residual foreign body in soft tissue: Secondary | ICD-10-CM | POA: Diagnosis not present

## 2020-11-02 ENCOUNTER — Other Ambulatory Visit: Payer: Self-pay | Admitting: Neurosurgery

## 2020-11-05 ENCOUNTER — Other Ambulatory Visit (HOSPITAL_COMMUNITY)
Admission: RE | Admit: 2020-11-05 | Discharge: 2020-11-05 | Disposition: A | Discharge status: Home / Self Care | Payer: Managed Care, Other (non HMO) | Source: Ambulatory Visit | Attending: Neurosurgery | Admitting: Neurosurgery

## 2020-11-05 DIAGNOSIS — U071 COVID-19: Secondary | ICD-10-CM | POA: Diagnosis not present

## 2020-11-05 DIAGNOSIS — Z01812 Encounter for preprocedural laboratory examination: Secondary | ICD-10-CM | POA: Diagnosis not present

## 2020-11-05 LAB — SARS CORONAVIRUS 2 (TAT 6-24 HRS): SARS Coronavirus 2: POSITIVE — AB

## 2020-11-06 ENCOUNTER — Other Ambulatory Visit: Payer: Self-pay | Admitting: Neurosurgery

## 2020-11-06 NOTE — Progress Notes (Signed)
Notified Nikki at Dr. Lacy Duverney office of patient's covid test results.  Lexine Baton stated that she will communicate with Dr. Christella Noa.

## 2020-11-08 ENCOUNTER — Ambulatory Visit: Admit: 2020-11-08 | Payer: Managed Care, Other (non HMO) | Admitting: Neurosurgery

## 2020-11-08 SURGERY — LUMBAR LAMINECTOMY/DECOMPRESSION MICRODISCECTOMY 1 LEVEL
Anesthesia: General

## 2020-11-16 ENCOUNTER — Other Ambulatory Visit: Payer: Self-pay

## 2020-11-16 ENCOUNTER — Encounter (HOSPITAL_COMMUNITY): Payer: Self-pay | Admitting: Neurosurgery

## 2020-11-16 NOTE — Progress Notes (Signed)
PCP - Glean Salvo, NP Cardiologist - n/a  Chest x-ray - 06/17/20 (1V) EKG - 08/13/20 Stress Test - n/a ECHO - n/a Cardiac Cath - n/a  STOP now taking any Aspirin (unless otherwise instructed by your surgeon), Aleve, Naproxen, Ibuprofen, Motrin, Advil, Goody's, BC's, all herbal medications, fish oil, and all vitamins.   Coronavirus Screening Positive covid on 11/05/20. Do you have any of the following symptoms:  Cough yes/no: No Fever (>100.82F)  yes/no: No Runny nose yes/no: No Sore throat yes/no: No Difficulty breathing/shortness of breath  yes/no: No  Have you traveled in the last 14 days and where? yes/no: No  Patient verbalized understanding of instructions that were given via phone.

## 2020-11-21 ENCOUNTER — Ambulatory Visit (HOSPITAL_COMMUNITY): Payer: 59

## 2020-11-21 ENCOUNTER — Other Ambulatory Visit: Payer: Self-pay

## 2020-11-21 ENCOUNTER — Ambulatory Visit (HOSPITAL_COMMUNITY)
Admission: RE | Admit: 2020-11-21 | Discharge: 2020-11-21 | Disposition: A | Discharge status: Home / Self Care | Payer: 59 | Attending: Neurosurgery | Admitting: Neurosurgery

## 2020-11-21 ENCOUNTER — Encounter (HOSPITAL_COMMUNITY)
Admission: RE | Disposition: A | Discharge status: Home / Self Care | Payer: Self-pay | Source: Home / Self Care | Attending: Neurosurgery

## 2020-11-21 ENCOUNTER — Encounter (HOSPITAL_COMMUNITY): Payer: Self-pay | Admitting: Neurosurgery

## 2020-11-21 ENCOUNTER — Ambulatory Visit (HOSPITAL_COMMUNITY): Payer: 59 | Admitting: Certified Registered"

## 2020-11-21 DIAGNOSIS — M795 Residual foreign body in soft tissue: Secondary | ICD-10-CM | POA: Insufficient documentation

## 2020-11-21 DIAGNOSIS — Z419 Encounter for procedure for purposes other than remedying health state, unspecified: Secondary | ICD-10-CM

## 2020-11-21 DIAGNOSIS — Z91040 Latex allergy status: Secondary | ICD-10-CM | POA: Diagnosis not present

## 2020-11-21 DIAGNOSIS — F17210 Nicotine dependence, cigarettes, uncomplicated: Secondary | ICD-10-CM | POA: Insufficient documentation

## 2020-11-21 DIAGNOSIS — R69 Illness, unspecified: Secondary | ICD-10-CM | POA: Diagnosis not present

## 2020-11-21 DIAGNOSIS — Z86718 Personal history of other venous thrombosis and embolism: Secondary | ICD-10-CM | POA: Diagnosis not present

## 2020-11-21 DIAGNOSIS — Z1810 Retained metal fragments, unspecified: Secondary | ICD-10-CM | POA: Diagnosis not present

## 2020-11-21 DIAGNOSIS — E559 Vitamin D deficiency, unspecified: Secondary | ICD-10-CM | POA: Diagnosis not present

## 2020-11-21 DIAGNOSIS — S21241A Puncture wound with foreign body of right back wall of thorax without penetration into thoracic cavity, initial encounter: Secondary | ICD-10-CM | POA: Diagnosis not present

## 2020-11-21 HISTORY — PX: LUMBAR LAMINECTOMY/DECOMPRESSION MICRODISCECTOMY: SHX5026

## 2020-11-21 SURGERY — LUMBAR LAMINECTOMY/DECOMPRESSION MICRODISCECTOMY 1 LEVEL
Anesthesia: General

## 2020-11-21 MED ORDER — DEXMEDETOMIDINE (PRECEDEX) IN NS 20 MCG/5ML (4 MCG/ML) IV SYRINGE
PREFILLED_SYRINGE | INTRAVENOUS | Status: AC
  Filled 2020-11-21: qty 5

## 2020-11-21 MED ORDER — CHLORHEXIDINE GLUCONATE CLOTH 2 % EX PADS: 6.0000 | MEDICATED_PAD | Freq: Once | CUTANEOUS | Status: DC

## 2020-11-21 MED ORDER — MIDAZOLAM HCL 2 MG/2ML IJ SOLN
INTRAMUSCULAR | Status: DC | PRN
  Administered 2020-11-21: 2 mg via INTRAVENOUS

## 2020-11-21 MED ORDER — PROPOFOL 10 MG/ML IV BOLUS
INTRAVENOUS | Status: AC
  Filled 2020-11-21: qty 20

## 2020-11-21 MED ORDER — PROMETHAZINE HCL 25 MG/ML IJ SOLN: 6.2500 mg | INTRAMUSCULAR | Status: DC | PRN

## 2020-11-21 MED ORDER — ORAL CARE MOUTH RINSE: 15.0000 mL | Freq: Once | OROMUCOSAL | Status: AC

## 2020-11-21 MED ORDER — ONDANSETRON HCL 4 MG/2ML IJ SOLN
INTRAMUSCULAR | Status: AC
  Filled 2020-11-21: qty 2

## 2020-11-21 MED ORDER — LIDOCAINE 2% (20 MG/ML) 5 ML SYRINGE
INTRAMUSCULAR | Status: AC
  Filled 2020-11-21: qty 5

## 2020-11-21 MED ORDER — CEFAZOLIN SODIUM-DEXTROSE 2-4 GM/100ML-% IV SOLN
2.0000 g | INTRAVENOUS | Status: AC
  Administered 2020-11-21: 2 g via INTRAVENOUS
  Filled 2020-11-21: qty 100

## 2020-11-21 MED ORDER — DEXAMETHASONE SODIUM PHOSPHATE 10 MG/ML IJ SOLN
INTRAMUSCULAR | Status: AC
  Filled 2020-11-21: qty 1

## 2020-11-21 MED ORDER — FENTANYL CITRATE (PF) 100 MCG/2ML IJ SOLN
INTRAMUSCULAR | Status: DC | PRN
  Administered 2020-11-21: 150 ug via INTRAVENOUS
  Administered 2020-11-21: 100 ug via INTRAVENOUS

## 2020-11-21 MED ORDER — LIDOCAINE-EPINEPHRINE 0.5 %-1:200000 IJ SOLN
INTRAMUSCULAR | Status: DC | PRN
  Administered 2020-11-21: 10 mL

## 2020-11-21 MED ORDER — ONDANSETRON HCL 4 MG/2ML IJ SOLN
INTRAMUSCULAR | Status: DC | PRN
  Administered 2020-11-21: 4 mg via INTRAVENOUS

## 2020-11-21 MED ORDER — PROPOFOL 10 MG/ML IV BOLUS
INTRAVENOUS | Status: DC | PRN
  Administered 2020-11-21: 200 mg via INTRAVENOUS

## 2020-11-21 MED ORDER — MEPERIDINE HCL 25 MG/ML IJ SOLN
INTRAMUSCULAR | Status: AC
  Administered 2020-11-21: 12.5 mg via INTRAVENOUS
  Filled 2020-11-21: qty 1

## 2020-11-21 MED ORDER — THROMBIN 5000 UNITS EX SOLR
CUTANEOUS | Status: AC
  Filled 2020-11-21: qty 10000

## 2020-11-21 MED ORDER — SUGAMMADEX SODIUM 200 MG/2ML IV SOLN
INTRAVENOUS | Status: DC | PRN
  Administered 2020-11-21: 200 mg via INTRAVENOUS

## 2020-11-21 MED ORDER — ROCURONIUM BROMIDE 10 MG/ML (PF) SYRINGE
PREFILLED_SYRINGE | INTRAVENOUS | Status: AC
  Filled 2020-11-21: qty 10

## 2020-11-21 MED ORDER — MEPERIDINE HCL 25 MG/ML IJ SOLN
6.2500 mg | INTRAMUSCULAR | Status: DC | PRN
  Administered 2020-11-21: 12.5 mg via INTRAVENOUS

## 2020-11-21 MED ORDER — MIDAZOLAM HCL 2 MG/2ML IJ SOLN
INTRAMUSCULAR | Status: AC
  Filled 2020-11-21: qty 2

## 2020-11-21 MED ORDER — BUPIVACAINE HCL (PF) 0.5 % IJ SOLN
INTRAMUSCULAR | Status: AC
  Filled 2020-11-21: qty 30

## 2020-11-21 MED ORDER — DEXAMETHASONE SODIUM PHOSPHATE 10 MG/ML IJ SOLN
INTRAMUSCULAR | Status: DC | PRN
  Administered 2020-11-21: 5 mg via INTRAVENOUS

## 2020-11-21 MED ORDER — OXYCODONE HCL 5 MG PO TABS: 5.0000 mg | ORAL_TABLET | Freq: Once | ORAL | Status: DC | PRN

## 2020-11-21 MED ORDER — FENTANYL CITRATE (PF) 250 MCG/5ML IJ SOLN
INTRAMUSCULAR | Status: AC
  Filled 2020-11-21: qty 5

## 2020-11-21 MED ORDER — LIDOCAINE 2% (20 MG/ML) 5 ML SYRINGE
INTRAMUSCULAR | Status: DC | PRN
  Administered 2020-11-21: 30 mg via INTRAVENOUS

## 2020-11-21 MED ORDER — ACETAMINOPHEN 500 MG PO TABS: 1000.0000 mg | ORAL_TABLET | Freq: Once | ORAL | Status: AC

## 2020-11-21 MED ORDER — 0.9 % SODIUM CHLORIDE (POUR BTL) OPTIME
TOPICAL | Status: DC | PRN
  Administered 2020-11-21: 1000 mL

## 2020-11-21 MED ORDER — BUPIVACAINE HCL (PF) 0.5 % IJ SOLN
INTRAMUSCULAR | Status: DC | PRN
  Administered 2020-11-21: 30 mL

## 2020-11-21 MED ORDER — HEMOSTATIC AGENTS (NO CHARGE) OPTIME
TOPICAL | Status: DC | PRN
  Administered 2020-11-21: 1 via TOPICAL

## 2020-11-21 MED ORDER — HYDROMORPHONE HCL 1 MG/ML IJ SOLN: 0.2500 mg | INTRAMUSCULAR | Status: DC | PRN

## 2020-11-21 MED ORDER — OXYCODONE HCL 5 MG/5ML PO SOLN: 5.0000 mg | Freq: Once | ORAL | Status: DC | PRN

## 2020-11-21 MED ORDER — MIDAZOLAM HCL 2 MG/2ML IJ SOLN: 0.5000 mg | Freq: Once | INTRAMUSCULAR | Status: DC | PRN

## 2020-11-21 MED ORDER — THROMBIN 5000 UNITS EX SOLR
CUTANEOUS | Status: DC | PRN
  Administered 2020-11-21 (×2): 5000 [IU] via TOPICAL

## 2020-11-21 MED ORDER — CHLORHEXIDINE GLUCONATE 0.12 % MT SOLN
OROMUCOSAL | Status: AC
  Administered 2020-11-21: 15 mL via OROMUCOSAL
  Filled 2020-11-21: qty 15

## 2020-11-21 MED ORDER — LACTATED RINGERS IV SOLN: INTRAVENOUS | Status: DC

## 2020-11-21 MED ORDER — ROCURONIUM BROMIDE 10 MG/ML (PF) SYRINGE
PREFILLED_SYRINGE | INTRAVENOUS | Status: DC | PRN
  Administered 2020-11-21: 70 mg via INTRAVENOUS

## 2020-11-21 MED ORDER — ACETAMINOPHEN 500 MG PO TABS
ORAL_TABLET | ORAL | Status: AC
  Administered 2020-11-21: 1000 mg via ORAL
  Filled 2020-11-21: qty 2

## 2020-11-21 MED ORDER — CEFAZOLIN SODIUM-DEXTROSE 2-4 GM/100ML-% IV SOLN: 2.0000 g | INTRAVENOUS | Status: DC

## 2020-11-21 MED ORDER — DEXMEDETOMIDINE (PRECEDEX) IN NS 20 MCG/5ML (4 MCG/ML) IV SYRINGE
PREFILLED_SYRINGE | INTRAVENOUS | Status: DC | PRN
  Administered 2020-11-21: 8 ug via INTRAVENOUS

## 2020-11-21 MED ORDER — CHLORHEXIDINE GLUCONATE 0.12 % MT SOLN: 15.0000 mL | Freq: Once | OROMUCOSAL | Status: AC

## 2020-11-21 MED ORDER — LIDOCAINE-EPINEPHRINE 0.5 %-1:200000 IJ SOLN
INTRAMUSCULAR | Status: AC
  Filled 2020-11-21: qty 1

## 2020-11-21 SURGICAL SUPPLY — 52 items
ADH SKN CLS APL DERMABOND .7 (GAUZE/BANDAGES/DRESSINGS) ×1
APL SKNCLS STERI-STRIP NONHPOA (GAUZE/BANDAGES/DRESSINGS)
BAND INSRT 18 STRL LF DISP RB (MISCELLANEOUS)
BAND RUBBER #18 3X1/16 STRL (MISCELLANEOUS) ×2 IMPLANT
BENZOIN TINCTURE PRP APPL 2/3 (GAUZE/BANDAGES/DRESSINGS) IMPLANT
BLADE CLIPPER SURG (BLADE) IMPLANT
BUR MATCHSTICK NEURO 3.0 LAGG (BURR) ×1 IMPLANT
BUR PRECISION FLUTE 5.0 (BURR) IMPLANT
CANISTER SUCT 3000ML PPV (MISCELLANEOUS) ×2 IMPLANT
CARTRIDGE OIL MAESTRO DRILL (MISCELLANEOUS) ×1 IMPLANT
COVER WAND RF STERILE (DRAPES) ×2 IMPLANT
DECANTER SPIKE VIAL GLASS SM (MISCELLANEOUS) ×2 IMPLANT
DERMABOND ADVANCED (GAUZE/BANDAGES/DRESSINGS) ×1
DERMABOND ADVANCED .7 DNX12 (GAUZE/BANDAGES/DRESSINGS) ×1 IMPLANT
DIFFUSER DRILL AIR PNEUMATIC (MISCELLANEOUS) ×1 IMPLANT
DRAPE C-ARM 42X72 X-RAY (DRAPES) ×2 IMPLANT
DRAPE LAPAROTOMY 100X72X124 (DRAPES) ×2 IMPLANT
DRAPE MICROSCOPE LEICA (MISCELLANEOUS) ×1 IMPLANT
DRAPE SURG 17X23 STRL (DRAPES) ×2 IMPLANT
DRSG OPSITE POSTOP 4X6 (GAUZE/BANDAGES/DRESSINGS) ×1 IMPLANT
DURAPREP 26ML APPLICATOR (WOUND CARE) ×2 IMPLANT
ELECT REM PT RETURN 9FT ADLT (ELECTROSURGICAL) ×2
ELECTRODE REM PT RTRN 9FT ADLT (ELECTROSURGICAL) ×1 IMPLANT
GAUZE 4X4 16PLY RFD (DISPOSABLE) IMPLANT
GAUZE SPONGE 4X4 12PLY STRL (GAUZE/BANDAGES/DRESSINGS) IMPLANT
GLOVE ECLIPSE 6.5 STRL STRAW (GLOVE) ×2 IMPLANT
GLOVE EXAM NITRILE XL STR (GLOVE) IMPLANT
GOWN STRL REUS W/ TWL LRG LVL3 (GOWN DISPOSABLE) ×2 IMPLANT
GOWN STRL REUS W/ TWL XL LVL3 (GOWN DISPOSABLE) IMPLANT
GOWN STRL REUS W/TWL 2XL LVL3 (GOWN DISPOSABLE) IMPLANT
GOWN STRL REUS W/TWL LRG LVL3 (GOWN DISPOSABLE) ×4
GOWN STRL REUS W/TWL XL LVL3 (GOWN DISPOSABLE) ×2
KIT BASIN OR (CUSTOM PROCEDURE TRAY) ×2 IMPLANT
KIT TURNOVER KIT B (KITS) ×2 IMPLANT
NDL HYPO 25X1 1.5 SAFETY (NEEDLE) ×1 IMPLANT
NDL SPNL 18GX3.5 QUINCKE PK (NEEDLE) IMPLANT
NEEDLE HYPO 25X1 1.5 SAFETY (NEEDLE) ×2 IMPLANT
NEEDLE SPNL 18GX3.5 QUINCKE PK (NEEDLE) ×2 IMPLANT
NS IRRIG 1000ML POUR BTL (IV SOLUTION) ×2 IMPLANT
OIL CARTRIDGE MAESTRO DRILL (MISCELLANEOUS)
PACK LAMINECTOMY NEURO (CUSTOM PROCEDURE TRAY) ×2 IMPLANT
PAD ARMBOARD 7.5X6 YLW CONV (MISCELLANEOUS) ×3 IMPLANT
SPONGE LAP 4X18 RFD (DISPOSABLE) IMPLANT
SPONGE SURGIFOAM ABS GEL SZ50 (HEMOSTASIS) ×2 IMPLANT
STRIP CLOSURE SKIN 1/2X4 (GAUZE/BANDAGES/DRESSINGS) IMPLANT
SUT VIC AB 0 CT1 18XCR BRD8 (SUTURE) ×1 IMPLANT
SUT VIC AB 0 CT1 8-18 (SUTURE) ×2
SUT VIC AB 2-0 CT1 18 (SUTURE) ×2 IMPLANT
SUT VIC AB 3-0 SH 8-18 (SUTURE) ×2 IMPLANT
TOWEL GREEN STERILE (TOWEL DISPOSABLE) ×2 IMPLANT
TOWEL GREEN STERILE FF (TOWEL DISPOSABLE) ×2 IMPLANT
WATER STERILE IRR 1000ML POUR (IV SOLUTION) ×2 IMPLANT

## 2020-11-21 NOTE — OR Nursing (Signed)
Pt is awake,alert and oriented. Pt is in NAD at this time. Pt and family verbalized understanding of poc and discharge instructions. instructions given to family and reviewed prior to discharge.Pt is ambulatory to wheelchair with stand by assist but not needed with steady gait.  Pt taken to front lobby and placed in car with family.   
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
## 2020-11-21 NOTE — OR Nursing (Signed)
Specimen given to Reece Levy RN progress note placed on chart
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
## 2020-11-21 NOTE — Anesthesia Procedure Notes (Signed)
Procedure Name: Intubation Date/Time: 11/21/2020 9:51 AM Performed by: Barrington Ellison, CRNA Pre-anesthesia Checklist: Patient identified, Emergency Drugs available, Suction available and Patient being monitored Patient Re-evaluated:Patient Re-evaluated prior to induction Oxygen Delivery Method: Circle System Utilized Preoxygenation: Pre-oxygenation with 100% oxygen Induction Type: IV induction Ventilation: Mask ventilation without difficulty Laryngoscope Size: Mac and 4 Grade View: Grade I Tube type: Oral Tube size: 7.5 mm Number of attempts: 1 Airway Equipment and Method: Stylet and Oral airway Placement Confirmation: ETT inserted through vocal cords under direct vision,  positive ETCO2 and breath sounds checked- equal and bilateral Secured at: 22 cm Tube secured with: Tape Dental Injury: Teeth and Oropharynx as per pre-operative assessment
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
## 2020-11-22 ENCOUNTER — Encounter (HOSPITAL_COMMUNITY): Payer: Self-pay | Admitting: Neurosurgery

## 2020-12-11 DIAGNOSIS — G5621 Lesion of ulnar nerve, right upper limb: Secondary | ICD-10-CM | POA: Diagnosis not present

## 2020-12-11 DIAGNOSIS — G5601 Carpal tunnel syndrome, right upper limb: Secondary | ICD-10-CM | POA: Diagnosis not present
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
## 2022-05-10 ENCOUNTER — Encounter (HOSPITAL_COMMUNITY): Payer: Self-pay

## 2022-05-10 ENCOUNTER — Emergency Department (HOSPITAL_COMMUNITY): Payer: Self-pay

## 2022-05-10 ENCOUNTER — Emergency Department (HOSPITAL_COMMUNITY)
Admission: EM | Admit: 2022-05-10 | Discharge: 2022-05-11 | Disposition: A | Discharge status: Home / Self Care | Payer: Self-pay | Attending: Emergency Medicine | Admitting: Emergency Medicine

## 2022-05-10 DIAGNOSIS — A630 Anogenital (venereal) warts: Secondary | ICD-10-CM | POA: Insufficient documentation

## 2022-05-10 DIAGNOSIS — M545 Low back pain, unspecified: Secondary | ICD-10-CM | POA: Insufficient documentation

## 2022-05-10 DIAGNOSIS — N50812 Left testicular pain: Secondary | ICD-10-CM

## 2022-05-10 LAB — URINALYSIS, ROUTINE W REFLEX MICROSCOPIC
Bilirubin Urine: NEGATIVE
Glucose, UA: NEGATIVE mg/dL
Hgb urine dipstick: NEGATIVE
Ketones, ur: NEGATIVE mg/dL
Nitrite: NEGATIVE
Protein, ur: NEGATIVE mg/dL
Specific Gravity, Urine: 1.029 (ref 1.005–1.030)
pH: 5 (ref 5.0–8.0)

## 2022-05-10 LAB — CBC WITH DIFFERENTIAL/PLATELET
Abs Immature Granulocytes: 0.07 10*3/uL (ref 0.00–0.07)
Basophils Absolute: 0.1 10*3/uL (ref 0.0–0.1)
Basophils Relative: 1 %
Eosinophils Absolute: 0.1 10*3/uL (ref 0.0–0.5)
Eosinophils Relative: 1 %
HCT: 44.7 % (ref 39.0–52.0)
Hemoglobin: 15.2 g/dL (ref 13.0–17.0)
Immature Granulocytes: 1 %
Lymphocytes Relative: 26 %
Lymphs Abs: 3.5 10*3/uL (ref 0.7–4.0)
MCH: 30.3 pg (ref 26.0–34.0)
MCHC: 34 g/dL (ref 30.0–36.0)
MCV: 89 fL (ref 80.0–100.0)
Monocytes Absolute: 1.1 10*3/uL — ABNORMAL HIGH (ref 0.1–1.0)
Monocytes Relative: 8 %
Neutro Abs: 8.4 10*3/uL — ABNORMAL HIGH (ref 1.7–7.7)
Neutrophils Relative %: 63 %
Platelets: 281 10*3/uL (ref 150–400)
RBC: 5.02 MIL/uL (ref 4.22–5.81)
RDW: 13.2 % (ref 11.5–15.5)
WBC: 13.3 10*3/uL — ABNORMAL HIGH (ref 4.0–10.5)
nRBC: 0 % (ref 0.0–0.2)

## 2022-05-10 LAB — COMPREHENSIVE METABOLIC PANEL
ALT: 43 U/L (ref 0–44)
AST: 34 U/L (ref 15–41)
Albumin: 3.9 g/dL (ref 3.5–5.0)
Alkaline Phosphatase: 76 U/L (ref 38–126)
Anion gap: 12 (ref 5–15)
BUN: 8 mg/dL (ref 6–20)
CO2: 26 mmol/L (ref 22–32)
Calcium: 9.5 mg/dL (ref 8.9–10.3)
Chloride: 102 mmol/L (ref 98–111)
Creatinine, Ser: 0.97 mg/dL (ref 0.61–1.24)
GFR, Estimated: >60 mL/min (ref >60)
Glucose, Bld: 87 mg/dL (ref 70–99)
Potassium: 4.1 mmol/L (ref 3.5–5.1)
Sodium: 140 mmol/L (ref 135–145)
Total Bilirubin: 0.9 mg/dL (ref 0.3–1.2)
Total Protein: 7.2 g/dL (ref 6.5–8.1)

## 2022-05-10 LAB — TROPONIN I (HIGH SENSITIVITY)
Troponin I (High Sensitivity): 5 ng/L (ref <18)
Troponin I (High Sensitivity): 5 ng/L (ref <18)

## 2022-05-10 LAB — LIPASE, BLOOD: Lipase: 40 U/L (ref 11–51)

## 2022-05-10 NOTE — ED Provider Triage Note (Signed)
Emergency Medicine Provider Triage Evaluation Note  Douglas Sloan , a 41 y.o. male  was evaluated in triage.  Pt complains of right sided chest pain/abdominal pain, and shortness of breath.  He states his symptoms have been ongoing.     Physical Exam  BP (!) 147/100   Pulse 86   Temp 97.8 F (36.6 C) (Oral)   Resp 16   SpO2 100%  Gen:   Awake, no distress   Resp:  Normal effort  MSK:   Moves extremities without difficulty  Other:  RRR, Lungs CTAB  Medical Decision Making  Medically screening exam initiated at 7:33 PM.  Appropriate orders placed.  Lurline Idol was informed that the remainder of the evaluation will be completed by another provider, this initial triage assessment does not replace that evaluation, and the importance of remaining in the ED until their evaluation is complete.     Lorin Glass, [place] 05/10/22 1940

## 2022-05-10 NOTE — ED Triage Notes (Signed)
Pt states that he has back pain and SOB, chest discomfort is worse on the R side, that has been going on for a while and was worse today. Worse with movement

## 2022-05-11 ENCOUNTER — Emergency Department (HOSPITAL_COMMUNITY): Payer: Self-pay

## 2022-05-11 MED ORDER — OXYCODONE-ACETAMINOPHEN 5-325 MG PO TABS
2.0000 | ORAL_TABLET | Freq: Once | ORAL | Status: AC
  Administered 2022-05-11: 2 via ORAL
  Filled 2022-05-11: qty 2

## 2022-05-11 MED ORDER — CYCLOBENZAPRINE HCL 10 MG PO TABS
5.0000 mg | ORAL_TABLET | Freq: Once | ORAL | Status: AC
  Administered 2022-05-11: 5 mg via ORAL
  Filled 2022-05-11: qty 1

## 2022-05-11 MED ORDER — CYCLOBENZAPRINE HCL 10 MG PO TABS
10.0000 mg | ORAL_TABLET | Freq: Two times a day (BID) | ORAL | 0 refills | Status: DC | PRN
Start: 2022-05-11 — End: 2022-08-06

## 2022-05-11 NOTE — Discharge Instructions (Signed)
X-rays of your back were obtained and there is no evidence of acute bony abnormality Please take Flexeril as needed for pain Please call Dr. Tresa Moore for follow-up regarding your testicular pain and the genital wart that was found. Please recheck your blood pressure with your regular doctor Return if you are having worsening or new symptoms.

## 2022-05-11 NOTE — ED Notes (Signed)
Pt aggravated in lobby cussing
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
## 2022-05-12 LAB — GC/CHLAMYDIA PROBE AMP (~~LOC~~) NOT AT ARMC
Chlamydia: NEGATIVE
Comment: NEGATIVE
Comment: NORMAL
Neisseria Gonorrhea: NEGATIVE

## 2022-07-14 ENCOUNTER — Encounter: Payer: Self-pay | Admitting: Physical Medicine and Rehabilitation
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
## 2022-08-06 ENCOUNTER — Encounter
Payer: Commercial Managed Care - HMO | Attending: Physical Medicine and Rehabilitation | Admitting: Physical Medicine and Rehabilitation

## 2022-08-06 ENCOUNTER — Encounter: Payer: Self-pay | Admitting: Physical Medicine and Rehabilitation

## 2022-08-06 VITALS — BP 139/88 | HR 76 | Ht 71.0 in | Wt 225.0 lb

## 2022-08-06 DIAGNOSIS — Z87898 Personal history of other specified conditions: Secondary | ICD-10-CM | POA: Diagnosis present

## 2022-08-06 DIAGNOSIS — M5441 Lumbago with sciatica, right side: Secondary | ICD-10-CM | POA: Diagnosis present

## 2022-08-06 DIAGNOSIS — G894 Chronic pain syndrome: Secondary | ICD-10-CM | POA: Insufficient documentation

## 2022-08-06 DIAGNOSIS — W3400XA Accidental discharge from unspecified firearms or gun, initial encounter: Secondary | ICD-10-CM | POA: Insufficient documentation

## 2022-08-06 DIAGNOSIS — G8929 Other chronic pain: Secondary | ICD-10-CM | POA: Diagnosis present
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
## 2022-08-06 NOTE — Patient Instructions (Signed)
You have been referred to physical therapy for your back and core.  You have bloodwork ordered for nerve pain and to test for diabetes. Once these results are back, you will get a call and we will start either Elavil or Cymbalta for nerve pain. Continue gabapentin until that time.  I will check with your past neurosurgeon/records on if an MRI of your low back can be done. If not, we will get a CT of your low back to look for pinched nerves, with potential plan for epidural steroid injections in the future.   Follow up in 1 month. If needed at that time, we can perform a urine drug screen and discuss other options for pain control. We can also discuss trigger point injections.
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
## 2022-08-06 NOTE — Assessment & Plan Note (Signed)
prior UDS on record: 06/18/20 +opiates (?appropriate, 08/2020 first oxy script per PDMP) and THC;  06/14/15 +THC; 07/21/12 +cocaine, THC  Discussed risk of long term dependence and low likelihood of narcotic scripts given this record. Patient endorsing he has been clean of all substances for several weeks.  At follow up, can consider repeat UDS and, if as expected, discuss low-risk medication options.
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
## 2022-08-07 ENCOUNTER — Telehealth: Payer: Self-pay | Admitting: Physical Medicine and Rehabilitation

## 2022-08-07 DIAGNOSIS — G8929 Other chronic pain: Secondary | ICD-10-CM

## 2022-08-07 LAB — BASIC METABOLIC PANEL
BUN/Creatinine Ratio: 9 (ref 9–20)
BUN: 9 mg/dL (ref 6–24)
CO2: 25 mmol/L (ref 20–29)
Calcium: 9.9 mg/dL (ref 8.7–10.2)
Chloride: 103 mmol/L (ref 96–106)
Creatinine, Ser: 0.96 mg/dL (ref 0.76–1.27)
Glucose: 97 mg/dL (ref 70–99)
Potassium: 4.8 mmol/L (ref 3.5–5.2)
Sodium: 141 mmol/L (ref 134–144)
eGFR: 102 mL/min/{1.73_m2} (ref >59)

## 2022-08-07 LAB — HEMOGLOBIN A1C
Est. average glucose Bld gHb Est-mCnc: 120 mg/dL
Hgb A1c MFr Bld: 5.8 % — ABNORMAL HIGH (ref 4.8–5.6)

## 2022-08-07 LAB — B12 AND FOLATE PANEL
Folate: 6 ng/mL (ref >3.0)
Vitamin B-12: 708 pg/mL (ref 232–1245)

## 2022-08-07 MED ORDER — DULOXETINE HCL 30 MG PO CPEP: 30.0000 mg | ORAL_CAPSULE | Freq: Every day | ORAL | 3 refills | Status: DC

## 2022-08-07 NOTE — Telephone Encounter (Signed)
Reviewed labwork with patient over the phone, including recent BMP showing good renal function, Folate/Vitamin D within normal range, and HA1C showing he is 5.8 in pre-diabetic range.   For pre-diabetes, discussed lifestyle modification including avoiding sugary foods and drinks, trying to stay below 25 g added sugar daily intake, along with regular exercise.   As previously discussed, given good renal function and historically good liver function, sent a script for Duloxetine 30 mg tablets once daily # 30 tabs R2. At follow up visit, will discuss effect and, if tolerating well, will increase to 60 mg daily and work on weaning gabapentin.   Gertie Gowda, DO 08/07/2022

## 2022-08-11 ENCOUNTER — Other Ambulatory Visit: Payer: Self-pay | Admitting: Ophthalmology
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
## 2022-08-18 ENCOUNTER — Other Ambulatory Visit: Payer: Self-pay

## 2022-08-18 ENCOUNTER — Ambulatory Visit: Payer: Commercial Managed Care - HMO | Attending: Physical Medicine and Rehabilitation

## 2022-08-18 DIAGNOSIS — M5459 Other low back pain: Secondary | ICD-10-CM | POA: Insufficient documentation

## 2022-08-18 DIAGNOSIS — M6281 Muscle weakness (generalized): Secondary | ICD-10-CM | POA: Diagnosis present

## 2022-08-18 DIAGNOSIS — M5441 Lumbago with sciatica, right side: Secondary | ICD-10-CM | POA: Diagnosis not present

## 2022-08-18 DIAGNOSIS — G8929 Other chronic pain: Secondary | ICD-10-CM | POA: Diagnosis not present

## 2022-08-18 DIAGNOSIS — G894 Chronic pain syndrome: Secondary | ICD-10-CM | POA: Diagnosis not present

## 2022-08-26 ENCOUNTER — Ambulatory Visit: Payer: Commercial Managed Care - HMO

## 2022-08-26 DIAGNOSIS — M5459 Other low back pain: Secondary | ICD-10-CM

## 2022-08-26 DIAGNOSIS — M6281 Muscle weakness (generalized): Secondary | ICD-10-CM
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
## 2022-08-28 ENCOUNTER — Ambulatory Visit: Payer: Commercial Managed Care - HMO | Attending: Physical Medicine and Rehabilitation

## 2022-08-28 DIAGNOSIS — M5459 Other low back pain: Secondary | ICD-10-CM | POA: Insufficient documentation

## 2022-08-28 DIAGNOSIS — M6281 Muscle weakness (generalized): Secondary | ICD-10-CM | POA: Insufficient documentation

## 2022-08-30 ENCOUNTER — Emergency Department (HOSPITAL_BASED_OUTPATIENT_CLINIC_OR_DEPARTMENT_OTHER)
Admission: EM | Admit: 2022-08-30 | Discharge: 2022-08-30 | Disposition: A | Discharge status: Home / Self Care | Payer: Commercial Managed Care - HMO | Attending: Emergency Medicine | Admitting: Emergency Medicine

## 2022-08-30 ENCOUNTER — Encounter (HOSPITAL_BASED_OUTPATIENT_CLINIC_OR_DEPARTMENT_OTHER): Payer: Self-pay | Admitting: Emergency Medicine

## 2022-08-30 ENCOUNTER — Emergency Department (HOSPITAL_BASED_OUTPATIENT_CLINIC_OR_DEPARTMENT_OTHER): Payer: Commercial Managed Care - HMO

## 2022-08-30 DIAGNOSIS — J4521 Mild intermittent asthma with (acute) exacerbation: Secondary | ICD-10-CM | POA: Diagnosis not present

## 2022-08-30 DIAGNOSIS — Z91040 Latex allergy status: Secondary | ICD-10-CM | POA: Diagnosis not present

## 2022-08-30 DIAGNOSIS — Z7951 Long term (current) use of inhaled steroids: Secondary | ICD-10-CM | POA: Insufficient documentation

## 2022-08-30 DIAGNOSIS — R0602 Shortness of breath: Secondary | ICD-10-CM | POA: Diagnosis present

## 2022-08-30 MED ORDER — IPRATROPIUM-ALBUTEROL 0.5-2.5 (3) MG/3ML IN SOLN
3.0000 mL | Freq: Once | RESPIRATORY_TRACT | Status: AC
  Administered 2022-08-30: 3 mL via RESPIRATORY_TRACT
  Filled 2022-08-30: qty 3

## 2022-08-30 MED ORDER — ALBUTEROL SULFATE HFA 108 (90 BASE) MCG/ACT IN AERS
2.0000 | INHALATION_SPRAY | Freq: Once | RESPIRATORY_TRACT | Status: AC
  Administered 2022-08-30: 2 via RESPIRATORY_TRACT
  Filled 2022-08-30: qty 6.7

## 2022-08-30 MED ORDER — PREDNISONE 20 MG PO TABS: ORAL_TABLET | ORAL | 0 refills | Status: DC

## 2022-08-30 MED ORDER — ALBUTEROL SULFATE (2.5 MG/3ML) 0.083% IN NEBU
2.5000 mg | INHALATION_SOLUTION | Freq: Once | RESPIRATORY_TRACT | Status: AC
  Administered 2022-08-30: 2.5 mg via RESPIRATORY_TRACT
  Filled 2022-08-30: qty 3

## 2022-08-30 MED ORDER — PREDNISONE 50 MG PO TABS
60.0000 mg | ORAL_TABLET | Freq: Once | ORAL | Status: AC
  Administered 2022-08-30: 60 mg via ORAL
  Filled 2022-08-30: qty 1

## 2022-08-30 NOTE — Discharge Instructions (Signed)
Use the albuterol inhaler 1-2 puffs every 4 hours as needed for cough, shortness of breath, or wheezing.  You are being prescribed prednisone, start this prescription tomorrow as you were given the first dose today.  If you need the inhaler more than this, develop new or worsening shortness of breath, high fever, coughing up blood, or any other new/concerning symptoms and return to the ER for evaluation.
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
## 2022-08-30 NOTE — ED Triage Notes (Signed)
Pt reports chest tightness 3 days ago; feels SHOB, out of inhaler; NAD noted at this time

## 2022-09-01 ENCOUNTER — Encounter: Payer: Self-pay | Admitting: Physical Medicine and Rehabilitation

## 2022-09-01 ENCOUNTER — Encounter
Payer: Commercial Managed Care - HMO | Attending: Physical Medicine and Rehabilitation | Admitting: Physical Medicine and Rehabilitation

## 2022-09-01 VITALS — BP 149/91 | HR 74 | Ht 71.0 in | Wt 239.0 lb

## 2022-09-01 DIAGNOSIS — G8929 Other chronic pain: Secondary | ICD-10-CM | POA: Diagnosis not present

## 2022-09-01 DIAGNOSIS — M7918 Myalgia, other site: Secondary | ICD-10-CM | POA: Insufficient documentation

## 2022-09-01 DIAGNOSIS — G894 Chronic pain syndrome: Secondary | ICD-10-CM | POA: Insufficient documentation

## 2022-09-01 DIAGNOSIS — M5441 Lumbago with sciatica, right side: Secondary | ICD-10-CM | POA: Diagnosis present

## 2022-09-01 DIAGNOSIS — Z87898 Personal history of other specified conditions: Secondary | ICD-10-CM | POA: Insufficient documentation

## 2022-09-01 MED ORDER — AMITRIPTYLINE HCL 25 MG PO TABS: 25.0000 mg | ORAL_TABLET | Freq: Every day | ORAL | 3 refills | Status: DC

## 2022-09-01 NOTE — Patient Instructions (Signed)
Stop Duloxetine  Start amitriptyline 25 mg tablets 1 tab nightly. After 1 week, if tolerating well, can increase to 2 tablets nightly. I have sent 3 refills of this to your pharmacy.   You can also resume gabapentin 300 mg at nighttime in addition to amitriptyline to help with nighttime nerve pains. If sleep remains difficult after you have reached a steady state on amitriptyline (3-4 weeks), you may increase the nighttime gabapentin to two 200 mg tablets nightly.   I will be ordering a CT of your back to look at possible pinched nerves in anticipation of epidural steroid injections. Once we have this imaging, if no contraindications, I will refer you to Dr Letta Pate for these injections.   Follow up with me in 1-2 weeks for trigger point injections into the mid- and low-back

## 2022-09-01 NOTE — Assessment & Plan Note (Signed)
See above  Follow up with me in 1-2 weeks for trigger point injections into the mid- and low-back for myofascial pain  Recommend continuing PT, although if no benefit or if causing worsening symptoms it is reasonable to stop
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
## 2022-09-01 NOTE — Assessment & Plan Note (Addendum)
Stop Duloxetine due to side effects of seeing "stars"  Start amitriptyline 25 mg tablets 1 tab nightly. After 1 week, if tolerating well, can increase to 2 tablets nightly. I have sent 3 refills of this to your pharmacy.   You can also resume gabapentin 300 mg at nighttime in addition to amitriptyline to help with nighttime nerve pains. If sleep remains difficult after you have reached a steady state on amitriptyline (3-4 weeks), you may increase the nighttime gabapentin to two 300 mg tablets nightly.   I will be ordering a CT w/o Lumbar spine to look at possible pinched nerves in anticipation of epidural steroid injections (Suspect R S1). Once we have this imaging, if no contraindications, I will refer you to Dr Letta Pate for these injections.

## 2022-09-01 NOTE — Assessment & Plan Note (Signed)
Discussed continued avoidance of narcotic pain medications d/t Hx +THC, cocaine on UDS  Patient endorses he has been clean of substances for quite some time. He is also successfully quitting cigarettes since last visit. Uses OTC Delta 9 gummies for adjunctive pain control and sleep only.   no UDS or pain contract required

## 2022-09-02 ENCOUNTER — Ambulatory Visit: Payer: Commercial Managed Care - HMO

## 2022-09-02 DIAGNOSIS — M5459 Other low back pain: Secondary | ICD-10-CM

## 2022-09-02 DIAGNOSIS — M6281 Muscle weakness (generalized): Secondary | ICD-10-CM
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
## 2022-09-04 ENCOUNTER — Ambulatory Visit: Payer: Commercial Managed Care - HMO

## 2022-09-04 DIAGNOSIS — M6281 Muscle weakness (generalized): Secondary | ICD-10-CM

## 2022-09-04 DIAGNOSIS — M5459 Other low back pain: Secondary | ICD-10-CM

## 2022-09-09 ENCOUNTER — Encounter: Payer: Self-pay | Admitting: Physical Medicine and Rehabilitation

## 2022-09-09 ENCOUNTER — Ambulatory Visit: Payer: Commercial Managed Care - HMO

## 2022-09-09 ENCOUNTER — Telehealth: Payer: Self-pay

## 2022-09-09 NOTE — Telephone Encounter (Signed)
Spoke with patient regarding missed appointment, confirmed next appointment time.  Margarette Canada, PTA 09/09/22 11:13 AM
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
## 2022-09-11 ENCOUNTER — Ambulatory Visit: Payer: Commercial Managed Care - HMO

## 2022-09-11 DIAGNOSIS — M6281 Muscle weakness (generalized): Secondary | ICD-10-CM

## 2022-09-11 DIAGNOSIS — M5459 Other low back pain: Secondary | ICD-10-CM | POA: Diagnosis not present
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
## 2022-09-15 ENCOUNTER — Encounter: Payer: Self-pay | Admitting: Physical Medicine and Rehabilitation

## 2022-09-15 ENCOUNTER — Encounter: Payer: Commercial Managed Care - HMO | Admitting: Physical Medicine and Rehabilitation

## 2022-09-15 VITALS — BP 126/73 | HR 75 | Ht 71.0 in | Wt 236.6 lb

## 2022-09-15 DIAGNOSIS — M5441 Lumbago with sciatica, right side: Secondary | ICD-10-CM

## 2022-09-15 DIAGNOSIS — G8929 Other chronic pain: Secondary | ICD-10-CM | POA: Diagnosis not present

## 2022-09-15 DIAGNOSIS — M7918 Myalgia, other site: Secondary | ICD-10-CM | POA: Diagnosis not present

## 2022-09-15 MED ORDER — LIDOCAINE HCL 1 % IJ SOLN
6.0000 mL | Freq: Once | INTRAMUSCULAR | Status: AC
  Administered 2022-09-15: 6 mL

## 2022-09-15 MED ORDER — TRIAMCINOLONE ACETONIDE 40 MG/ML IJ SUSP
8.0000 mg | Freq: Once | INTRAMUSCULAR | Status: AC
  Administered 2022-09-15: 8 mg
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
## 2022-09-15 NOTE — Patient Instructions (Signed)
-   Resume Usual Activities. Notify Physician of any unusual bleeding, erythema or concern for side effects as reviewed above. - Apply ice prn for pain - Tylenol prn for pain - Follow up in  2-3 months to assess response to injection - Will re-submit auth for CT anticipating ESI for low back once 6 week Tx mark is up (later this week); once completed, will refer to Dr. Read Drivers - Continue amitryptiline 25 mg 2 tabs and gabapentin 300 mg 2 tabs QHS

## 2022-09-16 ENCOUNTER — Ambulatory Visit: Payer: Commercial Managed Care - HMO

## 2022-09-16 DIAGNOSIS — M5459 Other low back pain: Secondary | ICD-10-CM | POA: Diagnosis not present

## 2022-09-16 DIAGNOSIS — M6281 Muscle weakness (generalized): Secondary | ICD-10-CM
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
## 2022-09-23 ENCOUNTER — Ambulatory Visit: Payer: Commercial Managed Care - HMO

## 2022-09-23 DIAGNOSIS — M5459 Other low back pain: Secondary | ICD-10-CM

## 2022-09-23 DIAGNOSIS — M6281 Muscle weakness (generalized): Secondary | ICD-10-CM
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
## 2022-09-25 ENCOUNTER — Ambulatory Visit: Payer: Commercial Managed Care - HMO

## 2022-09-25 DIAGNOSIS — M5459 Other low back pain: Secondary | ICD-10-CM | POA: Diagnosis not present

## 2022-09-25 DIAGNOSIS — M6281 Muscle weakness (generalized): Secondary | ICD-10-CM
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
## 2022-09-30 ENCOUNTER — Ambulatory Visit: Payer: Commercial Managed Care - HMO | Attending: Physical Medicine and Rehabilitation

## 2022-09-30 DIAGNOSIS — M5459 Other low back pain: Secondary | ICD-10-CM | POA: Diagnosis present

## 2022-09-30 DIAGNOSIS — M6281 Muscle weakness (generalized): Secondary | ICD-10-CM | POA: Insufficient documentation
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
## 2022-10-02 ENCOUNTER — Ambulatory Visit: Payer: Commercial Managed Care - HMO

## 2022-10-02 DIAGNOSIS — M6281 Muscle weakness (generalized): Secondary | ICD-10-CM

## 2022-10-02 DIAGNOSIS — M5459 Other low back pain: Secondary | ICD-10-CM

## 2022-10-07 ENCOUNTER — Ambulatory Visit: Payer: Commercial Managed Care - HMO
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
## 2022-10-09 ENCOUNTER — Ambulatory Visit: Payer: Commercial Managed Care - HMO
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
## 2022-10-10 ENCOUNTER — Ambulatory Visit: Payer: Commercial Managed Care - HMO

## 2022-10-10 DIAGNOSIS — M6281 Muscle weakness (generalized): Secondary | ICD-10-CM

## 2022-10-10 DIAGNOSIS — M5459 Other low back pain: Secondary | ICD-10-CM
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
## 2022-10-17 ENCOUNTER — Ambulatory Visit (HOSPITAL_COMMUNITY)
Admission: RE | Admit: 2022-10-17 | Discharge: 2022-10-17 | Disposition: A | Discharge status: Home / Self Care | Payer: Commercial Managed Care - HMO | Source: Ambulatory Visit | Attending: Physical Medicine and Rehabilitation | Admitting: Physical Medicine and Rehabilitation

## 2022-10-17 DIAGNOSIS — M5441 Lumbago with sciatica, right side: Secondary | ICD-10-CM | POA: Insufficient documentation

## 2022-10-17 DIAGNOSIS — G8929 Other chronic pain: Secondary | ICD-10-CM | POA: Diagnosis present

## 2022-11-03 ENCOUNTER — Encounter: Payer: Self-pay | Admitting: Physical Medicine and Rehabilitation

## 2022-11-25 ENCOUNTER — Encounter
Payer: Commercial Managed Care - HMO | Attending: Physical Medicine and Rehabilitation | Admitting: Physical Medicine & Rehabilitation

## 2022-11-25 DIAGNOSIS — G894 Chronic pain syndrome: Secondary | ICD-10-CM | POA: Insufficient documentation

## 2022-11-25 DIAGNOSIS — Z87898 Personal history of other specified conditions: Secondary | ICD-10-CM | POA: Insufficient documentation

## 2022-11-25 DIAGNOSIS — G8929 Other chronic pain: Secondary | ICD-10-CM | POA: Insufficient documentation

## 2022-11-25 DIAGNOSIS — M5441 Lumbago with sciatica, right side: Secondary | ICD-10-CM | POA: Insufficient documentation

## 2022-11-25 DIAGNOSIS — M7918 Myalgia, other site: Secondary | ICD-10-CM | POA: Insufficient documentation

## 2022-11-28 ENCOUNTER — Telehealth: Payer: Self-pay

## 2022-11-28 NOTE — Telephone Encounter (Signed)
PA submitted for SI Jt Injection- denied
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
## 2022-12-17 ENCOUNTER — Encounter
Payer: Commercial Managed Care - HMO | Attending: Physical Medicine and Rehabilitation | Admitting: Physical Medicine and Rehabilitation

## 2022-12-17 ENCOUNTER — Encounter: Payer: Self-pay | Admitting: Physical Medicine and Rehabilitation

## 2022-12-17 VITALS — BP 129/78 | HR 73 | Ht 71.0 in | Wt 234.0 lb

## 2022-12-17 DIAGNOSIS — M7918 Myalgia, other site: Secondary | ICD-10-CM

## 2022-12-17 DIAGNOSIS — G894 Chronic pain syndrome: Secondary | ICD-10-CM | POA: Diagnosis present

## 2022-12-17 DIAGNOSIS — M5441 Lumbago with sciatica, right side: Secondary | ICD-10-CM | POA: Diagnosis not present

## 2022-12-17 DIAGNOSIS — G8929 Other chronic pain: Secondary | ICD-10-CM | POA: Diagnosis present

## 2022-12-17 DIAGNOSIS — M533 Sacrococcygeal disorders, not elsewhere classified: Secondary | ICD-10-CM

## 2022-12-17 MED ORDER — PREGABALIN 75 MG PO CAPS: 75.0000 mg | ORAL_CAPSULE | Freq: Every day | ORAL | 5 refills | Status: DC

## 2022-12-17 MED ORDER — METHOCARBAMOL 750 MG PO TABS: 750.0000 mg | ORAL_TABLET | Freq: Three times a day (TID) | ORAL | 2 refills | Status: DC | PRN

## 2022-12-17 NOTE — Assessment & Plan Note (Signed)
You have been prescribed Lyrica to take at nighttime. STOP gabapentin and amttryptilline

## 2022-12-17 NOTE — Assessment & Plan Note (Signed)
I will also re-refer you to Dr. Letta Pate for SI joint injection.   See me for general follow up in 3 months

## 2022-12-17 NOTE — Assessment & Plan Note (Signed)
  Continue Tylenol PM. I have prescribed robaxin to take up to 3 times daily for muscle spasms; do not take every day for more than 2 consecutive weeks.  Follow up with me in 1-2 weeks for trigger point injections.

## 2022-12-17 NOTE — Patient Instructions (Addendum)
You have been prescribed Lyrica to take at nighttime. STOP gabapentin and amttryptilline  Continue Tylenol PM. I have prescribed robaxin to take up to 3 times daily for muscle spasms; do not take every day for more than 2 consecutive weeks.  Follow up with me in 1-2 weeks for trigger point injections. I will also re-refer you to Dr. Letta Pate for SI joint injection.   See me for general follow up in 3 months
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
## 2023-01-09 ENCOUNTER — Telehealth: Payer: Self-pay | Admitting: Physical Medicine and Rehabilitation

## 2023-01-09 MED ORDER — METHOCARBAMOL 750 MG PO TABS: 750.0000 mg | ORAL_TABLET | Freq: Three times a day (TID) | ORAL | 2 refills | Status: DC | PRN

## 2023-01-09 MED ORDER — PREGABALIN 75 MG PO CAPS: 75.0000 mg | ORAL_CAPSULE | Freq: Every day | ORAL | 5 refills | Status: DC

## 2023-01-09 NOTE — Addendum Note (Signed)
Addended by: Durel Salts on: 01/09/2023 04:37 PM   Modules accepted: Orders

## 2023-01-09 NOTE — Telephone Encounter (Signed)
He wanted to let Dr. Tressa Busman know that he has the money for his medication now if she can resend them.

## 2023-01-12 ENCOUNTER — Telehealth: Payer: Self-pay | Admitting: *Deleted

## 2023-01-12 NOTE — Telephone Encounter (Signed)
Douglas Sloan (KeyRosezella Rumpf [id number] Pregabalin 75MG  capsules Status: PA Response - DeniedCreated: March 15th, 2024 [id number] Sent: March 18th, 2024

## 2023-01-12 NOTE — Telephone Encounter (Signed)
Lyrica PA Key: [id number])

## 2023-01-21 ENCOUNTER — Encounter: Payer: 59 | Attending: Physical Medicine and Rehabilitation | Admitting: Physical Medicine and Rehabilitation

## 2023-01-21 ENCOUNTER — Encounter: Payer: Self-pay | Admitting: Physical Medicine and Rehabilitation

## 2023-01-21 VITALS — BP 147/86 | HR 73 | Temp 98.2°F | Ht 71.0 in | Wt 236.0 lb

## 2023-01-21 DIAGNOSIS — G8929 Other chronic pain: Secondary | ICD-10-CM | POA: Diagnosis not present

## 2023-01-21 DIAGNOSIS — M533 Sacrococcygeal disorders, not elsewhere classified: Secondary | ICD-10-CM | POA: Insufficient documentation

## 2023-01-21 DIAGNOSIS — M7918 Myalgia, other site: Secondary | ICD-10-CM | POA: Insufficient documentation

## 2023-01-21 DIAGNOSIS — M5441 Lumbago with sciatica, right side: Secondary | ICD-10-CM | POA: Insufficient documentation

## 2023-01-21 MED ORDER — LIDOCAINE HCL 1 % IJ SOLN
6.0000 mL | Freq: Once | INTRAMUSCULAR | Status: AC
  Administered 2023-01-21: 6 mL via INTRADERMAL

## 2023-01-21 MED ORDER — TRIAMCINOLONE ACETONIDE 40 MG/ML IJ SUSP
5.0000 mg | Freq: Once | INTRAMUSCULAR | Status: AC
  Administered 2023-01-21: 5.2 mg via INTRAMUSCULAR
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
## 2023-01-21 NOTE — Patient Instructions (Signed)
-   Resume Usual Activities. Notify Physician of any unusual bleeding, erythema or concern for side effects as reviewed above. - Apply ice prn for pain - Tylenol prn for pain - Call me 2-3 weeks tafter SI joint injection to let me know about response - We will continue to work on authorization for Lyrica

## 2023-01-25 NOTE — Assessment & Plan Note (Signed)
At least 3/5 SI joint maneuvers bilaterally + for SI joint pain; attempt to re-authorize for bilateral SO joint injections  - Call me 2-3 weeks tafter SI joint injection to let me know about response

## 2023-01-25 NOTE — Assessment & Plan Note (Signed)
-   Resume Usual Activities. Notify Physician of any unusual bleeding, erythema or concern for side effects as reviewed above. - Apply ice prn for pain - Tylenol prn for pain 

## 2023-01-25 NOTE — Assessment & Plan Note (Signed)
-   We will continue to work on authorization for Lyrica

## 2023-01-27 ENCOUNTER — Encounter: Payer: Medicaid Other | Admitting: Physical Medicine & Rehabilitation

## 2023-01-27 ENCOUNTER — Telehealth: Payer: Self-pay | Admitting: Physical Medicine and Rehabilitation

## 2023-01-27 ENCOUNTER — Encounter: Payer: Self-pay | Admitting: Physical Medicine and Rehabilitation

## 2023-01-27 DIAGNOSIS — G8929 Other chronic pain: Secondary | ICD-10-CM
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
## 2023-03-03 ENCOUNTER — Encounter: Payer: Medicaid Other | Attending: Physical Medicine and Rehabilitation | Admitting: Physical Medicine & Rehabilitation

## 2023-03-03 ENCOUNTER — Encounter: Payer: Self-pay | Admitting: Physical Medicine & Rehabilitation

## 2023-03-03 VITALS — Ht 71.0 in

## 2023-03-03 DIAGNOSIS — G894 Chronic pain syndrome: Secondary | ICD-10-CM | POA: Insufficient documentation

## 2023-03-03 DIAGNOSIS — M5441 Lumbago with sciatica, right side: Secondary | ICD-10-CM | POA: Insufficient documentation

## 2023-03-03 DIAGNOSIS — G8929 Other chronic pain: Secondary | ICD-10-CM

## 2023-03-03 DIAGNOSIS — M533 Sacrococcygeal disorders, not elsewhere classified: Secondary | ICD-10-CM | POA: Diagnosis present

## 2023-03-03 MED ORDER — LIDOCAINE HCL (PF) 2 % IJ SOLN
3.0000 mL | Freq: Once | INTRAMUSCULAR | Status: AC
Start: 2023-03-03 — End: 2023-03-03
  Administered 2023-03-03: 3 mL

## 2023-03-03 MED ORDER — LIDOCAINE HCL 1 % IJ SOLN
5.0000 mL | Freq: Once | INTRAMUSCULAR | Status: AC
Start: 2023-03-03 — End: 2023-03-03
  Administered 2023-03-03: 5 mL

## 2023-03-03 MED ORDER — BETAMETHASONE SOD PHOS & ACET 6 (3-3) MG/ML IJ SUSP
12.0000 mg | Freq: Once | INTRAMUSCULAR | Status: AC
Start: 2023-03-03 — End: 2023-03-03
  Administered 2023-03-03: 12 mg via INTRAMUSCULAR

## 2023-03-03 MED ORDER — IOHEXOL 180 MG/ML  SOLN
3.0000 mL | Freq: Once | INTRAMUSCULAR | Status: AC
  Administered 2023-03-03: 3 mL via INTRAVENOUS

## 2023-03-03 NOTE — Progress Notes (Signed)
  PROCEDURE RECORD [facility]   Name: Douglas Sloan DOB:[birth year] MRN: [id number]  Date:03/03/2023  Physician: Claudette Laws, MD    Nurse/CMA: Asser Lucena RMA   Allergies:  Allergies  Allergen Reactions   Latex Hives   Duloxetine Other (See Comments)    Nightmares    Consent Signed: Yes.    Is patient diabetic? No.  CBG today? .  Pregnant: No. LMP: No LMP for male patient. (age 41-55)  Anticoagulants: no Anti-inflammatory: no Antibiotics: no  Procedure: L Sacroiliac Joint Injection   Position: Prone Start Time: 9:39AM  End Time: 9:43  Fluoro Time: 14  RN/CMA Layken Doenges RMA  Irasema Chalk RMA     Time 9:28 9:45    BP 141 93 157 89    Pulse 72 71    Respirations 16 16    O2 Sat 99 98    S/S 6 6    Pain Level 7/10 6 1/2     D/C home with Self, patient A & O X 3, D/C instructions reviewed, and sits independently.

## 2023-03-03 NOTE — Progress Notes (Signed)
Left sacroiliac injection under fluoroscopic guidance  Indication: Left Low back and buttocks pain not relieved by medication management and other conservative care.  Informed consent was obtained after describing risks and benefits of the procedure with the patient, this includes bleeding, bruising, infection, paralysis and medication side effects. The patient wishes to proceed and has given written consent. The patient was placed in a prone position. The lumbar and sacral area was marked and prepped with Betadine. A 25-gauge 1-1/2 inch needle was inserted into the skin and subcutaneous tissue and 1 mL of 1% lidocaine was injected. Then a 25-gauge 3 inch spinal needle was inserted under fluoroscopic guidance into the left sacroiliac joint. AP and lateral images were utilized. Isovue 200x0.5 mL under live fluoroscopy demonstrated no intravascular uptake. Then a solution containing one ML of 6 mg per mLbetamethasone and 2 ML of 2% lidocaine MPF was injected x1.5 mL. Patient tolerated the procedure well. Post procedure instructions were given. Please see post procedure form. 

## 2023-03-03 NOTE — Patient Instructions (Signed)
Sacroiliac injection was performed today. A combination of numbing medicine (lidocaine) plus a cortisone medicine (betamethasone) was injected. The injection was done under x-ray guidance. This procedure has been performed to help reduce low back and buttocks pain as well as potentially hip pain. The duration of this injection is variable lasting from hours to  Months. It may repeated if needed. 

## 2023-03-25 ENCOUNTER — Encounter: Payer: Medicaid Other | Admitting: Physical Medicine and Rehabilitation

## 2023-03-25 VITALS — BP 127/86 | HR 81 | Ht 71.0 in | Wt 233.0 lb

## 2023-03-25 DIAGNOSIS — M5441 Lumbago with sciatica, right side: Secondary | ICD-10-CM

## 2023-03-25 DIAGNOSIS — G8929 Other chronic pain: Secondary | ICD-10-CM

## 2023-03-25 DIAGNOSIS — G894 Chronic pain syndrome: Secondary | ICD-10-CM | POA: Diagnosis not present

## 2023-03-25 DIAGNOSIS — M533 Sacrococcygeal disorders, not elsewhere classified: Secondary | ICD-10-CM
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
## 2023-05-05 ENCOUNTER — Encounter: Payer: Medicaid Other | Admitting: Physical Medicine & Rehabilitation

## 2023-05-14 ENCOUNTER — Encounter: Payer: Medicaid Other | Admitting: Physical Medicine & Rehabilitation

## 2023-05-15 ENCOUNTER — Telehealth: Payer: Self-pay

## 2023-06-02 ENCOUNTER — Ambulatory Visit: Payer: Medicaid Other | Admitting: Physical Medicine & Rehabilitation

## 2023-06-04 ENCOUNTER — Ambulatory Visit: Payer: Medicaid Other | Admitting: Physical Medicine & Rehabilitation

## 2023-06-17 ENCOUNTER — Encounter: Payer: Medicaid Other | Admitting: Physical Medicine and Rehabilitation

## 2023-06-30 ENCOUNTER — Encounter: Payer: Medicaid Other | Attending: Physical Medicine & Rehabilitation | Admitting: Physical Medicine & Rehabilitation

## 2023-06-30 ENCOUNTER — Encounter: Payer: Self-pay | Admitting: Physical Medicine & Rehabilitation

## 2023-06-30 VITALS — BP 146/86 | HR 89 | Temp 99.8°F | Ht 71.0 in | Wt 242.0 lb

## 2023-06-30 DIAGNOSIS — G8929 Other chronic pain: Secondary | ICD-10-CM | POA: Diagnosis not present

## 2023-06-30 DIAGNOSIS — M533 Sacrococcygeal disorders, not elsewhere classified: Secondary | ICD-10-CM | POA: Diagnosis not present

## 2023-06-30 MED ORDER — BETAMETHASONE SOD PHOS & ACET 6 (3-3) MG/ML IJ SUSP
3.0000 mg | Freq: Once | INTRAMUSCULAR | Status: AC
Start: 2023-06-30 — End: 2023-06-30
  Administered 2023-06-30: 3 mg via INTRA_ARTICULAR

## 2023-06-30 MED ORDER — LIDOCAINE HCL (PF) 2 % IJ SOLN
1.0000 mL | Freq: Once | INTRAMUSCULAR | Status: AC
Start: 2023-06-30 — End: 2023-06-30
  Administered 2023-06-30: 1 mL

## 2023-06-30 MED ORDER — LIDOCAINE HCL 1 % IJ SOLN
5.0000 mL | Freq: Once | INTRAMUSCULAR | Status: AC
Start: 2023-06-30 — End: 2023-06-30
  Administered 2023-06-30: 5 mL

## 2023-06-30 MED ORDER — IOHEXOL 180 MG/ML  SOLN
1.0000 mL | Freq: Once | INTRAMUSCULAR | Status: AC
  Administered 2023-06-30: 1 mL

## 2023-06-30 NOTE — Progress Notes (Signed)
Left sacroiliac injection under fluoroscopic guidance  Indication: Left Low back and buttocks pain not relieved by medication management and other conservative care.  Informed consent was obtained after describing risks and benefits of the procedure with the patient, this includes bleeding, bruising, infection, paralysis and medication side effects. The patient wishes to proceed and has given written consent. The patient was placed in a prone position. The lumbar and sacral area was marked and prepped with Betadine. A 25-gauge 1-1/2 inch needle was inserted into the skin and subcutaneous tissue and 1 mL of 1% lidocaine was injected. Then a 25-gauge 3 inch spinal needle was inserted under fluoroscopic guidance into the left sacroiliac joint. AP and lateral images were utilized. Omnipaque 180x0.5 mL under live fluoroscopy demonstrated no intravascular uptake and intra articular spread, no extracapsular leak. Then a solution containing one ML of 6 mg per mLbetamethasone and 2 ML of 2% lidocaine MPF was injected x1.5 mL. Patient tolerated the procedure well. Post procedure instructions were given. Please see post procedure form.

## 2023-06-30 NOTE — Progress Notes (Signed)
  PROCEDURE RECORD [facility]   Name: Douglas Sloan DOB:[birth year] MRN: [id number]  Date:06/30/2023  Physician: Claudette Laws, MD    Nurse/CMA: Carsyn Boster S  Allergies:  Allergies  Allergen Reactions   Latex Hives   Duloxetine Other (See Comments)    Nightmares    Consent Signed: Yes.    Is patient diabetic? No.  CBG today? N/a  Pregnant: No. LMP: No LMP for male patient. (age 41-55)  Anticoagulants: no Anti-inflammatory: no Antibiotics: no  Procedure: Left SI joint injection   Position: Prone Start Time: 10:58  End Time: 11:01  Fluoro Time: 24  RN/CMA Rosezetta Schlatter S    Time 10:23 am 11:05    BP 146/86 139/93    Pulse 94 87    Respirations 16 16    O2 Sat 99 97    S/S 6 6    Pain Level 8/10 5     D/C home with Self, patient A & O X 3, D/C instructions reviewed, and sits independently.

## 2023-07-16 ENCOUNTER — Other Ambulatory Visit: Payer: Self-pay

## 2023-07-16 ENCOUNTER — Emergency Department (HOSPITAL_BASED_OUTPATIENT_CLINIC_OR_DEPARTMENT_OTHER): Payer: Medicaid Other

## 2023-07-16 ENCOUNTER — Emergency Department (HOSPITAL_BASED_OUTPATIENT_CLINIC_OR_DEPARTMENT_OTHER)
Admission: EM | Admit: 2023-07-16 | Discharge: 2023-07-16 | Disposition: A | Discharge status: Home / Self Care | Payer: Medicaid Other | Attending: Emergency Medicine | Admitting: Emergency Medicine

## 2023-07-16 ENCOUNTER — Encounter (HOSPITAL_BASED_OUTPATIENT_CLINIC_OR_DEPARTMENT_OTHER): Payer: Self-pay

## 2023-07-16 ENCOUNTER — Emergency Department (HOSPITAL_BASED_OUTPATIENT_CLINIC_OR_DEPARTMENT_OTHER): Payer: Medicaid Other | Admitting: Radiology

## 2023-07-16 DIAGNOSIS — R0602 Shortness of breath: Secondary | ICD-10-CM | POA: Diagnosis present

## 2023-07-16 DIAGNOSIS — Z1152 Encounter for screening for COVID-19: Secondary | ICD-10-CM | POA: Insufficient documentation

## 2023-07-16 DIAGNOSIS — Z7951 Long term (current) use of inhaled steroids: Secondary | ICD-10-CM | POA: Insufficient documentation

## 2023-07-16 DIAGNOSIS — Z91040 Latex allergy status: Secondary | ICD-10-CM | POA: Diagnosis not present

## 2023-07-16 DIAGNOSIS — J181 Lobar pneumonia, unspecified organism: Secondary | ICD-10-CM | POA: Diagnosis not present

## 2023-07-16 DIAGNOSIS — J189 Pneumonia, unspecified organism: Secondary | ICD-10-CM

## 2023-07-16 DIAGNOSIS — J45909 Unspecified asthma, uncomplicated: Secondary | ICD-10-CM | POA: Insufficient documentation

## 2023-07-16 LAB — RESP PANEL BY RT-PCR (RSV, FLU A&B, COVID)  RVPGX2
Influenza A by PCR: NEGATIVE
Influenza B by PCR: NEGATIVE
Resp Syncytial Virus by PCR: NEGATIVE
SARS Coronavirus 2 by RT PCR: NEGATIVE

## 2023-07-16 LAB — COMPREHENSIVE METABOLIC PANEL
ALT: 119 U/L — ABNORMAL HIGH (ref 0–44)
AST: 49 U/L — ABNORMAL HIGH (ref 15–41)
Albumin: 3.3 g/dL — ABNORMAL LOW (ref 3.5–5.0)
Alkaline Phosphatase: 71 U/L (ref 38–126)
Anion gap: 5 (ref 5–15)
BUN: 10 mg/dL (ref 6–20)
CO2: 31 mmol/L (ref 22–32)
Calcium: 8.7 mg/dL — ABNORMAL LOW (ref 8.9–10.3)
Chloride: 104 mmol/L (ref 98–111)
Creatinine, Ser: 0.8 mg/dL (ref 0.61–1.24)
GFR, Estimated: >60 mL/min (ref >60)
Glucose, Bld: 82 mg/dL (ref 70–99)
Potassium: 3.8 mmol/L (ref 3.5–5.1)
Sodium: 140 mmol/L (ref 135–145)
Total Bilirubin: 0.3 mg/dL (ref 0.3–1.2)
Total Protein: 6.1 g/dL — ABNORMAL LOW (ref 6.5–8.1)

## 2023-07-16 LAB — D-DIMER, QUANTITATIVE: D-Dimer, Quant: 1.01 ug/mL-FEU — ABNORMAL HIGH (ref 0.00–0.50)

## 2023-07-16 LAB — CBC
HCT: 39.8 % (ref 39.0–52.0)
Hemoglobin: 13.1 g/dL (ref 13.0–17.0)
MCH: 29.4 pg (ref 26.0–34.0)
MCHC: 32.9 g/dL (ref 30.0–36.0)
MCV: 89.2 fL (ref 80.0–100.0)
Platelets: 504 10*3/uL — ABNORMAL HIGH (ref 150–400)
RBC: 4.46 MIL/uL (ref 4.22–5.81)
RDW: 13.6 % (ref 11.5–15.5)
WBC: 11.2 10*3/uL — ABNORMAL HIGH (ref 4.0–10.5)
nRBC: 0 % (ref 0.0–0.2)

## 2023-07-16 MED ORDER — AMOXICILLIN-POT CLAVULANATE 875-125 MG PO TABS: 1.0000 | ORAL_TABLET | Freq: Two times a day (BID) | ORAL | 0 refills | Status: DC

## 2023-07-16 MED ORDER — IOHEXOL 350 MG/ML SOLN
100.0000 mL | Freq: Once | INTRAVENOUS | Status: AC | PRN
  Administered 2023-07-16: 100 mL via INTRAVENOUS

## 2023-07-16 MED ORDER — ALBUTEROL SULFATE (2.5 MG/3ML) 0.083% IN NEBU
2.5000 mg | INHALATION_SOLUTION | Freq: Once | RESPIRATORY_TRACT | Status: AC
  Administered 2023-07-16: 2.5 mg via RESPIRATORY_TRACT
  Filled 2023-07-16: qty 3

## 2023-07-16 MED ORDER — ALBUTEROL SULFATE HFA 108 (90 BASE) MCG/ACT IN AERS
1.0000 | INHALATION_SPRAY | Freq: Four times a day (QID) | RESPIRATORY_TRACT | 1 refills | Status: AC | PRN
Start: 2023-07-16 — End: ?

## 2023-07-16 MED ORDER — ALBUTEROL SULFATE HFA 108 (90 BASE) MCG/ACT IN AERS: 2.0000 | INHALATION_SPRAY | RESPIRATORY_TRACT | Status: DC | PRN

## 2023-07-16 MED ORDER — IPRATROPIUM-ALBUTEROL 0.5-2.5 (3) MG/3ML IN SOLN
3.0000 mL | Freq: Once | RESPIRATORY_TRACT | Status: AC
  Administered 2023-07-16: 3 mL via RESPIRATORY_TRACT
  Filled 2023-07-16: qty 3
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
## 2023-07-16 NOTE — ED Triage Notes (Signed)
Patient arrives with complaints of worsening shortness of breath. He was seen recently about 3 weeks ago and treated for the same. Reports worsening cough as well. Also reports some chest congestion.   Patient tried nebulizer treatments at home as well with minimal relief.

## 2023-07-16 NOTE — Discharge Instructions (Addendum)
Your CT scan today showed pneumonia. Please take your antibiotics for pneumonia as prescribed.  I recommend close follow-up with PCP for reevaluation.  Please do not hesitate to return to emergency department if worrisome signs symptoms we discussed become apparent.

## 2023-07-29 ENCOUNTER — Encounter: Payer: MEDICAID | Attending: Physical Medicine and Rehabilitation | Admitting: Physical Medicine and Rehabilitation

## 2023-07-29 VITALS — BP 128/90 | HR 104 | Ht 71.0 in | Wt 241.0 lb

## 2023-07-29 DIAGNOSIS — Z87898 Personal history of other specified conditions: Secondary | ICD-10-CM | POA: Diagnosis present

## 2023-07-29 DIAGNOSIS — G894 Chronic pain syndrome: Secondary | ICD-10-CM | POA: Diagnosis not present

## 2023-07-29 DIAGNOSIS — M7918 Myalgia, other site: Secondary | ICD-10-CM | POA: Insufficient documentation

## 2023-07-29 DIAGNOSIS — G8929 Other chronic pain: Secondary | ICD-10-CM

## 2023-07-29 DIAGNOSIS — M5441 Lumbago with sciatica, right side: Secondary | ICD-10-CM | POA: Insufficient documentation

## 2023-07-29 DIAGNOSIS — M533 Sacrococcygeal disorders, not elsewhere classified: Secondary | ICD-10-CM | POA: Insufficient documentation
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
## 2023-07-29 NOTE — Patient Instructions (Signed)
Stop Lyrica and Amutriptilline; these were disposed of in office today  Take Gabapentin 300 mg at nighttime as needed for nerve pain  Take methocarbimonol 750 mg up to three times daily for muscle spasms; with your coughing this should be the most helpful medication for muscle aches and pains  Follow up with pulmonology as soon as possible  Follow up with me in 2 months; this can be telehealth

## 2023-08-07 ENCOUNTER — Ambulatory Visit
Admission: RE | Admit: 2023-08-07 | Discharge: 2023-08-07 | Disposition: A | Discharge status: Home / Self Care | Payer: MEDICAID | Source: Ambulatory Visit | Attending: Nurse Practitioner | Admitting: Nurse Practitioner

## 2023-08-07 ENCOUNTER — Other Ambulatory Visit: Payer: Self-pay | Admitting: Nurse Practitioner

## 2023-08-07 DIAGNOSIS — J4521 Mild intermittent asthma with (acute) exacerbation: Secondary | ICD-10-CM

## 2023-08-07 DIAGNOSIS — R059 Cough, unspecified: Secondary | ICD-10-CM

## 2023-09-11 ENCOUNTER — Encounter: Payer: Medicaid Other | Admitting: Primary Care

## 2023-09-22 ENCOUNTER — Encounter: Payer: Self-pay | Admitting: Primary Care

## 2023-09-28 ENCOUNTER — Encounter: Payer: MEDICAID | Attending: Physical Medicine and Rehabilitation | Admitting: Physical Medicine and Rehabilitation

## 2023-09-28 DIAGNOSIS — R051 Acute cough: Secondary | ICD-10-CM | POA: Insufficient documentation

## 2023-09-28 DIAGNOSIS — M5441 Lumbago with sciatica, right side: Secondary | ICD-10-CM | POA: Insufficient documentation

## 2023-09-28 DIAGNOSIS — G894 Chronic pain syndrome: Secondary | ICD-10-CM | POA: Insufficient documentation

## 2023-09-28 DIAGNOSIS — M533 Sacrococcygeal disorders, not elsewhere classified: Secondary | ICD-10-CM | POA: Insufficient documentation

## 2023-09-28 DIAGNOSIS — G8929 Other chronic pain: Secondary | ICD-10-CM | POA: Insufficient documentation

## 2023-09-28 MED ORDER — METHYLPREDNISOLONE 4 MG PO TBPK: ORAL_TABLET | ORAL | 0 refills | Status: DC

## 2023-09-28 MED ORDER — MELOXICAM 15 MG PO TABS: 15.0000 mg | ORAL_TABLET | Freq: Every day | ORAL | 0 refills | Status: AC
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
## 2024-05-31 NOTE — Progress Notes (Signed)
 This encounter was created in error - please disregard.

## 2024-07-04 ENCOUNTER — Encounter (HOSPITAL_BASED_OUTPATIENT_CLINIC_OR_DEPARTMENT_OTHER): Payer: Self-pay | Admitting: Emergency Medicine

## 2024-07-04 ENCOUNTER — Emergency Department (HOSPITAL_BASED_OUTPATIENT_CLINIC_OR_DEPARTMENT_OTHER): Payer: MEDICAID

## 2024-07-04 ENCOUNTER — Other Ambulatory Visit: Payer: Self-pay

## 2024-07-04 ENCOUNTER — Emergency Department (HOSPITAL_BASED_OUTPATIENT_CLINIC_OR_DEPARTMENT_OTHER)
Admission: EM | Admit: 2024-07-04 | Discharge: 2024-07-04 | Discharge status: Against Medical Advice | Payer: MEDICAID | Attending: Emergency Medicine | Admitting: Emergency Medicine

## 2024-07-04 DIAGNOSIS — R0602 Shortness of breath: Secondary | ICD-10-CM | POA: Diagnosis present

## 2024-07-04 DIAGNOSIS — R0789 Other chest pain: Secondary | ICD-10-CM | POA: Insufficient documentation

## 2024-07-04 DIAGNOSIS — Z5321 Procedure and treatment not carried out due to patient leaving prior to being seen by health care provider: Secondary | ICD-10-CM | POA: Insufficient documentation

## 2024-07-04 LAB — BASIC METABOLIC PANEL WITH GFR
Anion gap: 13 (ref 5–15)
BUN: 10 mg/dL (ref 6–20)
CO2: 24 mmol/L (ref 22–32)
Calcium: 9.2 mg/dL (ref 8.9–10.3)
Chloride: 102 mmol/L (ref 98–111)
Creatinine, Ser: 0.92 mg/dL (ref 0.61–1.24)
GFR, Estimated: >60 mL/min
Glucose, Bld: 134 mg/dL — ABNORMAL HIGH (ref 70–99)
Potassium: 4 mmol/L (ref 3.5–5.1)
Sodium: 139 mmol/L (ref 135–145)

## 2024-07-04 LAB — CBC
HCT: 42.2 % (ref 39.0–52.0)
Hemoglobin: 13.9 g/dL (ref 13.0–17.0)
MCH: 28.3 pg (ref 26.0–34.0)
MCHC: 32.9 g/dL (ref 30.0–36.0)
MCV: 85.9 fL (ref 80.0–100.0)
Platelets: 352 K/uL (ref 150–400)
RBC: 4.91 MIL/uL (ref 4.22–5.81)
RDW: 14.3 % (ref 11.5–15.5)
WBC: 9.9 K/uL (ref 4.0–10.5)
nRBC: 0 % (ref 0.0–0.2)

## 2024-07-04 MED ORDER — ALBUTEROL SULFATE (2.5 MG/3ML) 0.083% IN NEBU
5.0000 mg | INHALATION_SOLUTION | Freq: Once | RESPIRATORY_TRACT | Status: AC
Start: 2024-07-04 — End: 2024-07-04
  Administered 2024-07-04: 5 mg via RESPIRATORY_TRACT
  Filled 2024-07-04: qty 6

## 2024-07-04 MED ORDER — IPRATROPIUM BROMIDE 0.02 % IN SOLN
0.5000 mg | Freq: Once | RESPIRATORY_TRACT | Status: AC
Start: 2024-07-04 — End: 2024-07-04
  Administered 2024-07-04: 0.5 mg via RESPIRATORY_TRACT
  Filled 2024-07-04: qty 2.5
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
## 2024-07-05 ENCOUNTER — Other Ambulatory Visit: Payer: Self-pay

## 2024-07-05 ENCOUNTER — Emergency Department (HOSPITAL_COMMUNITY): Payer: MEDICAID

## 2024-07-05 ENCOUNTER — Emergency Department (HOSPITAL_COMMUNITY)
Admission: EM | Admit: 2024-07-05 | Discharge: 2024-07-05 | Disposition: A | Discharge status: Home / Self Care | Payer: MEDICAID | Attending: Emergency Medicine | Admitting: Emergency Medicine

## 2024-07-05 ENCOUNTER — Encounter (HOSPITAL_COMMUNITY): Payer: Self-pay

## 2024-07-05 ENCOUNTER — Telehealth: Payer: Self-pay | Admitting: Surgery

## 2024-07-05 DIAGNOSIS — J45909 Unspecified asthma, uncomplicated: Secondary | ICD-10-CM | POA: Insufficient documentation

## 2024-07-05 DIAGNOSIS — J069 Acute upper respiratory infection, unspecified: Secondary | ICD-10-CM | POA: Diagnosis not present

## 2024-07-05 DIAGNOSIS — M7989 Other specified soft tissue disorders: Secondary | ICD-10-CM | POA: Insufficient documentation

## 2024-07-05 DIAGNOSIS — Z91040 Latex allergy status: Secondary | ICD-10-CM | POA: Insufficient documentation

## 2024-07-05 DIAGNOSIS — D72829 Elevated white blood cell count, unspecified: Secondary | ICD-10-CM | POA: Diagnosis not present

## 2024-07-05 DIAGNOSIS — R059 Cough, unspecified: Secondary | ICD-10-CM | POA: Diagnosis present

## 2024-07-05 LAB — RESP PANEL BY RT-PCR (RSV, FLU A&B, COVID)  RVPGX2
Influenza A by PCR: NEGATIVE
Influenza B by PCR: NEGATIVE
Resp Syncytial Virus by PCR: NEGATIVE
SARS Coronavirus 2 by RT PCR: NEGATIVE

## 2024-07-05 LAB — CBC
HCT: 45.3 % (ref 39.0–52.0)
Hemoglobin: 14.2 g/dL (ref 13.0–17.0)
MCH: 27.8 pg (ref 26.0–34.0)
MCHC: 31.3 g/dL (ref 30.0–36.0)
MCV: 88.8 fL (ref 80.0–100.0)
Platelets: 324 K/uL (ref 150–400)
RBC: 5.1 MIL/uL (ref 4.22–5.81)
RDW: 14.4 % (ref 11.5–15.5)
WBC: 10.9 K/uL — ABNORMAL HIGH (ref 4.0–10.5)
nRBC: 0 % (ref 0.0–0.2)

## 2024-07-05 LAB — BASIC METABOLIC PANEL WITH GFR
Anion gap: 9 (ref 5–15)
BUN: 9 mg/dL (ref 6–20)
CO2: 28 mmol/L (ref 22–32)
Calcium: 9.3 mg/dL (ref 8.9–10.3)
Chloride: 100 mmol/L (ref 98–111)
Creatinine, Ser: 0.96 mg/dL (ref 0.61–1.24)
GFR, Estimated: >60 mL/min (ref >60)
Glucose, Bld: 112 mg/dL — ABNORMAL HIGH (ref 70–99)
Potassium: 4 mmol/L (ref 3.5–5.1)
Sodium: 137 mmol/L (ref 135–145)

## 2024-07-05 LAB — TROPONIN T, HIGH SENSITIVITY
Troponin T High Sensitivity: 15 ng/L (ref 0–19)
Troponin T High Sensitivity: 18 ng/L (ref 0–19)

## 2024-07-05 LAB — D-DIMER, QUANTITATIVE: D-Dimer, Quant: <0.27 ug{FEU}/mL (ref 0.00–0.50)

## 2024-07-05 MED ORDER — IBUPROFEN 800 MG PO TABS
800.0000 mg | ORAL_TABLET | Freq: Once | ORAL | Status: AC
  Administered 2024-07-05: 800 mg via ORAL
  Filled 2024-07-05: qty 1
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
## 2024-07-05 NOTE — ED Notes (Addendum)
Patient requesting medication for headache.

## 2024-07-05 NOTE — ED Notes (Signed)
 Patient said he does not want to wait for the nebulizer machine. Dr. Levander updated.

## 2024-07-05 NOTE — ED Triage Notes (Signed)
 PT stated he was seen [facility]. PT states he is having shortness of breath, and bilateral leg pain and swelling with worst pain in his left leg. PT has a history of DVT in the left leg

## 2024-07-05 NOTE — ED Notes (Signed)
 Patient transported to X-ray

## 2024-07-05 NOTE — ED Notes (Signed)
 Vascular at bedside

## 2024-07-05 NOTE — Telephone Encounter (Signed)
 [facility] ED RNCM received message from SW at [facility] that patient needs a nebulizer machine. Reviewed chart no orders noted for nebulizer machine, reached out to EDP to place orders awaiting orders.

## 2024-07-05 NOTE — ED Notes (Signed)
 Patient requesting ibuprofen  for headache. Dr. Levander notified.

## 2024-08-01 ENCOUNTER — Encounter: Payer: MEDICAID | Attending: Physical Medicine and Rehabilitation | Admitting: Physical Medicine and Rehabilitation

## 2024-08-01 ENCOUNTER — Ambulatory Visit
Admission: RE | Admit: 2024-08-01 | Discharge: 2024-08-01 | Disposition: A | Discharge status: Home / Self Care | Payer: MEDICAID | Source: Ambulatory Visit | Attending: Physical Medicine and Rehabilitation | Admitting: Physical Medicine and Rehabilitation

## 2024-08-01 ENCOUNTER — Encounter: Payer: Self-pay | Admitting: Physical Medicine and Rehabilitation

## 2024-08-01 VITALS — BP 148/95 | HR 74 | Ht 71.0 in | Wt 256.0 lb

## 2024-08-01 DIAGNOSIS — G894 Chronic pain syndrome: Secondary | ICD-10-CM | POA: Diagnosis present

## 2024-08-01 DIAGNOSIS — M25562 Pain in left knee: Secondary | ICD-10-CM

## 2024-08-01 DIAGNOSIS — M5442 Lumbago with sciatica, left side: Secondary | ICD-10-CM | POA: Diagnosis present

## 2024-08-01 DIAGNOSIS — G8929 Other chronic pain: Secondary | ICD-10-CM | POA: Insufficient documentation

## 2024-08-01 DIAGNOSIS — M25522 Pain in left elbow: Secondary | ICD-10-CM | POA: Insufficient documentation

## 2024-08-01 MED ORDER — DICLOFENAC SODIUM 1 % EX GEL: 4.0000 g | Freq: Four times a day (QID) | CUTANEOUS | 11 refills | Status: DC

## 2024-08-01 NOTE — Patient Instructions (Signed)
 Patient no longer taking Robaxin  or Elavil .  Please take a photo of your home TENS unit and send it to me through MyChart so I can order the correct refill of the leads for you.  Use Voltaren gel topically to the knee and low back as needed for pain.  You can also use this on the elbow up to 4 times daily.  You can continue use of the tramadol  that you have at home, this should not interact with your other medications as you are currently not on any other antidepressants.  I will not refill this medication at this time.  Get x-ray of your left knee; I will send you a message through MyChart regarding results, and schedule you for a left knee injection if indicated.  I am giving you a prescription for a left knee patellar brace, to keep your patella stabilized and provide extra support when you walk.  I am sending you to physical therapy for your knee/back and Occupational Therapy for your left elbow.  I will look into if Dr. Carilyn is doing ulnar nerve entrapment injection; if not, we will wait until after OT and then send you back to orthopedics if pain has not improved.  Follow-up with me in 2 months.  If you continue to abstain from marijuana use, at that time we can talk about a pain contract, drug screen, however patient is not interested in narcotic medications at this time and I agree with this management plan.
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
## 2024-08-02 ENCOUNTER — Telehealth: Payer: Self-pay

## 2024-08-02 ENCOUNTER — Encounter: Payer: Self-pay | Admitting: Physical Medicine and Rehabilitation

## 2024-08-05 ENCOUNTER — Ambulatory Visit: Payer: Self-pay | Admitting: Physical Medicine and Rehabilitation

## 2024-08-05 NOTE — Telephone Encounter (Signed)
 error

## 2024-08-19 ENCOUNTER — Ambulatory Visit: Payer: MEDICAID

## 2024-08-19 ENCOUNTER — Ambulatory Visit: Payer: MEDICAID | Attending: Physical Medicine and Rehabilitation | Admitting: Occupational Therapy

## 2024-08-19 ENCOUNTER — Other Ambulatory Visit: Payer: Self-pay

## 2024-08-19 DIAGNOSIS — M5459 Other low back pain: Secondary | ICD-10-CM

## 2024-08-19 DIAGNOSIS — M25522 Pain in left elbow: Secondary | ICD-10-CM | POA: Diagnosis present

## 2024-08-19 DIAGNOSIS — R208 Other disturbances of skin sensation: Secondary | ICD-10-CM | POA: Insufficient documentation

## 2024-08-19 DIAGNOSIS — M6281 Muscle weakness (generalized): Secondary | ICD-10-CM | POA: Insufficient documentation

## 2024-08-19 DIAGNOSIS — M25562 Pain in left knee: Secondary | ICD-10-CM
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
## 2024-08-24 ENCOUNTER — Ambulatory Visit: Admitting: Physical Therapy

## 2024-08-24 ENCOUNTER — Encounter: Payer: Self-pay | Admitting: Physical Therapy

## 2024-08-24 ENCOUNTER — Ambulatory Visit

## 2024-08-24 DIAGNOSIS — M6281 Muscle weakness (generalized): Secondary | ICD-10-CM

## 2024-08-24 DIAGNOSIS — M25562 Pain in left knee: Secondary | ICD-10-CM

## 2024-08-24 DIAGNOSIS — M5459 Other low back pain: Secondary | ICD-10-CM

## 2024-08-24 DIAGNOSIS — M25522 Pain in left elbow: Secondary | ICD-10-CM | POA: Diagnosis not present

## 2024-08-24 DIAGNOSIS — R208 Other disturbances of skin sensation: Secondary | ICD-10-CM
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
## 2024-08-30 ENCOUNTER — Ambulatory Visit: Attending: Physical Medicine and Rehabilitation | Admitting: Occupational Therapy

## 2024-08-30 ENCOUNTER — Ambulatory Visit

## 2024-08-30 DIAGNOSIS — M25522 Pain in left elbow: Secondary | ICD-10-CM | POA: Insufficient documentation

## 2024-08-30 DIAGNOSIS — R208 Other disturbances of skin sensation: Secondary | ICD-10-CM | POA: Diagnosis present

## 2024-08-30 DIAGNOSIS — M5459 Other low back pain: Secondary | ICD-10-CM | POA: Insufficient documentation

## 2024-08-30 DIAGNOSIS — M25562 Pain in left knee: Secondary | ICD-10-CM | POA: Diagnosis present

## 2024-08-30 DIAGNOSIS — M6281 Muscle weakness (generalized): Secondary | ICD-10-CM | POA: Insufficient documentation
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
## 2024-08-31 NOTE — Progress Notes (Signed)
  Subjective:     Patient ID:  Douglas Sloan is a 43 y.o. (DOB [birth year]) male.   Douglas Sloan returns today for follow up of his sleep apnea. He was last seen on 09/09/23.  His baseline nocturnal polysomnogram revealed an AHI of 30.  Since our last visit the patient has been using his CPAP machine 77% of the time for an average use of 7 hours and 34 minutes with 67% of his usage being for over 4 hours. He is cleaning his machine and changing the filters as directed. He reports that the nights he misses her typically nights he has fallen asleep before putting on his mask.  Download data revealed an AHI of 4 at a pressure of 5-16 cmH2O. We have reviewed this data in the office together.  His BMI was 33.6 at the time of his baseline study and is unchanged today.  There have been no new interval medical problems.  Douglas Sloan 1981-04-06 male  How likely are you to doze off or fall asleep in the following situations?  0 = would never   1 = slight chance   2 = moderate chance   3 = high chance  Ask Patient: How likely are you to doze off or fall asleep... Score   while sitting and reading? 1  while watching TV? 3  while in a theatre or meeting? 3  while traveling as a passenger? 1  while resting in the afternoon? 2  while sitting and talking to someone? 2  while sitting quietly after a meal? 2  while sitting in a car stopped in traffic? 0   Past Medical History, Past Surgery History, Allergies, Social History, and Family History were reviewed and updated as appropriate.  Objective:   BP 138/78   Resp 16   Ht 5' 11 (1.803 m)   Wt 240 lb (108.9 kg)   BMI 33.47 kg/m   General  General Appearance - Cooperative. Not in acute distress.   Chest and Lung Exam  Inspection:  Chest Wall: - Normal.  Shape - Normal and Symmetric. Movements - Symmetrical. Accessory muscles - No use of accessory muscles in breathing.  Auscultation:  Breath sounds: - Normal.   Adventitious sounds: - No Adventitious sounds.   Cardiovascular  Inspection: Carotid artery - Bilateral - Inspection Normal.  Auscultation: Rhythm - Regular. Heart Sounds - S1 WNL and S2 WNL. No S3.  Murmurs & Other Heart Sounds: Auscultation of the heart reveals - No Murmurs.  Carotid arteries - No Carotid bruit.  Assessment / Plan:   Encounter Diagnoses  Code Name Primary?  . G47.33 OSA on CPAP Yes  . Z68.33 BMI 33.0-33.9,adult     The patient is compliant with CPAP therapy and benefiting from therapy.   A prescription for CPAP supplies was provided with ongoing CPAP data monitoring.  I have emphasized the importance of using his CPAP mask every night for as long as possible.

## 2024-09-02 ENCOUNTER — Ambulatory Visit

## 2024-09-02 ENCOUNTER — Ambulatory Visit: Admitting: Occupational Therapy

## 2024-09-02 DIAGNOSIS — R208 Other disturbances of skin sensation: Secondary | ICD-10-CM

## 2024-09-02 DIAGNOSIS — M6281 Muscle weakness (generalized): Secondary | ICD-10-CM

## 2024-09-02 DIAGNOSIS — M25522 Pain in left elbow: Secondary | ICD-10-CM | POA: Diagnosis not present

## 2024-09-02 DIAGNOSIS — M5459 Other low back pain: Secondary | ICD-10-CM

## 2024-09-02 DIAGNOSIS — M25562 Pain in left knee: Secondary | ICD-10-CM
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
## 2024-09-05 ENCOUNTER — Ambulatory Visit: Admitting: Occupational Therapy

## 2024-09-05 ENCOUNTER — Ambulatory Visit

## 2024-09-06 ENCOUNTER — Ambulatory Visit: Admitting: Occupational Therapy

## 2024-09-06 ENCOUNTER — Ambulatory Visit
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
## 2024-09-07 ENCOUNTER — Ambulatory Visit: Admitting: Occupational Therapy

## 2024-09-07 ENCOUNTER — Ambulatory Visit: Admitting: Physical Therapy

## 2024-09-08 ENCOUNTER — Ambulatory Visit: Admitting: Occupational Therapy

## 2024-09-08 ENCOUNTER — Ambulatory Visit
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
## 2024-09-12 ENCOUNTER — Ambulatory Visit

## 2024-09-12 ENCOUNTER — Ambulatory Visit: Admitting: Occupational Therapy

## 2024-09-12 DIAGNOSIS — M25562 Pain in left knee: Secondary | ICD-10-CM

## 2024-09-12 DIAGNOSIS — M25522 Pain in left elbow: Secondary | ICD-10-CM | POA: Diagnosis not present

## 2024-09-12 DIAGNOSIS — M5459 Other low back pain: Secondary | ICD-10-CM

## 2024-09-12 DIAGNOSIS — M6281 Muscle weakness (generalized): Secondary | ICD-10-CM
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
## 2024-09-14 ENCOUNTER — Ambulatory Visit: Admitting: Occupational Therapy

## 2024-09-14 ENCOUNTER — Ambulatory Visit
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
## 2024-09-19 ENCOUNTER — Ambulatory Visit: Admitting: Occupational Therapy

## 2024-09-19 ENCOUNTER — Ambulatory Visit

## 2024-09-19 DIAGNOSIS — M25522 Pain in left elbow: Secondary | ICD-10-CM | POA: Diagnosis not present

## 2024-09-19 DIAGNOSIS — R208 Other disturbances of skin sensation: Secondary | ICD-10-CM

## 2024-09-19 DIAGNOSIS — M6281 Muscle weakness (generalized): Secondary | ICD-10-CM
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
## 2024-09-21 ENCOUNTER — Ambulatory Visit

## 2024-09-21 ENCOUNTER — Ambulatory Visit: Admitting: Occupational Therapy

## 2024-09-21 DIAGNOSIS — M25522 Pain in left elbow: Secondary | ICD-10-CM | POA: Diagnosis not present

## 2024-09-21 DIAGNOSIS — M6281 Muscle weakness (generalized): Secondary | ICD-10-CM

## 2024-09-21 DIAGNOSIS — M5459 Other low back pain: Secondary | ICD-10-CM

## 2024-09-21 DIAGNOSIS — R208 Other disturbances of skin sensation: Secondary | ICD-10-CM

## 2024-09-21 DIAGNOSIS — M25562 Pain in left knee: Secondary | ICD-10-CM
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
## 2024-09-26 ENCOUNTER — Ambulatory Visit: Admitting: Occupational Therapy

## 2024-09-26 ENCOUNTER — Ambulatory Visit: Attending: Physical Medicine and Rehabilitation

## 2024-09-26 DIAGNOSIS — M6281 Muscle weakness (generalized): Secondary | ICD-10-CM

## 2024-09-26 DIAGNOSIS — M25562 Pain in left knee: Secondary | ICD-10-CM | POA: Diagnosis present

## 2024-09-26 DIAGNOSIS — R208 Other disturbances of skin sensation: Secondary | ICD-10-CM | POA: Insufficient documentation

## 2024-09-26 DIAGNOSIS — M25522 Pain in left elbow: Secondary | ICD-10-CM | POA: Insufficient documentation

## 2024-09-26 DIAGNOSIS — M5459 Other low back pain: Secondary | ICD-10-CM | POA: Diagnosis present
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
## 2024-09-28 ENCOUNTER — Ambulatory Visit
Admission: RE | Admit: 2024-09-28 | Discharge: 2024-09-28 | Disposition: A | Source: Ambulatory Visit | Attending: Physical Medicine and Rehabilitation

## 2024-09-28 ENCOUNTER — Other Ambulatory Visit: Payer: Self-pay | Admitting: Physical Medicine and Rehabilitation

## 2024-09-28 ENCOUNTER — Encounter: Payer: Self-pay | Admitting: Physical Medicine and Rehabilitation

## 2024-09-28 ENCOUNTER — Encounter: Payer: MEDICAID | Attending: Physical Medicine and Rehabilitation | Admitting: Physical Medicine and Rehabilitation

## 2024-09-28 VITALS — BP 130/89 | HR 89 | Ht 71.0 in | Wt 252.0 lb

## 2024-09-28 DIAGNOSIS — M5417 Radiculopathy, lumbosacral region: Secondary | ICD-10-CM | POA: Diagnosis not present

## 2024-09-28 DIAGNOSIS — M25522 Pain in left elbow: Secondary | ICD-10-CM | POA: Insufficient documentation

## 2024-09-28 DIAGNOSIS — M7651 Patellar tendinitis, right knee: Secondary | ICD-10-CM

## 2024-09-28 DIAGNOSIS — G5622 Lesion of ulnar nerve, left upper limb: Secondary | ICD-10-CM

## 2024-09-28 DIAGNOSIS — G894 Chronic pain syndrome: Secondary | ICD-10-CM

## 2024-09-28 DIAGNOSIS — M461 Sacroiliitis, not elsewhere classified: Secondary | ICD-10-CM | POA: Insufficient documentation

## 2024-09-28 DIAGNOSIS — M7652 Patellar tendinitis, left knee: Secondary | ICD-10-CM | POA: Diagnosis present

## 2024-09-28 MED ORDER — ACETAMINOPHEN-CODEINE 300-30 MG PO TABS: 1.0000 | ORAL_TABLET | Freq: Three times a day (TID) | ORAL | 0 refills | Status: DC | PRN

## 2024-09-28 NOTE — Patient Instructions (Addendum)
 [facility]  [address]  [place], [place] [address]  [phone]  Rosalea Prentice Feil, MD    Please schedule a follow up with Orhtopedics as above for your elbow; bring with you results from EMG with Dr Carilyn tomorrow.  I will re-address possible R S1 epidural with Dr Carilyn.   Tramadol  ineffective, failed gabapentin , lyrica  not covered by insurance, psych medications managed by another provider; try tylenol  with codiene short course #30 tabs total over the next month.  Get xray of your right knee and obtain a  knee brace as discussed prior to support your walking. We will consider steroid injections pending intervention in the elbow as above.   Follow up via telehealth in 1 month

## 2024-09-29 ENCOUNTER — Encounter: Payer: MEDICAID | Admitting: Physical Medicine & Rehabilitation

## 2024-10-03 ENCOUNTER — Telehealth: Payer: Self-pay

## 2024-10-03 DIAGNOSIS — M461 Sacroiliitis, not elsewhere classified: Secondary | ICD-10-CM

## 2024-10-03 DIAGNOSIS — G5622 Lesion of ulnar nerve, left upper limb: Secondary | ICD-10-CM

## 2024-10-03 DIAGNOSIS — G894 Chronic pain syndrome: Secondary | ICD-10-CM

## 2024-10-03 DIAGNOSIS — M7652 Patellar tendinitis, left knee: Secondary | ICD-10-CM

## 2024-10-03 NOTE — Telephone Encounter (Signed)
 [facility] is requesting dx code on Acetaminophen  w/codiene 300-30 mg prescription. Can you resend?

## 2024-10-04 ENCOUNTER — Ambulatory Visit: Payer: Self-pay | Admitting: Physical Medicine and Rehabilitation

## 2024-10-04 MED ORDER — ACETAMINOPHEN-CODEINE 300-30 MG PO TABS: 1.0000 | ORAL_TABLET | Freq: Three times a day (TID) | ORAL | 0 refills | Status: DC | PRN

## 2024-10-11 DIAGNOSIS — M25561 Pain in right knee: Secondary | ICD-10-CM | POA: Insufficient documentation

## 2024-10-14 ENCOUNTER — Ambulatory Visit: Admitting: Sports Medicine

## 2024-10-14 VITALS — BP 132/80 | HR 87 | Ht 71.0 in | Wt 249.0 lb

## 2024-10-14 DIAGNOSIS — M7651 Patellar tendinitis, right knee: Secondary | ICD-10-CM

## 2024-10-14 DIAGNOSIS — M25562 Pain in left knee: Secondary | ICD-10-CM

## 2024-10-14 DIAGNOSIS — G8929 Other chronic pain: Secondary | ICD-10-CM

## 2024-10-14 DIAGNOSIS — M25561 Pain in right knee: Secondary | ICD-10-CM | POA: Diagnosis not present

## 2024-10-14 DIAGNOSIS — M1732 Unilateral post-traumatic osteoarthritis, left knee: Secondary | ICD-10-CM

## 2024-10-14 MED ORDER — MELOXICAM 15 MG PO TABS: ORAL_TABLET | ORAL | 0 refills | Status: AC
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
## 2024-10-14 NOTE — Patient Instructions (Signed)
-   Start meloxicam  15 mg daily x2 weeks.  If still having pain after 2 weeks, complete 3rd-week of NSAID. May use remaining NSAID as needed once daily for pain control.  Do not to use additional over-the-counter NSAIDs (ibuprofen , naproxen , Advil , Aleve , etc.) while taking prescription NSAIDs.  May use Tylenol  [phone] mg 2 to 3 times a day for breakthrough pain.  Knee HEP   6 week follow up
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
## 2024-10-31 ENCOUNTER — Encounter: Admitting: Physical Medicine and Rehabilitation

## 2024-11-19 ENCOUNTER — Telehealth: Payer: Self-pay

## 2024-11-19 NOTE — Telephone Encounter (Signed)
 Douglas Sloan called:   For the past week the left elbow pain has increased. The pain level is a 10 out of 10 with all activities. It even hurts to lay on the elbow.  He also complained of pain shooting down his back down to the right foot.  Patient had to cancel his appointment on Monday 11/21/2024. Because his flight does not arrive in [place] until after 5 pm on 11/21/2024.  Call back phone [phone].

## 2024-11-21 ENCOUNTER — Encounter: Admitting: Physical Medicine and Rehabilitation

## 2024-11-22 ENCOUNTER — Encounter: Admitting: Physical Medicine & Rehabilitation

## 2024-11-23 ENCOUNTER — Encounter: Attending: Physical Medicine and Rehabilitation | Admitting: Physical Medicine and Rehabilitation

## 2024-11-23 VITALS — BP 150/91 | HR 102 | Ht 71.0 in | Wt 254.0 lb

## 2024-11-23 DIAGNOSIS — M7918 Myalgia, other site: Secondary | ICD-10-CM | POA: Insufficient documentation

## 2024-11-23 DIAGNOSIS — G5622 Lesion of ulnar nerve, left upper limb: Secondary | ICD-10-CM | POA: Insufficient documentation

## 2024-11-23 DIAGNOSIS — B349 Viral infection, unspecified: Secondary | ICD-10-CM | POA: Insufficient documentation

## 2024-11-23 DIAGNOSIS — G8929 Other chronic pain: Secondary | ICD-10-CM | POA: Insufficient documentation

## 2024-11-23 DIAGNOSIS — H672 Otitis media in diseases classified elsewhere, left ear: Secondary | ICD-10-CM | POA: Insufficient documentation

## 2024-11-23 DIAGNOSIS — G894 Chronic pain syndrome: Secondary | ICD-10-CM | POA: Insufficient documentation

## 2024-11-23 DIAGNOSIS — M25561 Pain in right knee: Secondary | ICD-10-CM | POA: Insufficient documentation

## 2024-11-23 DIAGNOSIS — M7661 Achilles tendinitis, right leg: Secondary | ICD-10-CM | POA: Insufficient documentation

## 2024-11-23 DIAGNOSIS — M5442 Lumbago with sciatica, left side: Secondary | ICD-10-CM | POA: Insufficient documentation

## 2024-11-23 MED ORDER — CLARITIN 5 MG PO CHEW: 5.0000 mg | CHEWABLE_TABLET | Freq: Every day | ORAL | 0 refills | Status: AC

## 2024-11-23 MED ORDER — FLUTICASONE PROPIONATE 50 MCG/ACT NA SUSP: 1.0000 | Freq: Every day | NASAL | 0 refills | Status: AC
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
## 2024-11-23 NOTE — Patient Instructions (Signed)
" °  VISIT SUMMARY: During your visit, we discussed your ongoing musculoskeletal issues including left ulnar neuropathy, right knee pain, and right Achilles tendinitis. We also addressed your recent ENT symptoms. We have made referrals and provided recommendations to help manage your conditions.  YOUR PLAN: ULNAR NEUROPATHY OF LEFT UPPER EXTREMITY: You have chronic left elbow pain consistent with ulnar nerve entrapment at the cubital tunnel, which has not responded to conservative treatments. -You have been referred to orthopedic surgery (Doctor Do) for evaluation and potential surgical intervention. Dr. Rosalea  [facility]  [address]  [place], [place] [address]  [phone]   -Doctor Do may expedite an EMG if needed; keep appointment with Dr Carilyn for now  RIGHT KNEE PAIN: You have chronic right knee pain that persists despite taking meloxicam  and limited adherence to your home exercise program due to discomfort. -You need to adhere to the prescribed home exercise program (2-3 times daily, 3-5 times per week for 6-12 weeks). -Please resume your home exercise program before your sports medicine appointment. -You have a follow-up appointment with sports medicine (Doctor [place]) in two days.  RIGHT ACHILLES TENDINITIS: You have right Achilles pain likely worsened by your altered gait from knee pain, and it appears to be tendinitis. -Sports medicine (Doctor Leonce) has been messaged to consider an ultrasound evaluation of your Achilles at your upcoming appointment. -Use your lace-up ankle brace for stability and symptom relief. -Continue with your current conservative management (meloxicam , physical therapy).  CHRONIC PAIN SYNDROME: You have chronic pain syndrome with multiple musculoskeletal complaints and limited benefit from medications. You prefer to minimize medication use and are open to surgical options if needed. -You have been  referred to appropriate specialists for further management. -A telehealth follow-up has been scheduled in six months to reassess your management needs.  L EAR FULLNESS: You have recent throat irritation, nasal fullness with swallowing, and persistent aural fullness after  travel and a flight. These symptoms started after recent travel but have not affected your participation in therapy or mobility activities. Mild L inner ear erythema on exam.  - Claritin  5 mg daily and flonase  1 spray each nasal passage over the counter for 5 days  - Go to urgent care of your PCP if develop fever or worsening symptoms -     Contains text generated by [employer].   "
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
## 2024-11-25 ENCOUNTER — Ambulatory Visit: Admitting: Sports Medicine

## 2024-11-25 ENCOUNTER — Ambulatory Visit

## 2024-11-25 VITALS — BP 124/82 | HR 83 | Ht 71.0 in | Wt 242.0 lb

## 2024-11-25 DIAGNOSIS — M1732 Unilateral post-traumatic osteoarthritis, left knee: Secondary | ICD-10-CM

## 2024-11-25 DIAGNOSIS — M7661 Achilles tendinitis, right leg: Secondary | ICD-10-CM

## 2024-11-25 DIAGNOSIS — M25522 Pain in left elbow: Secondary | ICD-10-CM

## 2024-11-25 DIAGNOSIS — M25561 Pain in right knee: Secondary | ICD-10-CM | POA: Diagnosis not present

## 2024-11-25 DIAGNOSIS — M25562 Pain in left knee: Secondary | ICD-10-CM | POA: Diagnosis not present

## 2024-11-25 DIAGNOSIS — M7651 Patellar tendinitis, right knee: Secondary | ICD-10-CM | POA: Diagnosis not present

## 2024-11-25 DIAGNOSIS — M7712 Lateral epicondylitis, left elbow: Secondary | ICD-10-CM

## 2024-11-25 DIAGNOSIS — G8929 Other chronic pain: Secondary | ICD-10-CM

## 2024-11-25 MED ORDER — METHYLPREDNISOLONE 4 MG PO TBPK: ORAL_TABLET | ORAL | 0 refills | Status: AC

## 2024-11-25 NOTE — Patient Instructions (Addendum)
 Xrays on the way out   Elbow HEP   PT referral   Prednisone  dosepak   MRI left elbow   Call and follow up 1 week after MRI to discus results. If MRI is not approved follow up in 6 weeks

## 2024-11-28 ENCOUNTER — Ambulatory Visit: Payer: Self-pay | Admitting: Sports Medicine

## 2024-12-01 ENCOUNTER — Other Ambulatory Visit: Payer: Self-pay | Admitting: Sports Medicine

## 2024-12-01 DIAGNOSIS — M25522 Pain in left elbow: Secondary | ICD-10-CM

## 2024-12-01 DIAGNOSIS — M7712 Lateral epicondylitis, left elbow: Secondary | ICD-10-CM

## 2024-12-02 ENCOUNTER — Inpatient Hospital Stay: Admission: RE | Admit: 2024-12-02

## 2024-12-02 DIAGNOSIS — M25522 Pain in left elbow: Secondary | ICD-10-CM

## 2024-12-02 DIAGNOSIS — M7712 Lateral epicondylitis, left elbow: Secondary | ICD-10-CM

## 2024-12-08 ENCOUNTER — Ambulatory Visit: Attending: Physical Medicine and Rehabilitation

## 2025-01-03 ENCOUNTER — Encounter: Admitting: Physical Medicine & Rehabilitation

## 2025-05-25 ENCOUNTER — Encounter: Admitting: Physical Medicine and Rehabilitation
